# Patient Record
Sex: Female | Born: 1975 | Race: Black or African American | Hispanic: No | Marital: Married | Smoking: Never smoker
Health system: Southern US, Community
[De-identification: ages and names within clinical notes are randomized; demographics above are authoritative.]

## PROBLEM LIST (undated history)

## (undated) DIAGNOSIS — I509 Heart failure, unspecified: Secondary | ICD-10-CM

## (undated) DIAGNOSIS — E785 Hyperlipidemia, unspecified: Secondary | ICD-10-CM

## (undated) DIAGNOSIS — D689 Coagulation defect, unspecified: Secondary | ICD-10-CM

## (undated) DIAGNOSIS — E119 Type 2 diabetes mellitus without complications: Secondary | ICD-10-CM

## (undated) DIAGNOSIS — I428 Other cardiomyopathies: Secondary | ICD-10-CM

## (undated) DIAGNOSIS — F32A Depression, unspecified: Secondary | ICD-10-CM

## (undated) DIAGNOSIS — F329 Major depressive disorder, single episode, unspecified: Secondary | ICD-10-CM

## (undated) DIAGNOSIS — D649 Anemia, unspecified: Secondary | ICD-10-CM

## (undated) HISTORY — DX: Depression, unspecified: F32.A

## (undated) HISTORY — PX: OTHER SURGICAL HISTORY: SHX169

## (undated) HISTORY — DX: Other cardiomyopathies: I42.8

## (undated) HISTORY — DX: Anemia, unspecified: D64.9

## (undated) HISTORY — DX: Heart failure, unspecified: I50.9

## (undated) HISTORY — DX: Major depressive disorder, single episode, unspecified: F32.9

## (undated) HISTORY — DX: Coagulation defect, unspecified: D68.9

## (undated) HISTORY — DX: Hyperlipidemia, unspecified: E78.5

## (undated) HISTORY — PX: CHOLECYSTECTOMY: SHX55

## (undated) HISTORY — DX: Type 2 diabetes mellitus without complications: E11.9

---

## 1998-12-15 ENCOUNTER — Emergency Department (HOSPITAL_COMMUNITY): Admission: EM | Admit: 1998-12-15 | Discharge: 1998-12-16 | Payer: Self-pay | Admitting: Emergency Medicine

## 2000-11-29 ENCOUNTER — Encounter: Admission: RE | Admit: 2000-11-29 | Discharge: 2000-11-29 | Payer: Self-pay | Admitting: *Deleted

## 2000-12-27 ENCOUNTER — Encounter: Payer: Self-pay | Admitting: Surgery

## 2000-12-27 ENCOUNTER — Ambulatory Visit (HOSPITAL_COMMUNITY): Admission: RE | Admit: 2000-12-27 | Discharge: 2000-12-27 | Payer: Self-pay | Admitting: Surgery

## 2001-06-08 ENCOUNTER — Emergency Department (HOSPITAL_COMMUNITY): Admission: EM | Admit: 2001-06-08 | Discharge: 2001-06-08 | Payer: Self-pay | Admitting: Emergency Medicine

## 2001-06-09 ENCOUNTER — Emergency Department (HOSPITAL_COMMUNITY): Admission: EM | Admit: 2001-06-09 | Discharge: 2001-06-09 | Payer: Self-pay | Admitting: Emergency Medicine

## 2001-08-08 ENCOUNTER — Other Ambulatory Visit: Admission: RE | Admit: 2001-08-08 | Discharge: 2001-08-08 | Payer: Self-pay | Admitting: Family Medicine

## 2002-01-02 ENCOUNTER — Encounter: Payer: Self-pay | Admitting: Emergency Medicine

## 2002-01-02 ENCOUNTER — Inpatient Hospital Stay (HOSPITAL_COMMUNITY): Admission: EM | Admit: 2002-01-02 | Discharge: 2002-01-10 | Payer: Self-pay | Admitting: Emergency Medicine

## 2002-01-06 HISTORY — PX: CARDIAC CATHETERIZATION: SHX172

## 2002-01-20 ENCOUNTER — Encounter: Admission: RE | Admit: 2002-01-20 | Discharge: 2002-04-20 | Payer: Self-pay | Admitting: *Deleted

## 2002-01-21 ENCOUNTER — Encounter: Payer: Self-pay | Admitting: Family Medicine

## 2002-01-21 ENCOUNTER — Ambulatory Visit (HOSPITAL_COMMUNITY): Admission: RE | Admit: 2002-01-21 | Discharge: 2002-01-21 | Payer: Self-pay | Admitting: Family Medicine

## 2002-01-22 ENCOUNTER — Inpatient Hospital Stay (HOSPITAL_COMMUNITY): Admission: EM | Admit: 2002-01-22 | Discharge: 2002-01-30 | Payer: Self-pay

## 2002-01-22 ENCOUNTER — Encounter: Payer: Self-pay | Admitting: *Deleted

## 2002-01-23 ENCOUNTER — Encounter: Payer: Self-pay | Admitting: *Deleted

## 2002-01-27 ENCOUNTER — Encounter: Payer: Self-pay | Admitting: *Deleted

## 2002-03-06 ENCOUNTER — Emergency Department (HOSPITAL_COMMUNITY): Admission: EM | Admit: 2002-03-06 | Discharge: 2002-03-06 | Payer: Self-pay | Admitting: Emergency Medicine

## 2002-03-09 ENCOUNTER — Ambulatory Visit (HOSPITAL_COMMUNITY): Admission: RE | Admit: 2002-03-09 | Discharge: 2002-03-09 | Payer: Self-pay | Admitting: Family Medicine

## 2002-03-09 ENCOUNTER — Encounter: Payer: Self-pay | Admitting: Family Medicine

## 2002-11-20 ENCOUNTER — Ambulatory Visit: Admission: RE | Admit: 2002-11-20 | Discharge: 2002-11-20 | Payer: Self-pay | Admitting: Pulmonary Disease

## 2003-01-01 ENCOUNTER — Ambulatory Visit: Admission: RE | Admit: 2003-01-01 | Discharge: 2003-01-01 | Payer: Self-pay | Admitting: Pulmonary Disease

## 2003-03-17 ENCOUNTER — Ambulatory Visit: Admission: RE | Admit: 2003-03-17 | Discharge: 2003-03-17 | Payer: Self-pay | Admitting: Pulmonary Disease

## 2012-12-25 ENCOUNTER — Ambulatory Visit (INDEPENDENT_AMBULATORY_CARE_PROVIDER_SITE_OTHER): Payer: Medicare HMO | Admitting: Internal Medicine

## 2012-12-25 ENCOUNTER — Encounter: Payer: Self-pay | Admitting: Internal Medicine

## 2012-12-25 VITALS — BP 130/80 | HR 108 | Ht 62.0 in | Wt 208.8 lb

## 2012-12-25 DIAGNOSIS — I428 Other cardiomyopathies: Secondary | ICD-10-CM

## 2012-12-25 DIAGNOSIS — I10 Essential (primary) hypertension: Secondary | ICD-10-CM

## 2012-12-25 DIAGNOSIS — I2699 Other pulmonary embolism without acute cor pulmonale: Secondary | ICD-10-CM

## 2012-12-25 DIAGNOSIS — Z79899 Other long term (current) drug therapy: Secondary | ICD-10-CM

## 2012-12-25 DIAGNOSIS — E782 Mixed hyperlipidemia: Secondary | ICD-10-CM

## 2012-12-25 DIAGNOSIS — E119 Type 2 diabetes mellitus without complications: Secondary | ICD-10-CM

## 2012-12-25 MED ORDER — DIGOXIN 250 MCG PO TABS: 0.2500 mg | ORAL_TABLET | Freq: Every day | ORAL | Status: DC

## 2012-12-25 MED ORDER — SPIRONOLACTONE 25 MG PO TABS: 12.5000 mg | ORAL_TABLET | Freq: Every day | ORAL | Status: DC

## 2012-12-25 MED ORDER — CARVEDILOL 25 MG PO TABS: 25.0000 mg | ORAL_TABLET | Freq: Two times a day (BID) | ORAL | Status: DC

## 2012-12-25 MED ORDER — RAMIPRIL 5 MG PO TABS: 5.0000 mg | ORAL_TABLET | Freq: Two times a day (BID) | ORAL | Status: DC

## 2012-12-25 MED ORDER — HYDROCHLOROTHIAZIDE 25 MG PO TABS: 25.0000 mg | ORAL_TABLET | Freq: Every day | ORAL | Status: DC

## 2012-12-25 MED ORDER — EZETIMIBE-SIMVASTATIN 10-40 MG PO TABS: 1.0000 | ORAL_TABLET | Freq: Every day | ORAL | Status: DC

## 2012-12-25 NOTE — Patient Instructions (Signed)
Follow up with Dr. Rennis Golden in 3 weeks.   Have lab work done before visit. Day of lab work, please nothing to eat or drink before and do not take Digoxin prior.

## 2012-12-26 ENCOUNTER — Encounter: Payer: Self-pay | Admitting: Internal Medicine

## 2012-12-26 DIAGNOSIS — I428 Other cardiomyopathies: Secondary | ICD-10-CM | POA: Insufficient documentation

## 2012-12-26 DIAGNOSIS — E669 Obesity, unspecified: Secondary | ICD-10-CM | POA: Insufficient documentation

## 2012-12-26 DIAGNOSIS — I10 Essential (primary) hypertension: Secondary | ICD-10-CM | POA: Insufficient documentation

## 2012-12-26 DIAGNOSIS — I2699 Other pulmonary embolism without acute cor pulmonale: Secondary | ICD-10-CM | POA: Insufficient documentation

## 2012-12-26 NOTE — Progress Notes (Signed)
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 In 2011, her EF was 45% to 50% by echocardiogram with medical therapy. Unfortunately, after her initial diagnosis, she developed a pulmonary embolus in 2003 and was on Coumadin for about 2 years, and now is on aspirin 325 mg daily. She also has diabetes, hypertension, and obesity. She recently she tripped in her house and developed a small stress fracture and sprain of her left ankle which she has recovered from. She denies any shortness of breath with exertion, chest pain, palpitations, presyncope, syncopal symptoms.  PMHx:  Past Medical History  Diagnosis Date  . NICM (nonischemic cardiomyopathy)     2D ECHO, 12/26/2011 - EF 45-50%, left ventricle borderline dilated    Past Surgical History  Procedure Laterality Date  . Cardiac catheterization  01/06/2002    Dilated nonsichemic cardiopmyopathy, EF 28%, at least 2+ angiographic mitral regurgitation    FAMHx:  Family History  Problem Relation Age of Onset  . Hypertension Mother   . GI Bleed Mother   . Pneumonia Maternal Grandmother   . Cancer Paternal Grandmother     Breast cancer    SOCHx:   reports that she has never smoked. She does not have any smokeless tobacco history on file. She reports that she does not drink alcohol or use illicit drugs.  ALLERGIES:  Allergies  Allergen Reactions  . Betadine (Povidone Iodine)   . Iodine   . Shellfish Allergy     ROS: A comprehensive review of systems was negative except for: Cardiovascular: positive for dyspnea  HOME MEDS: Current Outpatient Prescriptions  Medication Sig Dispense Refill  . aspirin 325 MG tablet Take 325 mg by mouth daily.       . carvedilol (COREG) 25 MG tablet Take 1 tablet (25 mg total) by mouth 2 (two) times daily with a meal.  180 tablet  3  . digoxin (LANOXIN) 0.25 MG tablet Take 1 tablet (0.25 mg total) by mouth daily. Takes 1 tablet a day, and alternate with 1/2 tablet every other day.  90 tablet  3  . ezetimibe-simvastatin (VYTORIN) 10-40 MG per tablet Take 1 tablet by mouth at bedtime.  90 tablet  3  . hydrochlorothiazide (HYDRODIURIL) 25 MG tablet Take 1 tablet (25 mg total) by mouth daily.  90 tablet  3  . Liraglutide (VICTOZA ) Inject 1.2 mg into the skin daily.      Marland Kitchen loratadine (CLARITIN) 10 MG tablet Take 10 mg by mouth daily.      . ramipril (ALTACE) 5 MG tablet Take 1 tablet (5 mg total) by mouth 2 (two) times daily.  180 tablet  3  . sitaGLIPtan-metformin (JANUMET) 50-500 MG per tablet Take 1 tablet by mouth 2 (two) times daily with a meal.      . spironolactone (ALDACTONE) 25 MG tablet Take 0.5 tablets (12.5 mg total) by mouth daily.  45 tablet  3   No current facility-administered medications for this visit.    LABS/IMAGING: No results found for this or any previous visit (from the past 48 hour(s)). No results found.  VITALS: BP 130/80  Pulse 108  Ht 5\' 2"  (1.575 m)  Wt 208 lb 12.8 oz (94.711 kg)  BMI 38.18 kg/m2  EXAM: General appearance: alert and no distress Neck: no adenopathy, no carotid bruit, no JVD, supple, symmetrical, trachea midline and thyroid not enlarged, symmetric, no tenderness/mass/nodules Lungs: clear to auscultation bilaterally Heart: regular rate and rhythm, S1, S2 normal, no murmur, click, rub or gallop Abdomen: OBese Extremities: extremities normal, atraumatic, no cyanosis or edema Pulses: 2+ and symmetric Skin: Skin color, texture, turgor normal. No rashes or lesions Neurologic: Grossly normal  EKG: Sinus tachycardia 108  ASSESSMENT: 1. Nonischemic cardiomyopathy, EF 45-50% 2. History pulmonary embolism 3. Diabetes type  2 4. Hypertension 5. Obesity 6. Tachycardia  PLAN: 1.    [name] is doing fairly well except for tachycardia. She's had difficulty with digoxin levels in the past and I would like to recheck that as well as a lipid profile and metabolic profile. I think she needs an increase in her beta blocker. Likely her back to review her laboratory work with her.  Chrystie Nose, MD, [facility] Attending Cardiologist [facility]  Nailea Whitehorn C 12/26/2012, 7:28 PM

## 2013-01-06 LAB — COMPREHENSIVE METABOLIC PANEL
ALT: 9 U/L (ref 0–35)
AST: 12 U/L (ref 0–37)
BUN: 16 mg/dL (ref 6–23)
CO2: 27 mEq/L (ref 19–32)
Creat: 0.98 mg/dL (ref 0.50–1.10)
Total Bilirubin: 0.3 mg/dL (ref 0.3–1.2)

## 2013-01-06 LAB — LIPID PANEL
HDL: 67 mg/dL (ref >39)
Total CHOL/HDL Ratio: 2.4 Ratio
Triglycerides: 123 mg/dL (ref <150)

## 2013-01-07 LAB — DIGOXIN LEVEL: Digoxin Level: 0.6 ng/mL — ABNORMAL LOW (ref 0.8–2.0)

## 2013-01-15 ENCOUNTER — Ambulatory Visit (INDEPENDENT_AMBULATORY_CARE_PROVIDER_SITE_OTHER): Payer: Medicare HMO | Admitting: Internal Medicine

## 2013-01-15 ENCOUNTER — Encounter: Payer: Self-pay | Admitting: Internal Medicine

## 2013-01-15 VITALS — BP 122/82 | HR 88 | Ht 61.0 in | Wt 208.0 lb

## 2013-01-15 DIAGNOSIS — E669 Obesity, unspecified: Secondary | ICD-10-CM

## 2013-01-15 DIAGNOSIS — E119 Type 2 diabetes mellitus without complications: Secondary | ICD-10-CM

## 2013-01-15 DIAGNOSIS — I10 Essential (primary) hypertension: Secondary | ICD-10-CM

## 2013-01-15 DIAGNOSIS — I428 Other cardiomyopathies: Secondary | ICD-10-CM

## 2013-01-15 DIAGNOSIS — I2699 Other pulmonary embolism without acute cor pulmonale: Secondary | ICD-10-CM

## 2013-01-15 NOTE — Progress Notes (Signed)
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 In 2011, her EF was 45% to 50% by echocardiogram with medical therapy. Unfortunately, after her initial diagnosis, she developed a pulmonary embolus in 2003 and was on Coumadin for about 2 years, and now is on aspirin 325 mg daily. She also has diabetes, hypertension, and obesity. She recently she tripped in her house and developed a small stress fracture and sprain of her left ankle which she has recovered from. She denies any shortness of breath with exertion, chest pain, palpitations, presyncope, syncopal symptoms.  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 She continues to feel at baseline. Interestingly, her heart rate is lower than it was at her last office visit.  PMHx:  Past Medical History  Diagnosis Date  . NICM (nonischemic cardiomyopathy)     2D ECHO, 12/26/2011 - EF 45-50%, left ventricle borderline dilated    Past Surgical History  Procedure Laterality Date  . Cardiac catheterization  01/06/2002    Dilated nonsichemic cardiopmyopathy, EF 28%, at least 2+ angiographic mitral regurgitation    FAMHx:  Family History  Problem Relation Age of Onset  . Hypertension Mother   . GI Bleed Mother   . Pneumonia Maternal Grandmother   . Cancer Paternal Grandmother     Breast cancer    SOCHx:   reports that she has never smoked. She does not have any smokeless tobacco history on file. She reports that she does not drink alcohol or use illicit drugs.  ALLERGIES:  Allergies  Allergen Reactions  . Betadine (Povidone Iodine)   . Iodine   . Shellfish Allergy     ROS: A comprehensive review of systems was negative except for:  Cardiovascular: positive for dyspnea  HOME MEDS: Current Outpatient Prescriptions  Medication Sig Dispense Refill  . aspirin 325 MG tablet Take 325 mg by mouth daily.      . carvedilol (COREG) 25 MG tablet Take 1 tablet (25 mg total) by mouth 2 (two) times daily with a meal.  180 tablet  3  . digoxin (LANOXIN) 0.25 MG tablet Take 1 tablet (0.25 mg total) by mouth daily. Takes 1 tablet a day, and alternate with 1/2 tablet every other day.  90 tablet  3  . diphenhydrAMINE (BENADRYL) 25 MG tablet Take 25 mg by mouth daily.      Marland Kitchen ezetimibe-simvastatin (VYTORIN) 10-40 MG per tablet Take 1 tablet by mouth at bedtime.  90 tablet  3  . hydrochlorothiazide (HYDRODIURIL) 25 MG tablet Take 1 tablet (25 mg total) by mouth daily.  90 tablet  3  . Liraglutide (VICTOZA [place]) Inject 1.2 mg into the skin daily.      Marland Kitchen loratadine (CLARITIN) 10 MG tablet Take 10 mg by mouth daily.      . ramipril (ALTACE) 5 MG tablet Take 1 tablet (5 mg total) by mouth 2 (two) times daily.  180 tablet  3  . sitaGLIPtan-metformin (JANUMET) 50-500 MG per tablet Take 1 tablet by mouth 2 (two) times daily with a meal.      . spironolactone (ALDACTONE) 25 MG tablet Take 0.5 tablets (12.5 mg total) by mouth daily.  45 tablet  3   No current facility-administered medications for this visit.  LABS/IMAGING: No results found for this or any previous visit (from the past 48 hour(s)). No results found.  VITALS: BP 122/82  Pulse 88  Ht 5\' 1"  (1.549 m)  Wt 208 lb (94.348 kg)  BMI 39.32 kg/m2  EXAM: General appearance: alert and no distress Neck: no adenopathy, no carotid bruit, no JVD, supple, symmetrical, trachea midline and thyroid not enlarged, symmetric, no tenderness/mass/nodules Lungs: clear to auscultation bilaterally Heart: regular rate and rhythm, S1, S2 normal, no murmur, click, rub or gallop Abdomen: OBese Extremities: extremities normal, atraumatic, no cyanosis or edema Pulses: 2+ and symmetric Skin: Skin color,  texture, turgor normal. No rashes or lesions Neurologic: Grossly normal  EKG: deferred  ASSESSMENT: 1. Nonischemic cardiomyopathy, EF 45-50% 2. History pulmonary embolism 3. Diabetes type 2 4. Hypertension 5. Obesity 6. Tachycardia - improved  PLAN: 1.    Laurie Small tachycardia is improved today. She did undergo laboratory work which shows a well controlled lipid profile. Her digoxin level was 0.6 which is at goal. She did have an elevated blood sugar greater than 260. She tells me that she was recently started on the toes in addition to JB men and is working on dietary changes. Blood sugar control, exercise and weight loss should be her top priorities. Not feel strongly about adjusting her medications today as her heart rate and blood pressure are at goal. We'll plan to see her back yearly or sooner as necessary.  Laurie Nose, MD, [facility] Attending Cardiologist [facility]  Laurie Small 01/15/2013, 11:50 AM

## 2013-01-15 NOTE — Patient Instructions (Signed)
Follow-up annually

## 2013-01-16 ENCOUNTER — Encounter: Payer: Self-pay | Admitting: *Deleted

## 2013-03-18 ENCOUNTER — Other Ambulatory Visit: Payer: Self-pay | Admitting: *Deleted

## 2013-03-18 MED ORDER — EZETIMIBE-SIMVASTATIN 10-40 MG PO TABS: 1.0000 | ORAL_TABLET | Freq: Every day | ORAL | Status: DC

## 2013-03-31 ENCOUNTER — Telehealth: Payer: Self-pay | Admitting: Internal Medicine

## 2013-03-31 NOTE — Telephone Encounter (Signed)
Want to see if she can take something els instead of Vytorin? It is too expensive.

## 2013-03-31 NOTE — Telephone Encounter (Signed)
Message forwarded to Dr. Hilty.  

## 2013-04-01 ENCOUNTER — Other Ambulatory Visit: Payer: Self-pay | Admitting: Internal Medicine

## 2013-04-01 DIAGNOSIS — E785 Hyperlipidemia, unspecified: Secondary | ICD-10-CM

## 2013-04-01 MED ORDER — ATORVASTATIN CALCIUM 40 MG PO TABS: 40.0000 mg | ORAL_TABLET | Freq: Every day | ORAL | Status: DC

## 2013-04-01 NOTE — Telephone Encounter (Signed)
Spoke with patient today - will switch to atorvastatin 40 mg.  Dr. Rennis Golden

## 2013-04-01 NOTE — Telephone Encounter (Signed)
Message forwarded to Dr. Hilty.  

## 2013-04-01 NOTE — Telephone Encounter (Signed)
Pt said she called yesterday-never heard from anybody-please call today.

## 2014-02-05 ENCOUNTER — Other Ambulatory Visit: Payer: Self-pay | Admitting: Internal Medicine

## 2014-02-05 NOTE — Telephone Encounter (Signed)
Rx was sent to pharmacy electronically. 

## 2014-02-18 ENCOUNTER — Encounter: Payer: Self-pay | Admitting: Internal Medicine

## 2014-02-18 ENCOUNTER — Ambulatory Visit (INDEPENDENT_AMBULATORY_CARE_PROVIDER_SITE_OTHER): Payer: Medicare HMO | Admitting: Internal Medicine

## 2014-02-18 VITALS — BP 142/80 | HR 92 | Ht 62.0 in | Wt 204.4 lb

## 2014-02-18 DIAGNOSIS — I10 Essential (primary) hypertension: Secondary | ICD-10-CM

## 2014-02-18 DIAGNOSIS — Z79899 Other long term (current) drug therapy: Secondary | ICD-10-CM

## 2014-02-18 DIAGNOSIS — E119 Type 2 diabetes mellitus without complications: Secondary | ICD-10-CM

## 2014-02-18 DIAGNOSIS — E785 Hyperlipidemia, unspecified: Secondary | ICD-10-CM

## 2014-02-18 DIAGNOSIS — I428 Other cardiomyopathies: Secondary | ICD-10-CM

## 2014-02-18 DIAGNOSIS — E669 Obesity, unspecified: Secondary | ICD-10-CM

## 2014-02-18 MED ORDER — RAMIPRIL 5 MG PO CAPS: 5.0000 mg | ORAL_CAPSULE | Freq: Two times a day (BID) | ORAL | Status: DC

## 2014-02-18 MED ORDER — CARVEDILOL 25 MG PO TABS: 25.0000 mg | ORAL_TABLET | Freq: Two times a day (BID) | ORAL | Status: DC

## 2014-02-18 MED ORDER — DIGOXIN 250 MCG PO TABS: 0.2500 mg | ORAL_TABLET | Freq: Every day | ORAL | Status: DC

## 2014-02-18 MED ORDER — SPIRONOLACTONE 25 MG PO TABS: 12.5000 mg | ORAL_TABLET | Freq: Every day | ORAL | Status: DC

## 2014-02-18 MED ORDER — DIGOXIN 250 MCG PO TABS: ORAL_TABLET | ORAL | Status: DC

## 2014-02-18 MED ORDER — HYDROCHLOROTHIAZIDE 25 MG PO TABS: 25.0000 mg | ORAL_TABLET | Freq: Every day | ORAL | Status: DC

## 2014-02-18 MED ORDER — ATORVASTATIN CALCIUM 40 MG PO TABS: 40.0000 mg | ORAL_TABLET | Freq: Every day | ORAL | Status: DC

## 2014-02-18 NOTE — Patient Instructions (Signed)
Your physician has requested that you have an echocardiogram. Echocardiography is a painless test that uses sound waves to create images of your heart. It provides your doctor with information about the size and shape of your heart and how well your heart's chambers and valves are working. This procedure takes approximately one hour. There are no restrictions for this procedure.  Your physician recommends that you return for lab work at your convenience.   Your physician wants you to follow-up in: 6 months. You will receive a reminder letter in the mail two months in advance. If you don't receive a letter, please call our office to schedule the follow-up appointment.

## 2014-02-19 ENCOUNTER — Encounter: Payer: Self-pay | Admitting: Internal Medicine

## 2014-02-19 NOTE — Progress Notes (Signed)
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 In 2011, her EF was 45% to 50% by echocardiogram with medical therapy. Unfortunately, after her initial diagnosis, she developed a pulmonary embolus in 2003 and was on Coumadin for about 2 years, and now is on aspirin 325 mg daily. She also has diabetes, hypertension, and obesity. She recently she tripped in her house and developed a small stress fracture and sprain of her left ankle which she has recovered from. She denies any shortness of breath with exertion, chest pain, palpitations, presyncope, syncopal symptoms.  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 She continues to feel at baseline. Interestingly, her heart rate is lower than it was at her last office visit.  Laurie Small returns today for followup. She reports doing well denies any shortness of breath or chest pain with exertion. Unfortunately she's not been able to lose any significant amount of weight. She has had some adjustments in her diabetes medicine with the addition of glipizide, she is also taking Onglyza and metformin 1000 mg twice daily.  She is interested in possibly getting off of some of her medications.  PMHx:  Past Medical History  Diagnosis Date  . NICM (nonischemic cardiomyopathy)     2D ECHO, 12/26/2011 - EF 45-50%, left ventricle borderline dilated    Past Surgical History  Procedure Laterality Date  . Cardiac catheterization  01/06/2002    Dilated nonsichemic cardiopmyopathy, EF 28%, at least 2+ angiographic mitral regurgitation    FAMHx:  Family History  Problem Relation Age of Onset  . Hypertension Mother   . GI Bleed Mother   .  Pneumonia Maternal Grandmother   . Cancer Paternal Grandmother     Breast cancer    SOCHx:   reports that she has never smoked. She does not have any smokeless tobacco history on file. She reports that she does not drink alcohol or use illicit drugs.  ALLERGIES:  Allergies  Allergen Reactions  . Betadine [Povidone Iodine]   . Iodine   . Shellfish Allergy   . Other Hives and Rash    Sunlight and Grass     ROS: A comprehensive review of systems was negative except for: Cardiovascular: positive for dyspnea  HOME MEDS: Current Outpatient Prescriptions  Medication Sig Dispense Refill  . aspirin 325 MG tablet Take 325 mg by mouth daily.      Marland Kitchen. atorvastatin (LIPITOR) 40 MG tablet Take 1 tablet (40 mg total) by mouth at bedtime.  90 tablet  3  . carvedilol (COREG) 25 MG tablet Take 1 tablet (25 mg total) by mouth 2 (two) times daily with a meal.  180 tablet  3  . digoxin (LANOXIN) 0.25 MG tablet Takes 1 tablet by mouth daily and alternate with 1/2 tablet every other day.  90 tablet  3  . diphenhydrAMINE (BENADRYL) 25 MG tablet Take 25 mg by mouth daily.      Marland Kitchen. glipiZIDE (GLUCOTROL XL) 10 MG 24 hr tablet Take 1 tablet by mouth daily.      . hydrochlorothiazide (HYDRODIURIL) 25 MG tablet Take 1 tablet (25 mg total) by mouth daily.  90 tablet  3  . hydrocortisone 2.5 % cream as needed.      .Marland Kitchen  loratadine (CLARITIN) 10 MG tablet Take 10 mg by mouth daily.      . ONGLYZA 5 MG TABS tablet Take 1 tablet by mouth daily.      . ramipril (ALTACE) 5 MG capsule Take 1 capsule (5 mg total) by mouth 2 (two) times daily.  180 capsule  3  . sitaGLIPtan-metformin (JANUMET) 50-500 MG per tablet Take 1 tablet by mouth 2 (two) times daily with a meal.      . spironolactone (ALDACTONE) 25 MG tablet Take 0.5 tablets (12.5 mg total) by mouth daily.  45 tablet  3   No current facility-administered medications for this visit.    LABS/IMAGING: No results found for this or any previous visit (from the past 48  hour(s)). No results found.  VITALS: BP 142/80  Pulse 92  Ht 5\' 2"  (1.575 m)  Wt 204 lb 6.4 oz (92.715 kg)  BMI 37.38 kg/m2  EXAM: General appearance: alert and no distress Neck: no adenopathy, no carotid bruit, no JVD, supple, symmetrical, trachea midline and thyroid not enlarged, symmetric, no tenderness/mass/nodules Lungs: clear to auscultation bilaterally Heart: regular rate and rhythm, S1, S2 normal, no murmur, click, rub or gallop Abdomen: OBese Extremities: extremities normal, atraumatic, no cyanosis or edema Pulses: 2+ and symmetric Skin: Skin color, texture, turgor normal. No rashes or lesions Neurologic: Grossly normal  EKG: Normal sinus rhythm at 92, incomplete right bundle branch block and nonspecific T-wave changes  ASSESSMENT: 1. Nonischemic cardiomyopathy, EF 45-50% (2013) 2. History pulmonary embolism 3. Diabetes type 2 - controlled 4. Hypertension 5. Obesity 6. Tachycardia - improved  PLAN: 1.    [name] appears to be well compensated. She continues to have an elevated a slight heart rate despite beta-blockade. She is interested in possibly coming off medications however I am leery to do that as her EF has improved up to 45-50% from back at 25% a number of years ago. She is due for recheck echocardiogram and I like to repeat that as her last study was in 2013 to make sure that her EF is stable. I would also recommend a metabolic profile and digoxin level to make sure she is therapeutic and not over treated with this medication. If there were any medication that could be stopped with regards to her heart failure, digoxin would be a possible medication to stop, however and has attended she's done very well. I'll contact her with results of echocardiogram and otherwise plan to see her back annually.  Chrystie Nose, MD, [facility] Attending Cardiologist [facility]  HILTY,Kenneth C 02/19/2014, 9:45 AM

## 2014-03-03 ENCOUNTER — Ambulatory Visit (HOSPITAL_COMMUNITY)
Admission: RE | Admit: 2014-03-03 | Discharge: 2014-03-03 | Disposition: A | Discharge status: Home / Self Care | Payer: Medicare HMO | Source: Ambulatory Visit | Attending: Cardiology | Admitting: Cardiology

## 2014-03-03 DIAGNOSIS — I059 Rheumatic mitral valve disease, unspecified: Secondary | ICD-10-CM | POA: Diagnosis not present

## 2014-03-03 DIAGNOSIS — I5189 Other ill-defined heart diseases: Secondary | ICD-10-CM | POA: Insufficient documentation

## 2014-03-03 DIAGNOSIS — I428 Other cardiomyopathies: Secondary | ICD-10-CM | POA: Diagnosis present

## 2014-03-03 DIAGNOSIS — I10 Essential (primary) hypertension: Secondary | ICD-10-CM | POA: Insufficient documentation

## 2014-03-03 DIAGNOSIS — I319 Disease of pericardium, unspecified: Secondary | ICD-10-CM | POA: Diagnosis not present

## 2014-03-03 DIAGNOSIS — I517 Cardiomegaly: Secondary | ICD-10-CM | POA: Insufficient documentation

## 2014-03-03 DIAGNOSIS — E119 Type 2 diabetes mellitus without complications: Secondary | ICD-10-CM | POA: Diagnosis not present

## 2014-03-03 DIAGNOSIS — Z86711 Personal history of pulmonary embolism: Secondary | ICD-10-CM | POA: Diagnosis not present

## 2014-03-03 NOTE — Progress Notes (Signed)
2D Echo Performed 03/03/2014    Whit Bruni, RCS  

## 2014-03-10 ENCOUNTER — Other Ambulatory Visit: Payer: Self-pay | Admitting: Internal Medicine

## 2014-03-10 NOTE — Telephone Encounter (Signed)
Refilled #90 tablets with 3 refills on 02/18/2014

## 2014-04-24 ENCOUNTER — Other Ambulatory Visit: Payer: Self-pay | Admitting: Family Medicine

## 2014-04-24 DIAGNOSIS — Z1231 Encounter for screening mammogram for malignant neoplasm of breast: Secondary | ICD-10-CM

## 2014-05-19 ENCOUNTER — Other Ambulatory Visit: Payer: Self-pay | Admitting: Internal Medicine

## 2014-05-19 NOTE — Telephone Encounter (Signed)
Refilled #180 capsules with 3 refills on 02/18/2014

## 2014-05-29 ENCOUNTER — Ambulatory Visit: Payer: Medicare HMO

## 2014-05-29 ENCOUNTER — Other Ambulatory Visit (HOSPITAL_COMMUNITY)
Admission: RE | Admit: 2014-05-29 | Discharge: 2014-05-29 | Disposition: A | Discharge status: Home / Self Care | Payer: Medicare HMO | Source: Ambulatory Visit | Attending: Family Medicine | Admitting: Family Medicine

## 2014-05-29 ENCOUNTER — Other Ambulatory Visit: Payer: Self-pay | Admitting: Family Medicine

## 2014-05-29 DIAGNOSIS — Z0000 Encounter for general adult medical examination without abnormal findings: Secondary | ICD-10-CM | POA: Insufficient documentation

## 2014-06-02 LAB — CYTOLOGY - PAP

## 2014-06-04 ENCOUNTER — Ambulatory Visit
Admission: RE | Admit: 2014-06-04 | Discharge: 2014-06-04 | Disposition: A | Discharge status: Home / Self Care | Payer: Medicare HMO | Source: Ambulatory Visit | Attending: Family Medicine | Admitting: Family Medicine

## 2014-06-04 DIAGNOSIS — Z1231 Encounter for screening mammogram for malignant neoplasm of breast: Secondary | ICD-10-CM

## 2014-09-14 ENCOUNTER — Other Ambulatory Visit: Payer: Self-pay

## 2014-09-14 MED ORDER — HYDROCHLOROTHIAZIDE 25 MG PO TABS: 25.0000 mg | ORAL_TABLET | Freq: Every day | ORAL | Status: DC

## 2014-09-14 NOTE — Telephone Encounter (Signed)
Rx(s) sent to pharmacy electronically.  

## 2015-02-19 ENCOUNTER — Other Ambulatory Visit: Payer: Self-pay | Admitting: Internal Medicine

## 2015-02-19 NOTE — Telephone Encounter (Signed)
REFILL 

## 2015-02-25 ENCOUNTER — Other Ambulatory Visit: Payer: Self-pay | Admitting: Internal Medicine

## 2015-02-26 NOTE — Telephone Encounter (Signed)
Rx(s) sent to pharmacy electronically.  

## 2015-04-20 ENCOUNTER — Ambulatory Visit (INDEPENDENT_AMBULATORY_CARE_PROVIDER_SITE_OTHER): Payer: 59 | Admitting: Internal Medicine

## 2015-04-20 VITALS — BP 142/70 | HR 82 | Ht 62.0 in | Wt 202.3 lb

## 2015-04-20 DIAGNOSIS — I429 Cardiomyopathy, unspecified: Secondary | ICD-10-CM

## 2015-04-20 DIAGNOSIS — I10 Essential (primary) hypertension: Secondary | ICD-10-CM

## 2015-04-20 DIAGNOSIS — E1159 Type 2 diabetes mellitus with other circulatory complications: Secondary | ICD-10-CM | POA: Diagnosis not present

## 2015-04-20 DIAGNOSIS — I428 Other cardiomyopathies: Secondary | ICD-10-CM

## 2015-04-20 DIAGNOSIS — E669 Obesity, unspecified: Secondary | ICD-10-CM

## 2015-04-20 MED ORDER — ATORVASTATIN CALCIUM 40 MG PO TABS: 40.0000 mg | ORAL_TABLET | Freq: Every day | ORAL | Status: DC

## 2015-04-20 MED ORDER — HYDROCHLOROTHIAZIDE 25 MG PO TABS: 25.0000 mg | ORAL_TABLET | Freq: Every day | ORAL | Status: DC

## 2015-04-20 MED ORDER — RAMIPRIL 5 MG PO CAPS: 5.0000 mg | ORAL_CAPSULE | Freq: Two times a day (BID) | ORAL | Status: DC

## 2015-04-20 MED ORDER — DIGOXIN 250 MCG PO TABS: ORAL_TABLET | ORAL | Status: DC

## 2015-04-20 MED ORDER — SPIRONOLACTONE 25 MG PO TABS: ORAL_TABLET | ORAL | Status: DC

## 2015-04-20 MED ORDER — CARVEDILOL 25 MG PO TABS: 25.0000 mg | ORAL_TABLET | Freq: Two times a day (BID) | ORAL | Status: DC

## 2015-04-20 NOTE — Patient Instructions (Signed)

## 2015-04-21 ENCOUNTER — Other Ambulatory Visit: Payer: Self-pay | Admitting: Internal Medicine

## 2015-04-21 ENCOUNTER — Encounter: Payer: Self-pay | Admitting: Internal Medicine

## 2015-04-21 DIAGNOSIS — I5022 Chronic systolic (congestive) heart failure: Secondary | ICD-10-CM

## 2015-04-21 MED ORDER — RAMIPRIL 5 MG PO CAPS: ORAL_CAPSULE | ORAL | Status: DC

## 2015-04-21 NOTE — Progress Notes (Signed)
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 In 2011, her EF was 45% to 50% by echocardiogram with medical therapy. Unfortunately, after her initial diagnosis, she developed a pulmonary embolus in 2003 and was on Coumadin for about 2 years, and now is on aspirin 325 mg daily. She also has diabetes, hypertension, and obesity. She recently she tripped in her house and developed a small stress fracture and sprain of her left ankle which she has recovered from. She denies any shortness of breath with exertion, chest pain, palpitations, presyncope, syncopal symptoms.  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 She continues to feel at baseline. Interestingly, her heart rate is lower than it was at her last office visit.  [name] returns today for followup. She reports doing well denies any shortness of breath or chest pain with exertion. Unfortunately she's not been able to lose any significant amount of weight. She has had some adjustments in her diabetes medicine with the addition of glipizide, she is also taking Onglyza and metformin 1000 mg twice daily.  She is interested in possibly getting off of some of her medications.  Saw [name] back in the office today. Overall she is doing well without any symptoms of heart failure. Blood sugar has been better controlled. Weight appears to be down just a few pounds. EKG looks normal today. Her last echocardiogram however did not show significant improvement in EF, therefore I recommended we remain on her current heart failure medications. Currently she has NYHA class I symptoms.  PMHx:  Past Medical History  Diagnosis  Date  . NICM (nonischemic cardiomyopathy)     2D ECHO, 12/26/2011 - EF 45-50%, left ventricle borderline dilated    Past Surgical History  Procedure Laterality Date  . Cardiac catheterization  01/06/2002    Dilated nonsichemic cardiopmyopathy, EF 28%, at least 2+ angiographic mitral regurgitation    FAMHx:  Family History  Problem Relation Age of Onset  . Hypertension Mother   . GI Bleed Mother   . Pneumonia Maternal Grandmother   . Cancer Paternal Grandmother     Breast cancer    SOCHx:   reports that she has never smoked. She does not have any smokeless tobacco history on file. She reports that she does not drink alcohol or use illicit drugs.  ALLERGIES:  Allergies  Allergen Reactions  . Betadine [Povidone Iodine]   . Iodine   . Shellfish Allergy   . Other Hives and Rash    Sunlight and Grass     ROS: A comprehensive review of systems was negative.  HOME MEDS: Current Outpatient Prescriptions  Medication Sig Dispense Refill  . aspirin 325 MG tablet Take 325 mg by mouth daily.    Marland Kitchen atorvastatin (LIPITOR) 40 MG tablet Take 1 tablet (40 mg total) by mouth daily at 6 PM. 90 tablet 3  . carvedilol (COREG) 25 MG tablet Take 1 tablet (25 mg total) by mouth 2 (two) times daily with a meal. 180 tablet 3  . digoxin (LANOXIN) 0.25 MG tablet Takes 1 tablets by mouth daily and alternate with 1/2 tablet every other day 90 tablet 3  . diphenhydrAMINE (BENADRYL) 25 MG tablet Take 25 mg by mouth daily.    Marland Kitchen  FARXIGA 5 MG TABS tablet Take 1 tablet by mouth daily.    Marland Kitchen glipiZIDE (GLUCOTROL XL) 10 MG 24 hr tablet Take 1 tablet by mouth daily.    . hydrochlorothiazide (HYDRODIURIL) 25 MG tablet Take 1 tablet (25 mg total) by mouth daily. 90 tablet 1  . hydrocortisone 2.5 % cream as needed.    . loratadine (CLARITIN) 10 MG tablet Take 10 mg by mouth daily.    . metFORMIN (GLUCOPHAGE) 1000 MG tablet Take 1 tablet by mouth daily.  0  . ramipril (ALTACE) 5 MG capsule Take 1 capsule (5 mg  total) by mouth 2 (two) times daily. 180 capsule 3  . sitaGLIPtan-metformin (JANUMET) 50-500 MG per tablet Take 1 tablet by mouth 2 (two) times daily with a meal.    . spironolactone (ALDACTONE) 25 MG tablet TAKE 0.5 TABLETS (12.5 MG TOTAL) BY MOUTH DAILY. 45 tablet 3   No current facility-administered medications for this visit.    LABS/IMAGING: No results found for this or any previous visit (from the past 48 hour(s)). No results found.  VITALS: BP 142/70 mmHg  Pulse 82  Ht  (1.575 m)  Wt 202 lb 4.8 oz (91.763 kg)  BMI 36.99 kg/m2  EXAM: General appearance: alert and no distress Neck: no adenopathy, no carotid bruit, no JVD, supple, symmetrical, trachea midline and thyroid not enlarged, symmetric, no tenderness/mass/nodules Lungs: clear to auscultation bilaterally Heart: regular rate and rhythm, S1, S2 normal, no murmur, click, rub or gallop Abdomen: OBese Extremities: extremities normal, atraumatic, no cyanosis or edema Pulses: 2+ and symmetric Skin: Skin color, texture, turgor normal. No rashes or lesions Neurologic: Grossly normal  EKG: Normal sinus rhythm at 82, incomplete right bundle branch block and left anterior fascicular block. Mild left axis deviation  ASSESSMENT: 1. Nonischemic cardiomyopathy, EF 40-45% (2015) - NYHA class I symptoms 2. History pulmonary embolism 3. Diabetes type 2 - controlled 4. Hypertension - controlled 5. Obesity  PLAN: 1.    [name] appears to be well compensated. Blood pressures at goal. Her diabetes is well improved. Weight is fairly stable but down a couple of pounds. EF unfortunately has declined slightly to 40-45%. She will need to remain on heart failure medications. We could consider up titrating this up blood pressure and heart rate allow that. She may be able tolerate increase in her altace to 15 milligrams daily. Plan to see her back annually or sooner as necessary.  Chrystie Nose, MD, [facility] Attending  Cardiologist [facility]   Chrystie Nose 04/21/2015, 1:51 PM

## 2015-05-09 ENCOUNTER — Other Ambulatory Visit: Payer: Self-pay | Admitting: Internal Medicine

## 2015-05-12 ENCOUNTER — Other Ambulatory Visit: Payer: Self-pay

## 2015-05-12 DIAGNOSIS — Z1231 Encounter for screening mammogram for malignant neoplasm of breast: Secondary | ICD-10-CM

## 2015-05-25 ENCOUNTER — Other Ambulatory Visit: Payer: Self-pay | Admitting: Internal Medicine

## 2015-05-31 ENCOUNTER — Other Ambulatory Visit: Payer: Self-pay | Admitting: Family Medicine

## 2015-05-31 ENCOUNTER — Other Ambulatory Visit (HOSPITAL_COMMUNITY)
Admission: RE | Admit: 2015-05-31 | Discharge: 2015-05-31 | Disposition: A | Discharge status: Home / Self Care | Payer: Managed Care, Other (non HMO) | Source: Ambulatory Visit | Attending: Family Medicine | Admitting: Family Medicine

## 2015-05-31 DIAGNOSIS — Z01419 Encounter for gynecological examination (general) (routine) without abnormal findings: Secondary | ICD-10-CM | POA: Insufficient documentation

## 2015-06-02 LAB — CYTOLOGY - PAP

## 2015-06-07 ENCOUNTER — Ambulatory Visit
Admission: RE | Admit: 2015-06-07 | Discharge: 2015-06-07 | Disposition: A | Discharge status: Home / Self Care | Payer: 59 | Source: Ambulatory Visit

## 2015-06-07 DIAGNOSIS — Z1231 Encounter for screening mammogram for malignant neoplasm of breast: Secondary | ICD-10-CM

## 2015-07-20 ENCOUNTER — Ambulatory Visit (INDEPENDENT_AMBULATORY_CARE_PROVIDER_SITE_OTHER): Payer: 59 | Admitting: Internal Medicine

## 2015-07-20 ENCOUNTER — Encounter: Payer: Self-pay | Admitting: Internal Medicine

## 2015-07-20 VITALS — BP 104/68 | HR 84 | Temp 98.0°F | Resp 12 | Ht 64.0 in | Wt 198.0 lb

## 2015-07-20 DIAGNOSIS — E1129 Type 2 diabetes mellitus with other diabetic kidney complication: Secondary | ICD-10-CM | POA: Diagnosis not present

## 2015-07-20 DIAGNOSIS — R809 Proteinuria, unspecified: Secondary | ICD-10-CM | POA: Diagnosis not present

## 2015-07-20 MED ORDER — DAPAGLIFLOZIN PROPANEDIOL 10 MG PO TABS: 10.0000 mg | ORAL_TABLET | Freq: Every day | ORAL | Status: DC

## 2015-07-20 MED ORDER — SITAGLIPTIN PHOSPHATE 100 MG PO TABS: 100.0000 mg | ORAL_TABLET | Freq: Every day | ORAL | Status: DC

## 2015-07-20 NOTE — Patient Instructions (Addendum)
Please continue Metformin 1000 mg 2x a day with meals. Increase Farxiga to 10 mg in am. Add Januvia 100 mg in am. Move Glipizide ER 10 mg to am.  Please let me know if the sugars are consistently <80 or >200.  Please schedule an appt with Oran Rein with nutrition.  Please return in 1.5 months with your sugar log.   PATIENT INSTRUCTIONS FOR TYPE 2 DIABETES:  **Please join MyChart!** - see attached instructions about how to join if you have not done so already.  DIET AND EXERCISE Diet and exercise is an important part of diabetic treatment.  We recommended aerobic exercise in the form of brisk walking (working between 40-60% of maximal aerobic capacity, similar to brisk walking) for 150 minutes per week (such as 30 minutes five days per week) along with 3 times per week performing 'resistance' training (using various gauge rubber tubes with handles) 5-10 exercises involving the major muscle groups (upper body, lower body and core) performing 10-15 repetitions (or near fatigue) each exercise. Start at half the above goal but build slowly to reach the above goals. If limited by weight, joint pain, or disability, we recommend daily walking in a swimming pool with water up to waist to reduce pressure from joints while allow for adequate exercise.    BLOOD GLUCOSES Monitoring your blood glucoses is important for continued management of your diabetes. Please check your blood glucoses 2-4 times a day: fasting, before meals and at bedtime (you can rotate these measurements - e.g. one day check before the 3 meals, the next day check before 2 of the meals and before bedtime, etc.).   HYPOGLYCEMIA (low blood sugar) Hypoglycemia is usually a reaction to not eating, exercising, or taking too much insulin/ other diabetes drugs.  Symptoms include tremors, sweating, hunger, confusion, headache, etc. Treat IMMEDIATELY with 15 grams of Carbs: . 4 glucose tablets .  cup regular juice/soda . 2 tablespoons  raisins . 4 teaspoons sugar . 1 tablespoon honey Recheck blood glucose in 15 mins and repeat above if still symptomatic/blood glucose <100.  RECOMMENDATIONS TO REDUCE YOUR RISK OF DIABETIC COMPLICATIONS: * Take your prescribed MEDICATION(S) * Follow a DIABETIC diet: Complex carbs, fiber rich foods, (monounsaturated and polyunsaturated) fats * AVOID saturated/trans fats, high fat foods, >2,300 mg salt per day. * EXERCISE at least 5 times a week for 30 minutes or preferably daily.  * DO NOT SMOKE OR DRINK more than 1 drink a day. * Check your FEET every day. Do not wear tightfitting shoes. Contact us if you develop an ulcer * See your EYE doctor once a year or more if needed * Get a FLU shot once a year * Get a PNEUMONIA vaccine once before and once after age 75 years  GOALS:  * Your Hemoglobin A1c of <7%  * fasting sugars need to be <130 * after meals sugars need to be <180 (2h after you start eating) * Your Systolic BP should be 140 or lower  * Your Diastolic BP should be 80 or lower  * Your HDL (Good Cholesterol) should be 40 or higher  * Your LDL (Bad Cholesterol) should be 100 or lower. * Your Triglycerides should be 150 or lower  * Your Urine microalbumin (kidney function) should be <30 * Your Body Mass Index should be 25 or lower    Please consider the following ways to cut down carbs and fat and increase fiber and micronutrients in your diet: - substitute whole grain for white  bread or pasta - substitute brown rice for white rice - substitute 90-calorie flat bread pieces for slices of bread when possible - substitute sweet potatoes or yams for white potatoes - substitute humus for margarine - substitute tofu for cheese when possible - substitute almond or rice milk for regular milk (would not drink soy milk daily due to concern for soy estrogen influence on breast cancer risk) - substitute dark chocolate for other sweets when possible - substitute water - can add lemon or  orange slices for taste - for diet sodas (artificial sweeteners will trick your body that you can eat sweets without getting calories and will lead you to overeating and weight gain in the long run) - do not skip breakfast or other meals (this will slow down the metabolism and will result in more weight gain over time)  - can try smoothies made from fruit and almond/rice milk in am instead of regular breakfast - can also try old-fashioned (not instant) oatmeal made with almond/rice milk in am - order the dressing on the side when eating salad at a restaurant (pour less than half of the dressing on the salad) - eat as little meat as possible - can try juicing, but should not forget that juicing will get rid of the fiber, so would alternate with eating raw veg./fruits or drinking smoothies - use as little oil as possible, even when using olive oil - can dress a salad with a mix of balsamic vinegar and lemon juice, for e.g. - use agave nectar, stevia sugar, or regular sugar rather than artificial sweateners - steam or broil/roast veggies  - snack on veggies/fruit/nuts (unsalted, preferably) when possible, rather than processed foods - reduce or eliminate aspartame in diet (it is in diet sodas, chewing gum, etc) Read the labels!  Try to read Dr. Janene Harvey book: "Program for Reversing Diabetes" for other ideas for healthy eating.

## 2015-07-20 NOTE — Progress Notes (Addendum)
Patient ID: Laurie Small, female   DOB: 1975-11-10, 39 y.o.   MRN: [id number]  HPI: Laurie Small is a 39 y.o.-year-old female, referred by her PCP, Dr.Fulp, for management of DM2, dx in 2003, non-insulin-dependent, uncontrolled, with complications (microalbuminuria).  She was dx with DM2 in 2003 when she had myocarditis.  Last hemoglobin A1c was: 05/31/2015: HbA1c 10% 09/15/2014: HbA1c 9.6% 04/23/2014: HbA1c 10.9% 01/20/2014: HbA1c 12.9%  Pt is on a regimen of: - Metformin 1000 mg 2x a day, with meals - Glipizide ER XL 10 mg before dinner - Farxiga 5 mg in am She was on Onglyza (too expensive).  She was on JanuMet (stopped being effective). She was on Victoza (trial period). She refuses insulin.  Pt checks her sugars 1x a day and they are: - am: 104-160 - 2h after b'fast: n/c - before lunch: n/c - 2h after lunch: n/c - before dinner: n/c - 2h after dinner: n/c - bedtime: n/c - nighttime: n/c No lows. Lowest sugar was 96; ? hypoglycemia awareness.  Highest sugar was 242.  Glucometer: FreeStyle Ultra mini  Pt's meals are: - Breakfast: fruit, yoghurt, oatmeal, eggs, grits, bacon - Lunch: pasta, sandwich, salad - Dinner: meat + veggies - Snacks: 3  She works from home.  She walks for exercise.  - no CKD, last BUN/creatinine:  05/31/2015: 13/0.91 Lab Results  Component Value Date   BUN 16 01/06/2013   CREATININE 0.98 01/06/2013  ACR  (09/15/2014): 44.4 ACR (01/20/2014): 79.9 On Ramipril. - last set of lipids: 06/12/2014: 147/118/44/80 Lab Results  Component Value Date   CHOL 158 01/06/2013   HDL 67 01/06/2013   LDLCALC 66 01/06/2013   TRIG 123 01/06/2013   CHOLHDL 2.4 01/06/2013  On Lipitor. - last eye exam was in Winter 2014. No DR.  - no numbness and tingling in her feet. Heel spurs.  Pt has no FH of DM.  She also has a history of hypertension, hyperlipidemia, history of pulmonary embolism. She has had parathyroid surgery in  2001.  ROS: Constitutional: + Both: weight gain/loss, + fatigue, + hot flashes Eyes: no blurry vision, no xerophthalmia ENT: + sore throat, no nodules palpated in throat, no dysphagia/odynophagia, no hoarseness Cardiovascular: no CP/+ SOB/+ palpitations/+ leg swelling Respiratory: no cough/+ SOB Gastrointestinal: no N/V/+ D/+ C/+ heartburn Musculoskeletal: no muscle/joint aches Skin: + Rash, + itching Neurological: no tremors/numbness/tingling/dizziness, + headache Psychiatric: no depression/anxiety  Past Medical History  Diagnosis Date  . NICM (nonischemic cardiomyopathy) (HCC)     2D ECHO, 12/26/2011 - EF 45-50%, left ventricle borderline dilated   Past Surgical History  Procedure Laterality Date  . Cardiac catheterization  01/06/2002    Dilated nonsichemic cardiopmyopathy, EF 28%, at least 2+ angiographic mitral regurgitation   Social History   Social History  . Marital Status: Married    Spouse Name: N/A  . Number of Children: 1   Occupational History  .  claims benefits specialist    Social History Main Topics  . Smoking status: Never Smoker   . Smokeless tobacco: Not on file  . Alcohol Use: No  . Drug Use: No   Current Outpatient Prescriptions on File Prior to Visit  Medication Sig Dispense Refill  . aspirin 325 MG tablet Take 325 mg by mouth daily.    Marland Kitchen atorvastatin (LIPITOR) 40 MG tablet Take 1 tablet (40 mg total) by mouth daily at 6 PM. 90 tablet 3  . carvedilol (COREG) 25 MG tablet Take 1 tablet (25 mg total) by mouth 2 (two)  times daily with a meal. 180 tablet 3  . digoxin (LANOXIN) 0.25 MG tablet Takes 1 tablets by mouth daily and alternate with 1/2 tablet every other day 90 tablet 3  . diphenhydrAMINE (BENADRYL) 25 MG tablet Take 25 mg by mouth daily.    Marland Kitchen FARXIGA 5 MG TABS tablet Take 1 tablet by mouth daily.    Marland Kitchen glipiZIDE (GLUCOTROL XL) 10 MG 24 hr tablet Take 1 tablet by mouth daily.    . hydrochlorothiazide (HYDRODIURIL) 25 MG tablet Take 1 tablet (25  mg total) by mouth daily. 90 tablet 1  . hydrocortisone 2.5 % cream as needed.    . loratadine (CLARITIN) 10 MG tablet Take 10 mg by mouth daily.    . metFORMIN (GLUCOPHAGE) 1000 MG tablet Take 1 tablet by mouth 2 (two) times daily with a meal.   0  . ramipril (ALTACE) 5 MG capsule Take 2 tablets (10 mg total) by mouth in the morning and 1 tablet (5 mg) in the evening 270 capsule 3  . spironolactone (ALDACTONE) 25 MG tablet TAKE 0.5 TABLETS (12.5 MG TOTAL) BY MOUTH DAILY. 45 tablet 3   No current facility-administered medications on file prior to visit.   Allergies  Allergen Reactions  . Betadine [Povidone Iodine]   . Iodine   . Shellfish Allergy   . Other Hives and Rash    Sunlight and Grass    Family History  Problem Relation Age of Onset  . Hypertension Mother   . GI Bleed Mother   . Pneumonia Maternal Grandmother   . Cancer Paternal Grandmother     Breast cancer   PE: BP 104/68 mmHg  Pulse 84  Temp(Src) 98 F (36.7 C) (Oral)  Resp 12  Ht  (1.626 m)  Wt 198 lb (89.812 kg)  BMI 33.97 kg/m2  SpO2 97% Wt Readings from Last 3 Encounters:  07/20/15 198 lb (89.812 kg)  04/20/15 202 lb 4.8 oz (91.763 kg)  02/18/14 204 lb 6.4 oz (92.715 kg)   Constitutional: overweight, in NAD Eyes: PERRLA, EOMI, no exophthalmos ENT: moist mucous membranes, no thyromegaly, no cervical lymphadenopathy Cardiovascular: RRR, No MRG Respiratory: CTA B Gastrointestinal: abdomen soft, NT, ND, BS+ Musculoskeletal: no deformities, strength intact in all 4 Skin: moist, warm, no rashes, + acanthosis nigricans on neck Neurological: no tremor with outstretched hands, DTR normal in all 4  ASSESSMENT: 1. DM2, non-insulin-dependent, uncontrolled, with complications - [facility]  Cardiologist: Dr Rennis Golden  PLAN:  1. Patient with long-standing, uncontrolled diabetes, on oral antidiabetic regimen, which became insufficient. Her last hemoglobin A1c was 10%, 2 months ago. Her sugars in the morning are not  far from goal, so I expect that either her sugars are getting very high later in the day or her diabetes control has improved since then. It is difficult to tell, since she is not checking sugars later in the day. I strongly advised her to start doing so. - For now, I advised her to continue metformin at the current dose, move glipizide extended-release in a.m., increased Farxiga to target dose and add Januvia. I will reevaluate her in a month and a half, and at that time, we may be able to de-escalate the treatment by decreasing glipizide. She refuses insulin, and, I agreed that we should try other treatments first before going to insulin. - She is interested in a referral to nutrition (done), and I also gave her preference for the diabetic group upstairs that meets once a month - I suggested to:  Patient Instructions  Please continue Metformin 1000 mg 2x a day with meals. Increase Farxiga to 10 mg in am. Add Januvia 100 mg in am. Move Glipizide ER 10 mg to am.  Please let me know if the sugars are consistently <80 or >200.  Please schedule an appt with Oran Rein with nutrition.  Please return in 1.5 months with your sugar log.   - Strongly advised her to start checking sugars at different times of the day - check 1-2 times a day, rotating checks - given sugar log and advised how to fill it and to bring it at next appt  - given foot care handout and explained the principles  - given instructions for hypoglycemia management "15-15 rule"  - advised for yearly eye exams >> She needs one - Return to clinic in 1.5 mo with sugar log

## 2015-07-23 LAB — HM DIABETES EYE EXAM

## 2015-08-05 ENCOUNTER — Encounter: Payer: Self-pay | Admitting: Dietician

## 2015-08-05 ENCOUNTER — Encounter: Payer: 59 | Attending: Internal Medicine | Admitting: Dietician

## 2015-08-05 VITALS — Ht 64.0 in | Wt 200.0 lb

## 2015-08-05 DIAGNOSIS — I429 Cardiomyopathy, unspecified: Secondary | ICD-10-CM | POA: Diagnosis not present

## 2015-08-05 DIAGNOSIS — Z713 Dietary counseling and surveillance: Secondary | ICD-10-CM | POA: Insufficient documentation

## 2015-08-05 DIAGNOSIS — E1165 Type 2 diabetes mellitus with hyperglycemia: Secondary | ICD-10-CM | POA: Diagnosis present

## 2015-08-05 DIAGNOSIS — E118 Type 2 diabetes mellitus with unspecified complications: Secondary | ICD-10-CM

## 2015-08-05 NOTE — Patient Instructions (Signed)
Set an alarm to remember to check your blood sugar after dinner. (2 hours after your first bite.) Be mindful?  Think about your choice.  Am I hungry? Be cautious about juice. Aim for 3 Carb Choices per meal (45 grams) +/- 1 either way  Aim for 0-1 Carbs per snack if hungry  Include protein in moderation with your meals and snacks Consider reading food labels for Total Carbohydrate and Fat Grams of foods Consider  increasing your activity level by walking for 30 minutes daily as tolerated

## 2015-08-06 NOTE — Progress Notes (Signed)
Diabetes Self-Management Education  Visit Type: First/Initial  Appt. Start Time: 1500 Appt. End Time: 1630  08/06/2015  Laurie Small, identified by name and date of birth, is a 40 y.o. female with a diagnosis of Diabetes: Type 2. In 2003 along with Nonischemic Cardiomyopathy.  Other hx includes HTN, HLD and parathyroid surgery in 2001.  She has been forgetting to take her blood sugar in the afternoon.  Patient lives with her husband (daughter in college).  She does the shopping and cooking.  She works from home for [employer].  Her husband works second shift.  She weighed 250 lbs when admitted to the hospital with cardiomyopathy in 2003 and reduced to 225 lbs.   She said that the illness changed her life.  They stopped eating fast food and cooked much more at home.  They do eat out on weekends.  She uses no added salt and asks for chefs not to add salt when this is possible.  She decreased her bread intake about 1 year ago and only eats this when she has a sandwich.  She has not been walking due to getting out of the habit and decreased motivation.  She has a walking video but prefers to walk outside.  She also can go to the gym.   ASSESSMENT  Height 5\' 4"  (1.626 m), weight 200 lb (90.719 kg). Body mass index is 34.31 kg/(m^2).      Diabetes Self-Management Education - 08/05/15 1519    Visit Information   Visit Type First/Initial   Initial Visit   Diabetes Type Type 2   Are you currently following a meal plan? No   Are you taking your medications as prescribed? Yes   Date Diagnosed 2003   Health Coping   How would you rate your overall health? Good   Psychosocial Assessment   Patient Belief/Attitude about Diabetes Motivated to manage diabetes   Self-care barriers None   Self-management support Doctor's office;Family   Other persons present Patient   Patient Concerns Nutrition/Meal planning;Glycemic Control;Weight Control   Special Needs None   Preferred Learning Style No  preference indicated   Learning Readiness Ready   How often do you need to have someone help you when you read instructions, pamphlets, or other written materials from your doctor or pharmacy? 1 - Never   What is the last grade level you completed in school? 2 years college   Complications   Last HgB A1C per patient/outside source 10 %  05/2015   How often do you check your blood sugar? 1-2 times/day   Fasting Blood glucose range (mg/dL) 962-836   Number of hypoglycemic episodes per month 0   Have you had a dilated eye exam in the past 12 months? Yes   Have you had a dental exam in the past 12 months? Yes   Are you checking your feet? Yes   How many days per week are you checking your feet? 7   Dietary Intake   Breakfast fruit with yogurt OR sweet or unsweetened cereal with 2% milk and fruit OR eggs, grits, occasional hashbrowns with fruit OR french toast with french toast and fruit  6-7   Snack (morning) fruit   Lunch canned chef boy r dee ravioli, canned soup, or salad or baked potatoes or leftovers  11-12    Snack (afternoon) chips or fruit or M&M's   Dinner baked potato and salad OR baked meat, starch, and vegetables  5   Snack (evening) M&M's or sugar  free chocolates   Exercise   Exercise Type Light (walking / raking leaves)   How many days per week to you exercise? 0   How many minutes per day do you exercise? 0   Total minutes per week of exercise 0   Patient Education   Previous Diabetes Education Yes (please comment)  2003   Disease state  Definition of diabetes, type 1 and 2, and the diagnosis of diabetes   Nutrition management  Role of diet in the treatment of diabetes and the relationship between the three main macronutrients and blood glucose level;Food label reading, portion sizes and measuring food.;Information on hints to eating out and maintain blood glucose control.;Meal options for control of blood glucose level and chronic complications.   Physical activity and  exercise  Role of exercise on diabetes management, blood pressure control and cardiac health.;Helped patient identify appropriate exercises in relation to his/her diabetes, diabetes complications and other health issue.   Monitoring Identified appropriate SMBG and/or A1C goals.   Chronic complications Dental care   Psychosocial adjustment Worked with patient to identify barriers to care and solutions   Individualized Goals (developed by patient)   Nutrition Follow meal plan discussed;General guidelines for healthy choices and portions discussed   Physical Activity Exercise 3-5 times per week;30 minutes per day   Medications take my medication as prescribed   Monitoring  test my blood glucose as discussed   Outcomes   Expected Outcomes Demonstrated interest in learning. Expect positive outcomes   Future DMSE PRN      Individualized Plan for Diabetes Self-Management Training:   Learning Objective:  Patient will have a greater understanding of diabetes self-management. Patient education plan is to attend individual and/or group sessions per assessed needs and concerns.   Plan:   Patient Instructions  Set an alarm to remember to check your blood sugar after dinner. (2 hours after your first bite.) Be mindful?  Think about your choice.  Am I hungry? Be cautious about juice. Aim for 3 Carb Choices per meal (45 grams) +/- 1 either way  Aim for 0-1 Carbs per snack if hungry  Include protein in moderation with your meals and snacks Consider reading food labels for Total Carbohydrate and Fat Grams of foods Consider  increasing your activity level by walking for 30 minutes daily as tolerated    Expected Outcomes:  Demonstrated interest in learning. Expect positive outcomes  Education material provided: Food label handouts, Meal plan card and Snack sheet, eating out  If problems or questions, patient to contact team via:  Phone and Email  Future DSME appointment: PRN

## 2015-09-07 ENCOUNTER — Other Ambulatory Visit: Payer: Self-pay | Admitting: *Deleted

## 2015-09-07 MED ORDER — SITAGLIPTIN PHOSPHATE 100 MG PO TABS: 100.0000 mg | ORAL_TABLET | Freq: Every day | ORAL | Status: DC

## 2015-09-14 ENCOUNTER — Ambulatory Visit (INDEPENDENT_AMBULATORY_CARE_PROVIDER_SITE_OTHER): Payer: 59 | Admitting: Internal Medicine

## 2015-09-14 ENCOUNTER — Encounter: Payer: Self-pay | Admitting: Internal Medicine

## 2015-09-14 ENCOUNTER — Other Ambulatory Visit (INDEPENDENT_AMBULATORY_CARE_PROVIDER_SITE_OTHER): Payer: 59 | Admitting: *Deleted

## 2015-09-14 VITALS — BP 118/70 | HR 86 | Temp 98.1°F | Resp 12 | Wt 200.0 lb

## 2015-09-14 DIAGNOSIS — R809 Proteinuria, unspecified: Secondary | ICD-10-CM | POA: Diagnosis not present

## 2015-09-14 DIAGNOSIS — E1129 Type 2 diabetes mellitus with other diabetic kidney complication: Secondary | ICD-10-CM

## 2015-09-14 LAB — POCT GLYCOSYLATED HEMOGLOBIN (HGB A1C): HEMOGLOBIN A1C: 8.9

## 2015-09-14 MED ORDER — DULAGLUTIDE 1.5 MG/0.5ML ~~LOC~~ SOAJ: SUBCUTANEOUS | Status: DC

## 2015-09-14 NOTE — Progress Notes (Addendum)
Patient ID: [name], female   DOB: 1976/07/20, 40 y.o.   MRN: [id number]  HPI: Laurie Small is a 40 y.o.-year-old female, returning for f/u for DM2, dx in 2003 (after an episode of myocarditis), non-insulin-dependent, uncontrolled, with complications (microalbuminuria). Last visit 1.5 mo ago.  She saw nutrition since last visit.  Last hemoglobin A1c was: 05/31/2015: HbA1c 10% 09/15/2014: HbA1c 9.6% 04/23/2014: HbA1c 10.9% 01/20/2014: HbA1c 12.9%  Pt was on a regimen of: - Metformin 1000 mg 2x a day, with meals - Glipizide ER XL 10 mg before dinner - Farxiga 5 mg in am She was on Onglyza (too expensive).  She was on JanuMet (stopped being effective). She was on Victoza (trial period). She refused insulin.  At last visit we changed to: - Metformin 1000 mg 2x a day, with meals - Glipizide ER XL 10 mg in pm >> in am - Farxiga 5 >> 10 mg in am - Januvia 100 mg in am (added 06/2015).  Pt checks her sugars 1-2x a day and they are: - am: 104-160 >> 113-229, 242 - 2h after b'fast: n/c >> 198-292 - before lunch: n/c >> 178, 187 - 2h after lunch: n/c >> n/c - before dinner: n/c >> 136-205 - 2h after dinner: n/c >> 142, 224 - bedtime: n/c - nighttime: n/c No lows. Lowest sugar was 96 >> 104; ? hypoglycemia awareness.  Highest sugar was 242.  Glucometer: FreeStyle Ultra mini  Pt's meals are: - Breakfast: fruit, yoghurt, oatmeal, eggs, grits, bacon - Lunch: pasta, sandwich, salad - Dinner: meat + veggies - Snacks: 3  She works from home.  She walks for exercise.  - no CKD, last BUN/creatinine:  05/31/2015: 13/0.91 Lab Results  Component Value Date   BUN 16 01/06/2013   CREATININE 0.98 01/06/2013  ACR  (09/15/2014): 44.4 ACR (01/20/2014): 79.9 On Ramipril. - last set of lipids: 06/12/2014: 147/118/44/80 Lab Results  Component Value Date   CHOL 158 01/06/2013   HDL 67 01/06/2013   LDLCALC 66 01/06/2013   TRIG 123 01/06/2013   CHOLHDL 2.4 01/06/2013   On Lipitor. - last eye exam was in 07/23/2015. No DR.  - no numbness and tingling in her feet. Heel spurs.  She also has a history of hypertension, hyperlipidemia, history of pulmonary embolism. She has had parathyroid surgery in 2001.  ROS: Constitutional: no weight gain/loss, + fatigue, + hot flashes Eyes: no blurry vision, no xerophthalmia ENT: no sore throat, no nodules palpated in throat, no dysphagia/odynophagia, no hoarseness Cardiovascular: no CP/SOB/+ palpitations/+ leg swelling Respiratory: no cough/SOB Gastrointestinal: no N/V/D/C/+ heartburn Musculoskeletal: no muscle/joint aches Skin: no Rash Neurological: no tremors/numbness/tingling/dizziness  I reviewed pt's medications, allergies, PMH, social hx, family hx, and changes were documented in the history of present illness. Otherwise, unchanged from my initial visit note.  Past Medical History  Diagnosis Date  . NICM (nonischemic cardiomyopathy) (HCC)     2D ECHO, 12/26/2011 - EF 45-50%, left ventricle borderline dilated  . Depression    Past Surgical History  Procedure Laterality Date  . Cardiac catheterization  01/06/2002    Dilated nonsichemic cardiopmyopathy, EF 28%, at least 2+ angiographic mitral regurgitation   Social History   Social History  . Marital Status: Married    Spouse Name: N/A  . Number of Children: 1   Occupational History  .  claims benefits specialist    Social History Main Topics  . Smoking status: Never Smoker   . Smokeless tobacco: Not on file  . Alcohol Use: No  .  Drug Use: No   Current Outpatient Prescriptions on File Prior to Visit  Medication Sig Dispense Refill  . aspirin 325 MG tablet Take 325 mg by mouth daily.    Marland Kitchen atorvastatin (LIPITOR) 40 MG tablet Take 1 tablet (40 mg total) by mouth daily at 6 PM. 90 tablet 3  . carvedilol (COREG) 25 MG tablet Take 1 tablet (25 mg total) by mouth 2 (two) times daily with a meal. 180 tablet 3  . dapagliflozin propanediol (FARXIGA) 10 MG  TABS tablet Take 10 mg by mouth daily. 30 tablet 2  . digoxin (LANOXIN) 0.25 MG tablet Takes 1 tablets by mouth daily and alternate with 1/2 tablet every other day 90 tablet 3  . diphenhydrAMINE (BENADRYL) 25 MG tablet Take 25 mg by mouth daily.    Marland Kitchen glipiZIDE (GLUCOTROL XL) 10 MG 24 hr tablet Take 1 tablet by mouth daily.    . hydrochlorothiazide (HYDRODIURIL) 25 MG tablet Take 1 tablet (25 mg total) by mouth daily. 90 tablet 1  . hydrocortisone 2.5 % cream as needed.    . loratadine (CLARITIN) 10 MG tablet Take 10 mg by mouth daily.    . metFORMIN (GLUCOPHAGE) 1000 MG tablet Take 1 tablet by mouth 2 (two) times daily with a meal.   0  . ramipril (ALTACE) 5 MG capsule Take 2 tablets (10 mg total) by mouth in the morning and 1 tablet (5 mg) in the evening 270 capsule 3  . sitaGLIPtin (JANUVIA) 100 MG tablet Take 1 tablet (100 mg total) by mouth daily. 90 tablet 0  . spironolactone (ALDACTONE) 25 MG tablet TAKE 0.5 TABLETS (12.5 MG TOTAL) BY MOUTH DAILY. 45 tablet 3   No current facility-administered medications on file prior to visit.   Allergies  Allergen Reactions  . Betadine [Povidone Iodine]   . Iodine   . Peanuts [Peanut Oil]   . Shellfish Allergy   . Other Hives and Rash    Sunlight and Grass    Family History  Problem Relation Age of Onset  . Hypertension Mother   . GI Bleed Mother   . Pneumonia Maternal Grandmother   . Cancer Paternal Grandmother     Breast cancer   PE: BP 118/70 mmHg  Pulse 86  Temp(Src) 98.1 F (36.7 C) (Oral)  Resp 12  Wt 200 lb (90.719 kg)  SpO2 97% Body mass index is 34.31 kg/(m^2). Wt Readings from Last 3 Encounters:  09/14/15 200 lb (90.719 kg)  08/05/15 200 lb (90.719 kg)  07/20/15 198 lb (89.812 kg)   Constitutional: overweight, in NAD Eyes: PERRLA, EOMI, no exophthalmos ENT: moist mucous membranes, no thyromegaly, no cervical lymphadenopathy Cardiovascular: RRR, No MRG Respiratory: CTA B Gastrointestinal: abdomen soft, NT, ND,  BS+ Musculoskeletal: no deformities, strength intact in all 4 Skin: moist, warm, no rashes, + acanthosis nigricans on neck Neurological: no tremor with outstretched hands, DTR normal in all 4  ASSESSMENT: 1. DM2, non-insulin-dependent, uncontrolled, with complications - [facility]  Cardiologist: Dr Rennis Golden  PLAN:  1. Patient with long-standing, uncontrolled diabetes (last HbA1c was 10%). At last visit, we continued metformin at the same dose, moved glipizide extended-release in a.m., increased Farxiga to target dose and added Januvia. She refused insulin, and, I agreed that we should try other treatments first before going to insulin. At this visit, her sugars are still high >> will add Trulicity and stop Januvia - I suggested to:  Patient Instructions  Please continue: - Metformin 1000 mg 2x a day, with meals -  Glipizide ER 10 mg in am  - Farxiga 10 mg in am - Januvia 100 mg in am   Add Trulicity 1.5 mg weekly.  Stop Januvia 4 days after you take the first injection.  Please return in 1.5 months with your sugar log.   - continue checking sugars at different times of the day - check 1-2 times a day, rotating checks - advised for yearly eye exams >> She is UTD - HbA1c today >> 8.9% (better) - we will check her insulin production today: Orders Placed This Encounter  Procedures  . C-peptide  . Glucose, Fasting  . Glutamic acid decarboxylase auto abs  . Anti-islet cell antibody  . HM DIABETES EYE EXAM  - Return to clinic in 1.5 mo with sugar log   Orders Only on 09/14/2015  Component Date Value Ref Range Status  . Hemoglobin A1C 09/14/2015 8.9   Final  Office Visit on 09/14/2015  Component Date Value Ref Range Status  . HM Diabetic Eye Exam 07/23/2015 No Retinopathy  No Retinopathy Final  . C-Peptide 09/14/2015 5.57* 0.80-3.85 ng/mL Final   Comment: ** Please note change in reference range(s). **     . Glucose, Fasting 09/14/2015 147* 65 - 99 mg/dL Final   Comment:     < 100   mg/dL = normal fasting glucose 100-125  mg/dL = IFG (impaired fasting glucose)   > 125  mg/dL = provisional diagnosis of diabetes     . Glutamic Acid Decarb Ab 09/14/2015 <5  <5 IU/mL Final   Comment: This test was performed using the GAD65 ELISA method. New method, ELISA, is standardized against the International reference preparation 97/550, is reported in International Units/mL (IU/mL) and a new reference range was implemented.   . Pancreatic Islet Cell Antibody 09/14/2015 <5  < 5 JDF Units Final   Comment:    Laboratory Developed Test performed using a reagent labeled by  the manufacturer as ASR Class I or ASR Class II (non-blood bank).    This test(s) was developed and its performance characteristics  have been determined by [employer]. It has not been cleared or approved by the [place] Food and  Drug Administration. The FDA has determined that such clearance  or approval is not necessary. Performance characteristics refer  to the analytical performance of the test.    Labs confirm type 2 rather than type 1 diabetes.

## 2015-09-14 NOTE — Patient Instructions (Addendum)
Please continue: - Metformin 1000 mg 2x a day, with meals - Glipizide ER 10 mg in am  - Farxiga 10 mg in am - Januvia 100 mg in am   Add Trulicity 1.5 mg weekly.  Stop Januvia 4 days after you take the first injection.  Please return in 1.5 months with your sugar log.

## 2015-09-16 ENCOUNTER — Telehealth: Payer: Self-pay | Admitting: Internal Medicine

## 2015-09-16 LAB — C-PEPTIDE: C PEPTIDE: 5.57 ng/mL — AB (ref 0.80–3.85)

## 2015-09-16 LAB — GLUCOSE, FASTING: GLUCOSE, FASTING: 147 mg/dL — AB (ref 65–99)

## 2015-09-16 MED ORDER — INSULIN PEN NEEDLE 32G X 4 MM MISC: Status: DC

## 2015-09-16 NOTE — Telephone Encounter (Signed)
Pen needles sent to pt's pharmacy.

## 2015-09-16 NOTE — Telephone Encounter (Signed)
Patient called stating that she needs an Rx sent to her pharmacy   Rx: Pen needles    Thank you

## 2015-09-19 LAB — GLUTAMIC ACID DECARBOXYLASE AUTO ABS

## 2015-09-20 LAB — ANTI-ISLET CELL ANTIBODY: Pancreatic Islet Cell Antibody: <5 JDF Units (ref <5)

## 2015-09-21 ENCOUNTER — Other Ambulatory Visit: Payer: Self-pay | Admitting: *Deleted

## 2015-09-21 MED ORDER — DAPAGLIFLOZIN PROPANEDIOL 10 MG PO TABS: 10.0000 mg | ORAL_TABLET | Freq: Every day | ORAL | Status: DC

## 2015-10-28 ENCOUNTER — Ambulatory Visit: Payer: 59 | Admitting: Internal Medicine

## 2015-10-29 ENCOUNTER — Ambulatory Visit (INDEPENDENT_AMBULATORY_CARE_PROVIDER_SITE_OTHER): Payer: 59 | Admitting: Internal Medicine

## 2015-10-29 ENCOUNTER — Encounter: Payer: Self-pay | Admitting: Internal Medicine

## 2015-10-29 VITALS — BP 114/60 | HR 91 | Temp 98.4°F | Resp 12 | Wt 196.0 lb

## 2015-10-29 DIAGNOSIS — R809 Proteinuria, unspecified: Secondary | ICD-10-CM

## 2015-10-29 DIAGNOSIS — E1129 Type 2 diabetes mellitus with other diabetic kidney complication: Secondary | ICD-10-CM

## 2015-10-29 NOTE — Patient Instructions (Signed)
Please continue: - Metformin 1000 mg 2x a day, with meals - Glipizide ER 10 mg in am  - Farxiga 10 mg in am - Trulicity 1.5 mg weekly  Keep up the good work!  Please return in 1.5 months with your sugar log.

## 2015-10-29 NOTE — Progress Notes (Signed)
Patient ID: Laurie Small, female   DOB: [birth year], 40 y.o.   MRN: [id number]  HPI: Laurie Small is a 40 y.o.-year-old female, returning for f/u for DM2, dx in 2003 (after an episode of myocarditis), non-insulin-dependent, uncontrolled, with complications (microalbuminuria). Last visit 1.5 mo ago.  Last hemoglobin A1c was: Lab Results  Component Value Date   HGBA1C 8.9 09/14/2015  05/31/2015: HbA1c 10% 09/15/2014: HbA1c 9.6% 04/23/2014: HbA1c 10.9% 01/20/2014: HbA1c 12.9%  Pt was on a regimen of: - Metformin 1000 mg 2x a day, with meals - Glipizide ER XL 10 mg before dinner - Farxiga 5 mg in am She was on Onglyza (too expensive).  She was on JanuMet (stopped being effective), then Januvia >> stopped when starting Trulicity. She was on Victoza (trial period). She refused insulin.  At last visit we changed to: - Metformin 1000 mg 2x a day, with meals - Glipizide ER XL 10 mg in pm >> in am - Farxiga 5 >> 10 mg in am - Trulicity 1.5 mg weekly.  Pt checks her sugars 1-2x a day and they are: - am: 104-160 >> 113-229, 242 >> 124-164, 217 - 2h after b'fast: n/c >> 198-292 >> n/c - before lunch: n/c >> 178, 187 >> 176 - 2h after lunch: n/c >> n/c >> 136 - before dinner: n/c >> 136-205 >> n/c - 2h after dinner: n/c >> 142, 224 >> n/c - bedtime: n/c - nighttime: n/c No lows. Lowest sugar was 96 >> 104 >> 124; ? hypoglycemia awareness.  Highest sugar was 242 >> 217.  Glucometer: FreeStyle Ultra mini  Pt's meals are: - Breakfast: fruit, yoghurt, oatmeal, eggs, grits, bacon - Lunch: pasta, sandwich, salad - Dinner: meat + veggies - Snacks: 3  She works from home.  She walks for exercise.  - no CKD, last BUN/creatinine:  05/31/2015: 13/0.91 Lab Results  Component Value Date   BUN 16 01/06/2013   CREATININE 0.98 01/06/2013  ACR  (09/15/2014): 44.4 ACR (01/20/2014): 79.9 On Ramipril. - last set of lipids (monitored by cardiology) 06/12/2014: 147/118/44/80 Lab  Results  Component Value Date   CHOL 158 01/06/2013   HDL 67 01/06/2013   LDLCALC 66 01/06/2013   TRIG 123 01/06/2013   CHOLHDL 2.4 01/06/2013  On Lipitor. - last eye exam was in 07/23/2015. No DR.  - no numbness and tingling in her feet. Heel spurs.  She also has a history of hypertension, hyperlipidemia, history of pulmonary embolism. She has had parathyroid surgery in 2001.  ROS: Constitutional: no weight gain/loss, no fatigue Eyes: no blurry vision, no xerophthalmia ENT: no sore throat, no nodules palpated in throat, no dysphagia/odynophagia, no hoarseness Cardiovascular: no CP/SOB/palpitations/leg swelling Respiratory: no cough/SOB Gastrointestinal: no N/V/D/C/heartburn Musculoskeletal: no muscle/joint aches Skin: no Rash Neurological: no tremors/numbness/tingling/dizziness  I reviewed pt's medications, allergies, PMH, social hx, family hx, and changes were documented in the history of present illness. Otherwise, unchanged from my initial visit note.  Past Medical History  Diagnosis Date  . NICM (nonischemic cardiomyopathy) (HCC)     2D ECHO, 12/26/2011 - EF 45-50%, left ventricle borderline dilated  . Depression    Past Surgical History  Procedure Laterality Date  . Cardiac catheterization  01/06/2002    Dilated nonsichemic cardiopmyopathy, EF 28%, at least 2+ angiographic mitral regurgitation   Social History   Social History  . Marital Status: Married    Spouse Name: N/A  . Number of Children: 1   Occupational History  .  claims benefits specialist  Social History Main Topics  . Smoking status: Never Smoker   . Smokeless tobacco: Not on file  . Alcohol Use: No  . Drug Use: No   Current Outpatient Prescriptions on File Prior to Visit  Medication Sig Dispense Refill  . aspirin 325 MG tablet Take 325 mg by mouth daily.    Marland Kitchen atorvastatin (LIPITOR) 40 MG tablet Take 1 tablet (40 mg total) by mouth daily at 6 PM. 90 tablet 3  . carvedilol (COREG) 25 MG tablet  Take 1 tablet (25 mg total) by mouth 2 (two) times daily with a meal. 180 tablet 3  . dapagliflozin propanediol (FARXIGA) 10 MG TABS tablet Take 10 mg by mouth daily. 90 tablet 0  . digoxin (LANOXIN) 0.25 MG tablet Takes 1 tablets by mouth daily and alternate with 1/2 tablet every other day 90 tablet 3  . diphenhydrAMINE (BENADRYL) 25 MG tablet Take 25 mg by mouth daily.    . Dulaglutide (TRULICITY) 1.5 MG/0.5ML SOPN Inject 1.5 mg once a week 4 pen 1  . glipiZIDE (GLUCOTROL XL) 10 MG 24 hr tablet Take 1 tablet by mouth daily.    . hydrochlorothiazide (HYDRODIURIL) 25 MG tablet Take 1 tablet (25 mg total) by mouth daily. 90 tablet 1  . hydrocortisone 2.5 % cream as needed.    . Insulin Pen Needle 32G X 4 MM MISC Use as directed. 30 each 2  . loratadine (CLARITIN) 10 MG tablet Take 10 mg by mouth daily.    . metFORMIN (GLUCOPHAGE) 1000 MG tablet Take 1 tablet by mouth 2 (two) times daily with a meal.   0  . ramipril (ALTACE) 5 MG capsule Take 2 tablets (10 mg total) by mouth in the morning and 1 tablet (5 mg) in the evening 270 capsule 3  . sitaGLIPtin (JANUVIA) 100 MG tablet Take 1 tablet (100 mg total) by mouth daily. 90 tablet 0  . spironolactone (ALDACTONE) 25 MG tablet TAKE 0.5 TABLETS (12.5 MG TOTAL) BY MOUTH DAILY. 45 tablet 3   No current facility-administered medications on file prior to visit.   Allergies  Allergen Reactions  . Betadine [Povidone Iodine]   . Iodine   . Peanuts [Peanut Oil]   . Shellfish Allergy   . Other Hives and Rash    Sunlight and Grass    Family History  Problem Relation Age of Onset  . Hypertension Mother   . GI Bleed Mother   . Pneumonia Maternal Grandmother   . Cancer Paternal Grandmother     Breast cancer   PE: BP 114/60 mmHg  Pulse 91  Temp(Src) 98.4 F (36.9 C) (Oral)  Resp 12  Wt 196 lb (88.905 kg)  SpO2 99% Body mass index is 33.63 kg/(m^2). Wt Readings from Last 3 Encounters:  10/29/15 196 lb (88.905 kg)  09/14/15 200 lb (90.719 kg)   08/05/15 200 lb (90.719 kg)   Constitutional: overweight, in NAD Eyes: PERRLA, EOMI, no exophthalmos ENT: moist mucous membranes, no thyromegaly, no cervical lymphadenopathy Cardiovascular: RRR, No MRG Respiratory: CTA B Gastrointestinal: abdomen soft, NT, ND, BS+ Musculoskeletal: no deformities, strength intact in all 4 Skin: moist, warm, no rashes, + acanthosis nigricans on neck Neurological: no tremor with outstretched hands, DTR normal in all 4  ASSESSMENT: 1. DM2, non-insulin-dependent, uncontrolled, with complications - [facility]  Cardiologist: Dr Rennis Golden  PLAN:  1. Patient with long-standing, uncontrolled diabetes (HbA1c was 10% >> 8.9%) >> now better after starting Trulicity. Sugars are still above goal, but improved. Will continue current regimen  and also discussed about improving diet. I advised her to check sugars later in the day, too, as now she is only checking in am. - I suggested to:  Patient Instructions  Please continue: - Metformin 1000 mg 2x a day, with meals - Glipizide ER 10 mg in am  - Farxiga 10 mg in am - Trulicity 1.5 mg weekly.  Please return in 1.5 months with your sugar log.   - continue checking sugars at different times of the day - check 1-2 times a day, rotating checks - advised for yearly eye exams >> She is UTD - Return to clinic in 1.5 mo with sugar log

## 2015-11-04 ENCOUNTER — Other Ambulatory Visit: Payer: Self-pay | Admitting: *Deleted

## 2015-11-04 MED ORDER — DULAGLUTIDE 1.5 MG/0.5ML ~~LOC~~ SOAJ: SUBCUTANEOUS | Status: DC

## 2015-11-12 ENCOUNTER — Telehealth: Payer: Self-pay | Admitting: Internal Medicine

## 2015-11-12 NOTE — Telephone Encounter (Signed)
Please ask her to call her insurance and ask if Tanzeum or Bydureon (both once a week) or Victoza (once a day) would be covered. Let me know if not.

## 2015-11-12 NOTE — Telephone Encounter (Signed)
Trulicity is too expensive, with the discount card it is $380. PT would like to know if there are any other alternatives.

## 2015-11-12 NOTE — Telephone Encounter (Signed)
Please read message below and advise.  

## 2015-11-15 NOTE — Telephone Encounter (Signed)
Called pt and lvm advising her per Dr Charlean Sanfilippo message below. Advised pt to call back with what the ins advises.

## 2015-11-19 ENCOUNTER — Telehealth: Payer: Self-pay | Admitting: Internal Medicine

## 2015-11-19 MED ORDER — ALBIGLUTIDE 50 MG ~~LOC~~ PEN
50.0000 mg | PEN_INJECTOR | SUBCUTANEOUS | Status: DC
Start: 2015-11-19 — End: 2016-01-07

## 2015-11-19 NOTE — Telephone Encounter (Signed)
Great! Let's start Tanzeum 50 mg weekly.

## 2015-11-19 NOTE — Telephone Encounter (Signed)
Pt has not taken the trulicity this week or next because it is now $400. Please advise  No one has given her the alternate

## 2015-11-19 NOTE — Telephone Encounter (Signed)
Called pt and advised her that we left a vm last week. Advised her per Dr Charlean Sanfilippo message to have her call her ins co to see which med they will cover; tanzeum, bydureon or victoza.. Pt voiced understanding.

## 2015-11-19 NOTE — Telephone Encounter (Signed)
Please read message below. Tanzeum and Bydureon are covered. Please advise.

## 2015-11-19 NOTE — Telephone Encounter (Signed)
tanzeum bydureon These two are covered by insurance for 30 day supply please do not do 90 day supply that would be too high, call into [facility]

## 2015-12-21 ENCOUNTER — Other Ambulatory Visit (INDEPENDENT_AMBULATORY_CARE_PROVIDER_SITE_OTHER): Payer: Managed Care, Other (non HMO) | Admitting: *Deleted

## 2015-12-21 ENCOUNTER — Encounter: Payer: Self-pay | Admitting: Internal Medicine

## 2015-12-21 ENCOUNTER — Ambulatory Visit (INDEPENDENT_AMBULATORY_CARE_PROVIDER_SITE_OTHER): Payer: Managed Care, Other (non HMO) | Admitting: Internal Medicine

## 2015-12-21 VITALS — BP 104/60 | HR 97 | Temp 98.0°F | Resp 12 | Wt 196.2 lb

## 2015-12-21 DIAGNOSIS — E1129 Type 2 diabetes mellitus with other diabetic kidney complication: Secondary | ICD-10-CM

## 2015-12-21 DIAGNOSIS — R809 Proteinuria, unspecified: Secondary | ICD-10-CM

## 2015-12-21 LAB — POCT GLYCOSYLATED HEMOGLOBIN (HGB A1C): Hemoglobin A1C: 8.8

## 2015-12-21 NOTE — Progress Notes (Signed)
Patient ID: Laurie Small, female   DOB: 19-Jun-1976, 40 y.o.   MRN: [id number]  HPI: Laurie Small is a 40 y.o.-year-old female, returning for f/u for DM2, dx in 2003 (after an episode of myocarditis), non-insulin-dependent, uncontrolled, with complications (microalbuminuria). Last visit 1.5 mo ago.  Last hemoglobin A1c was: Lab Results  Component Value Date   HGBA1C 8.9 09/14/2015  05/31/2015: HbA1c 10% 09/15/2014: HbA1c 9.6% 04/23/2014: HbA1c 10.9% 01/20/2014: HbA1c 12.9%  Pt was on a regimen of: - Metformin 1000 mg 2x a day, with meals - Glipizide ER XL 10 mg before dinner - Farxiga 5 mg in am She was on Onglyza (too expensive).  She was on JanuMet (stopped being effective), then Januvia >> stopped when starting Trulicity She was on Victoza (trial period). She refused insulin.  At last visit we changed to: - Metformin 1000 mg 2x a day, with meals - Glipizide ER XL 10 mg in pm >> in am - Farxiga 5 >> 10 mg in am - Trulicity 1.5 mg weekly >> Tanzeum 50 mg weekly (at night) - changed 10/2015 2/2 insurance coverage - she does not like the Tanzeum as much  Pt checks her sugars 1x a day and they are: - am: 104-160 >> 113-229, 242 >> 124-164, 217 >> 135-211 (200s is she ate sweets the night before) - 2h after b'fast: n/c >> 198-292 >> n/c - before lunch: n/c >> 178, 187 >> 176 >> n/c - 2h after lunch: n/c >> n/c >> 136 >> n/c - before dinner: n/c >> 136-205 >> n/c - 2h after dinner: n/c >> 142, 224 >> n/c - bedtime: n/c - nighttime: n/c No lows. Lowest sugar was 96 >> 104 >> 124 >> 135; ? hypoglycemia awareness.  Highest sugar was 242 >> 217 >> 211.  Glucometer: FreeStyle Ultra mini  Pt's meals are: - Breakfast: fruit, yoghurt, oatmeal, eggs, grits, bacon - Lunch: pasta, sandwich, salad - Dinner: meat + veggies - Snacks: 3  She works from home.  She walks for exercise.  - no CKD, last BUN/creatinine:  05/31/2015: 13/0.91 Lab Results  Component Value Date   BUN 16 01/06/2013   CREATININE 0.98 01/06/2013  ACR  (09/15/2014): 44.4 ACR (01/20/2014): 79.9 On Ramipril. - last set of lipids (monitored by cardiology) 06/12/2014: 147/118/44/80 Lab Results  Component Value Date   CHOL 158 01/06/2013   HDL 67 01/06/2013   LDLCALC 66 01/06/2013   TRIG 123 01/06/2013   CHOLHDL 2.4 01/06/2013  On Lipitor. - last eye exam was in 07/23/2015. No DR.  - no numbness and tingling in her feet. Heel spurs.  She also has a history of hypertension, hyperlipidemia, history of pulmonary embolism. She has had parathyroid surgery in 2001.  ROS: Constitutional: no weight gain/loss, no fatigue Eyes: no blurry vision, no xerophthalmia ENT: no sore throat, no nodules palpated in throat, no dysphagia/odynophagia, no hoarseness Cardiovascular: no CP/SOB/palpitations/leg swelling Respiratory: no cough/SOB Gastrointestinal: no N/V/D/C/heartburn Musculoskeletal: no muscle/joint aches Skin: no Rash Neurological: no tremors/numbness/tingling/dizziness  I reviewed pt's medications, allergies, PMH, social hx, family hx, and changes were documented in the history of present illness. Otherwise, unchanged from my initial visit note.  Past Medical History  Diagnosis Date  . NICM (nonischemic cardiomyopathy) (HCC)     2D ECHO, 12/26/2011 - EF 45-50%, left ventricle borderline dilated  . Depression    Past Surgical History  Procedure Laterality Date  . Cardiac catheterization  01/06/2002    Dilated nonsichemic cardiopmyopathy, EF 28%, at least 2+ angiographic mitral  regurgitation   Social History   Social History  . Marital Status: Married    Spouse Name: N/A  . Number of Children: 1   Occupational History  .  claims benefits specialist    Social History Main Topics  . Smoking status: Never Smoker   . Smokeless tobacco: Not on file  . Alcohol Use: No  . Drug Use: No   Current Outpatient Prescriptions on File Prior to Visit  Medication Sig Dispense Refill  .  Albiglutide 50 MG PEN Inject 50 mg into the skin once a week. 4 each 2  . aspirin 325 MG tablet Take 325 mg by mouth daily.    Marland Kitchen atorvastatin (LIPITOR) 40 MG tablet Take 1 tablet (40 mg total) by mouth daily at 6 PM. 90 tablet 3  . carvedilol (COREG) 25 MG tablet Take 1 tablet (25 mg total) by mouth 2 (two) times daily with a meal. 180 tablet 3  . dapagliflozin propanediol (FARXIGA) 10 MG TABS tablet Take 10 mg by mouth daily. 90 tablet 0  . digoxin (LANOXIN) 0.25 MG tablet Takes 1 tablets by mouth daily and alternate with 1/2 tablet every other day 90 tablet 3  . diphenhydrAMINE (BENADRYL) 25 MG tablet Take 25 mg by mouth daily.    Marland Kitchen glipiZIDE (GLUCOTROL XL) 10 MG 24 hr tablet Take 1 tablet by mouth daily.    . hydrochlorothiazide (HYDRODIURIL) 25 MG tablet Take 1 tablet (25 mg total) by mouth daily. 90 tablet 1  . hydrocortisone 2.5 % cream as needed.    . Insulin Pen Needle 32G X 4 MM MISC Use as directed. 30 each 2  . loratadine (CLARITIN) 10 MG tablet Take 10 mg by mouth daily.    . metFORMIN (GLUCOPHAGE) 1000 MG tablet Take 1 tablet by mouth 2 (two) times daily with a meal.   0  . ramipril (ALTACE) 5 MG capsule Take 2 tablets (10 mg total) by mouth in the morning and 1 tablet (5 mg) in the evening 270 capsule 3  . sitaGLIPtin (JANUVIA) 100 MG tablet Take 1 tablet (100 mg total) by mouth daily. 90 tablet 0  . spironolactone (ALDACTONE) 25 MG tablet TAKE 0.5 TABLETS (12.5 MG TOTAL) BY MOUTH DAILY. 45 tablet 3   No current facility-administered medications on file prior to visit.   Allergies  Allergen Reactions  . Betadine [Povidone Iodine]   . Iodine   . Peanuts [Peanut Oil]   . Shellfish Allergy   . Other Hives and Rash    Sunlight and Grass    Family History  Problem Relation Age of Onset  . Hypertension Mother   . GI Bleed Mother   . Pneumonia Maternal Grandmother   . Cancer Paternal Grandmother     Breast cancer   PE: BP 104/60 mmHg  Pulse 97  Temp(Src) 98 F (36.7 C)  (Oral)  Resp 12  Wt 196 lb 3.2 oz (88.996 kg)  SpO2 96% Body mass index is 33.66 kg/(m^2). Wt Readings from Last 3 Encounters:  12/21/15 196 lb 3.2 oz (88.996 kg)  10/29/15 196 lb (88.905 kg)  09/14/15 200 lb (90.719 kg)   Constitutional: overweight, in NAD Eyes: PERRLA, EOMI, no exophthalmos ENT: moist mucous membranes, no thyromegaly, no cervical lymphadenopathy Cardiovascular: RRR, No MRG Respiratory: CTA B Gastrointestinal: abdomen soft, NT, ND, BS+ Musculoskeletal: no deformities, strength intact in all 4 Skin: moist, warm, no rashes, + acanthosis nigricans on neck Neurological: no tremor with outstretched hands, DTR normal in all 4  ASSESSMENT: 1. DM2, non-insulin-dependent, uncontrolled, with complications - MAU  Cardiologist: Dr Rennis Golden  PLAN:  1. Patient with long-standing, uncontrolled diabetes, with improved control after starting the GLP1 R agonist. Sugars are still above goal, and she is checking only in am. I again advised her to check later in the day, also, esp. At bedtime. She admits for snacking after dinner >> sugars highest in am then. We may need a Glipizide IR before b'fast >> I advised her to start checking sugars at bedtime >> send them to me in 1 week >> to decide if we need to add Glipizide before dinner. - I suggested to:  Patient Instructions  Please continue: - Metformin 1000 mg 2x a day, with meals - Glipizide ER XL 10 mg in pm  - Farxiga 10 mg in am  Move Tanzeum 50 mg weekly- to am, before b.fast.  Send me the sugars in 1 week (check at bedtime, too).  Please return in 3 months with your sugar log.   - continue checking sugars at different times of the day - check 1-2 times a day, rotating checks - advised for yearly eye exams >> She is UTD - Needs a lipid panel at next visit - today HbA1c 8.8% (same, still high) - Return to clinic in 3 mo with sugar log

## 2015-12-21 NOTE — Patient Instructions (Signed)
Please continue: - Metformin 1000 mg 2x a day, with meals - Glipizide ER XL 10 mg in pm  - Farxiga 10 mg in am  Move Tanzeum 50 mg weekly- to am, before b.fast.  Send me the sugars in 1 week (check at bedtime, too).  Please return in 3 months with your sugar log.

## 2015-12-22 ENCOUNTER — Other Ambulatory Visit: Payer: Self-pay | Admitting: Internal Medicine

## 2016-01-01 ENCOUNTER — Other Ambulatory Visit: Payer: Self-pay | Admitting: Internal Medicine

## 2016-01-03 NOTE — Telephone Encounter (Signed)
Rx request sent to pharmacy.  

## 2016-01-07 ENCOUNTER — Other Ambulatory Visit: Payer: Self-pay | Admitting: *Deleted

## 2016-01-07 MED ORDER — ALBIGLUTIDE 50 MG ~~LOC~~ PEN: 50.0000 mg | PEN_INJECTOR | SUBCUTANEOUS | Status: DC

## 2016-01-07 NOTE — Telephone Encounter (Signed)
Received fax requesting Rx to be changed to a 90 day supply, done 

## 2016-03-13 ENCOUNTER — Ambulatory Visit (INDEPENDENT_AMBULATORY_CARE_PROVIDER_SITE_OTHER): Payer: Managed Care, Other (non HMO) | Admitting: Internal Medicine

## 2016-03-13 ENCOUNTER — Encounter: Payer: Self-pay | Admitting: Internal Medicine

## 2016-03-13 VITALS — BP 130/82 | HR 86 | Ht 63.0 in | Wt 196.0 lb

## 2016-03-13 DIAGNOSIS — E1129 Type 2 diabetes mellitus with other diabetic kidney complication: Secondary | ICD-10-CM | POA: Diagnosis not present

## 2016-03-13 DIAGNOSIS — R809 Proteinuria, unspecified: Secondary | ICD-10-CM

## 2016-03-13 LAB — POCT GLYCOSYLATED HEMOGLOBIN (HGB A1C): Hemoglobin A1C: 9.2

## 2016-03-13 NOTE — Addendum Note (Signed)
Addended by: Darene Lamer T on: 03/13/2016 11:07 AM   Modules accepted: Orders

## 2016-03-13 NOTE — Patient Instructions (Addendum)
Please continue: - Metformin 1000 mg 2x a day, with meals - Farxiga 10 mg in am - Tanzeum 50 mg weekly  Please add: - Glipizide 5 mg before b'fast - crush the dinnertime Glipizide XL 10 mg (or take 2 half tablets)  Please return in 3 months with your sugar log.

## 2016-03-13 NOTE — Progress Notes (Signed)
Patient ID: Laurie Small, female   DOB: Mar 28, 1976, 40 y.o.   MRN: [id number]  HPI: Laurie Small is a 40 y.o.-year-old female, returning for f/u for DM2, dx in 2003 (after an episode of myocarditis), non-insulin-dependent, uncontrolled, with complications (microalbuminuria). Last visit 3 mo ago.  Last hemoglobin A1c was: Lab Results  Component Value Date   HGBA1C 8.8 12/21/2015   HGBA1C 8.9 09/14/2015  05/31/2015: HbA1c 10% 09/15/2014: HbA1c 9.6% 04/23/2014: HbA1c 10.9% 01/20/2014: HbA1c 12.9%  Pt was on a regimen of: - Metformin 1000 mg 2x a day, with meals - Glipizide ER XL 10 mg before dinner - Farxiga 5 mg in am She was on Onglyza (too expensive).  She was on JanuMet (stopped being effective), then Januvia >> stopped when starting Trulicity She was on Victoza (trial period). She refused insulin.  Now on: - Metformin 1000 mg 2x a day, with meals - Glipizide ER XL 10 mg in am - Farxiga 10 mg in am - Trulicity 1.5 mg weekly >> Tanzeum 50 mg weekly - changed 10/2015 2/2 insurance coverage - she does not like the Tanzeum as much  Pt checks her sugars 1x a day and they are: - am: 104-160 >> 113-229, 242 >> 124-164, 217 >> 135-211 (200s is she ate sweets the night before) >> 125-166, 180, 276 - 2h after b'fast: n/c >> 198-292 >> n/c - before lunch: n/c >> 178, 187 >> 176 >> n/c - 2h after lunch: n/c >> n/c >> 136 >> n/c - before dinner: n/c >> 136-205 >> n/c - 2h after dinner: n/c >> 142, 224 >> n/c >> 146, 180-225, 334 (sweet tea) - bedtime: n/c - nighttime: n/c No lows. Lowest sugar was 96 >> 104 >> 124 >> 135; ? hypoglycemia awareness.  Highest sugar was 242 >> 217 >> 211 >> 334.  Glucometer: FreeStyle Ultra mini  Pt's meals are: - Breakfast: fruit, yoghurt, oatmeal, eggs, grits, bacon - Lunch: pasta, sandwich, salad - Dinner: meat + veggies - Snacks: 3 40 She works from home. She walks for exercise - laps  - no CKD, last BUN/creatinine:  05/31/2015:  13/0.91 Lab Results  Component Value Date   BUN 16 01/06/2013   CREATININE 0.98 01/06/2013  ACR  (09/15/2014): 44.4 ACR (01/20/2014): 79.9 On Ramipril. - last set of lipids (monitored by cardiology) 06/12/2014: 147/118/44/80 Lab Results  Component Value Date   CHOL 158 01/06/2013   HDL 67 01/06/2013   LDLCALC 66 01/06/2013   TRIG 123 01/06/2013   CHOLHDL 2.4 01/06/2013  On Lipitor. - last eye exam was in 07/23/2015. No DR.  - no numbness and tingling in her feet. Heel spurs.  She also has a history of hypertension, hyperlipidemia, history of pulmonary embolism. She has had parathyroid surgery in 2001.  ROS: Constitutional: no weight gain/loss, no fatigue Eyes: no blurry vision, no xerophthalmia ENT: no sore throat, no nodules palpated in throat, no dysphagia/odynophagia, no hoarseness Cardiovascular: no CP/SOB/palpitations/leg swelling Respiratory: no cough/SOB Gastrointestinal: no N/V/D/C/heartburn Musculoskeletal: no muscle/joint aches Skin: no Rash Neurological: no tremors/numbness/tingling/dizziness  I reviewed pt's medications, allergies, PMH, social hx, family hx, and changes were documented in the history of present illness. Otherwise, unchanged from my initial visit note.  Past Medical History:  Diagnosis Date  . Depression   . NICM (nonischemic cardiomyopathy) (HCC)    2D ECHO, 12/26/2011 - EF 45-50%, left ventricle borderline dilated   Past Surgical History:  Procedure Laterality Date  . CARDIAC CATHETERIZATION  01/06/2002   Dilated nonsichemic cardiopmyopathy, EF 28%,  at least 2+ angiographic mitral regurgitation   Social History   Social History  . Marital Status: Married    Spouse Name: N/A  . Number of Children: 1   Occupational History  .  claims benefits specialist    Social History Main Topics  . Smoking status: Never Smoker   . Smokeless tobacco: Not on file  . Alcohol Use: No  . Drug Use: No   Current Outpatient Prescriptions on File  Prior to Visit  Medication Sig Dispense Refill  . Albiglutide 50 MG PEN Inject 50 mg into the skin once a week. 12 each 0  . aspirin 325 MG tablet Take 325 mg by mouth daily.    Marland Kitchen atorvastatin (LIPITOR) 40 MG tablet Take 1 tablet (40 mg total) by mouth daily at 6 PM. 90 tablet 3  . carvedilol (COREG) 25 MG tablet Take 1 tablet (25 mg total) by mouth 2 (two) times daily with a meal. 180 tablet 3  . digoxin (LANOXIN) 0.25 MG tablet Takes 1 tablets by mouth daily and alternate with 1/2 tablet every other day 90 tablet 3  . diphenhydrAMINE (BENADRYL) 25 MG tablet Take 25 mg by mouth daily.    Marland Kitchen FARXIGA 10 MG TABS tablet TAKE 1 TABLET BY MOUTH DAILY 90 tablet 0  . glipiZIDE (GLUCOTROL XL) 10 MG 24 hr tablet Take 1 tablet by mouth daily.    . hydrochlorothiazide (HYDRODIURIL) 25 MG tablet TAKE 1 TABLET (25 MG TOTAL) BY MOUTH DAILY. 90 tablet 1  . hydrocortisone 2.5 % cream as needed.    . Insulin Pen Needle 32G X 4 MM MISC Use as directed. 30 each 2  . loratadine (CLARITIN) 10 MG tablet Take 10 mg by mouth daily.    . metFORMIN (GLUCOPHAGE) 1000 MG tablet Take 1 tablet by mouth 2 (two) times daily with a meal.   0  . ramipril (ALTACE) 5 MG capsule Take 2 tablets (10 mg total) by mouth in the morning and 1 tablet (5 mg) in the evening 270 capsule 3  . sitaGLIPtin (JANUVIA) 100 MG tablet Take 1 tablet (100 mg total) by mouth daily. 90 tablet 0  . spironolactone (ALDACTONE) 25 MG tablet TAKE 0.5 TABLETS (12.5 MG TOTAL) BY MOUTH DAILY. 45 tablet 3   No current facility-administered medications on file prior to visit.    Allergies  Allergen Reactions  . Betadine [Povidone Iodine]   . Iodine   . Peanuts [Peanut Oil]   . Shellfish Allergy   . Other Hives and Rash    Sunlight and Grass    Family History  Problem Relation Age of Onset  . Hypertension Mother   . GI Bleed Mother   . Pneumonia Maternal Grandmother   . Cancer Paternal Grandmother     Breast cancer   PE: BP 130/82 (BP Location:  Left Arm, Patient Position: Sitting)   Pulse 86   Ht 5\' 3"  (1.6 m)   Wt 196 lb (88.9 kg)   LMP 03/06/2016   SpO2 97%   BMI 34.72 kg/m  Body mass index is 34.72 kg/m. Wt Readings from Last 3 Encounters:  03/13/16 196 lb (88.9 kg)  12/21/15 196 lb 3.2 oz (89 kg)  10/29/15 196 lb (88.9 kg)   Constitutional: overweight, in NAD Eyes: PERRLA, EOMI, no exophthalmos ENT: moist mucous membranes, no thyromegaly, no cervical lymphadenopathy Cardiovascular: RRR, No MRG Respiratory: CTA B Gastrointestinal: abdomen soft, NT, ND, BS+ Musculoskeletal: no deformities, strength intact in all 4 Skin: moist, warm,  no rashes, + acanthosis nigricans on neck Neurological: no tremor with outstretched hands, DTR normal in all 4  ASSESSMENT: 1. DM2, non-insulin-dependent, uncontrolled, with complications - MAU  Cardiologist: Dr Rennis GoldenHilty  PLAN:  1. Patient with long-standing, uncontrolled diabetes, with worse control this summer. Sugars are still above goal, with some in the 200s and one in the 300s - mostly higher after dinner. Will have her crush the Glipizide XL 10 mg before dinner and add a 1/2 tab before b'fast. I advised her to check CBGs few times during the day. - discussed exercise and reducing dietary fat - if not better control at next visit >> will need insulin - I suggested to:  Patient Instructions  Please continue: - Metformin 1000 mg 2x a day, with meals - Farxiga 10 mg in am - Tanzeum 50 mg weekly  Please add: - Glipizide 5 mg before b'fast - crush the dinnertime Glipizide XL 10 mg (or take 2 half tablets)  Please return in 3 months with your sugar log.   - continue checking sugars at different times of the day - check 1-2 times a day, rotating checks - advised for yearly eye exams >> She is UTD - today HbA1c 9.2% (higher) - Return to clinic in 3 mo with sugar log   Laurie Pavlovristina Krisalyn Yankowski, MD PhD [facility]

## 2016-03-25 ENCOUNTER — Other Ambulatory Visit: Payer: Self-pay | Admitting: Internal Medicine

## 2016-03-28 ENCOUNTER — Other Ambulatory Visit: Payer: Self-pay | Admitting: Internal Medicine

## 2016-03-28 NOTE — Telephone Encounter (Signed)
Rx(s) sent to pharmacy electronically.  

## 2016-04-03 ENCOUNTER — Telehealth: Payer: Self-pay | Admitting: Internal Medicine

## 2016-04-03 ENCOUNTER — Telehealth: Payer: Self-pay

## 2016-04-03 MED ORDER — GLIPIZIDE ER 10 MG PO TB24: 10.0000 mg | ORAL_TABLET | Freq: Every day | ORAL | 0 refills | Status: DC

## 2016-04-03 NOTE — Telephone Encounter (Signed)
Rx sent into pharmacy as requested.  

## 2016-04-03 NOTE — Telephone Encounter (Signed)
PT needs her Glipizide ER refill sent to [facility] on [address].

## 2016-04-19 ENCOUNTER — Other Ambulatory Visit: Payer: Self-pay | Admitting: Internal Medicine

## 2016-04-19 NOTE — Telephone Encounter (Signed)
Rx(s) sent to pharmacy electronically.  

## 2016-04-21 ENCOUNTER — Other Ambulatory Visit: Payer: Self-pay | Admitting: Internal Medicine

## 2016-04-21 NOTE — Telephone Encounter (Signed)
Rx(s) sent to pharmacy electronically.  

## 2016-04-28 ENCOUNTER — Encounter: Payer: Self-pay | Admitting: Internal Medicine

## 2016-04-28 ENCOUNTER — Ambulatory Visit (INDEPENDENT_AMBULATORY_CARE_PROVIDER_SITE_OTHER): Payer: Managed Care, Other (non HMO) | Admitting: Internal Medicine

## 2016-04-28 VITALS — BP 123/71 | HR 102 | Ht 62.0 in | Wt 198.8 lb

## 2016-04-28 DIAGNOSIS — I5022 Chronic systolic (congestive) heart failure: Secondary | ICD-10-CM

## 2016-04-28 DIAGNOSIS — E669 Obesity, unspecified: Secondary | ICD-10-CM

## 2016-04-28 DIAGNOSIS — I428 Other cardiomyopathies: Secondary | ICD-10-CM | POA: Diagnosis not present

## 2016-04-28 DIAGNOSIS — I10 Essential (primary) hypertension: Secondary | ICD-10-CM | POA: Diagnosis not present

## 2016-04-28 LAB — BASIC METABOLIC PANEL
BUN: 22 mg/dL (ref 7–25)
CALCIUM: 10.1 mg/dL (ref 8.6–10.2)
CO2: 26 mmol/L (ref 20–31)
CREATININE: 1.35 mg/dL — AB (ref 0.50–1.10)
Chloride: 99 mmol/L (ref 98–110)
Glucose, Bld: 176 mg/dL — ABNORMAL HIGH (ref 65–99)
Potassium: 4.7 mmol/L (ref 3.5–5.3)
SODIUM: 140 mmol/L (ref 135–146)

## 2016-04-28 MED ORDER — ATORVASTATIN CALCIUM 40 MG PO TABS: 40.0000 mg | ORAL_TABLET | Freq: Every day | ORAL | 3 refills | Status: DC

## 2016-04-28 MED ORDER — HYDROCHLOROTHIAZIDE 25 MG PO TABS: 25.0000 mg | ORAL_TABLET | Freq: Every day | ORAL | 3 refills | Status: DC

## 2016-04-28 MED ORDER — CARVEDILOL 25 MG PO TABS: 25.0000 mg | ORAL_TABLET | Freq: Two times a day (BID) | ORAL | 3 refills | Status: DC

## 2016-04-28 MED ORDER — RAMIPRIL 5 MG PO CAPS: ORAL_CAPSULE | ORAL | 3 refills | Status: DC

## 2016-04-28 MED ORDER — DIGOXIN 250 MCG PO TABS: ORAL_TABLET | ORAL | 3 refills | Status: DC

## 2016-04-28 MED ORDER — SPIRONOLACTONE 25 MG PO TABS: 12.5000 mg | ORAL_TABLET | Freq: Every day | ORAL | 3 refills | Status: DC

## 2016-04-28 NOTE — Progress Notes (Signed)
OFFICE NOTE  Chief Complaint:  Routine followup   Primary Care Physician: Cain Saupe, MD  HPI:  Laurie Small is a 40 year old African American female with a history of nonischemic cardiomyopathy, thought probably virally mediated, with cardiac catheterization in 2003 that showed an EF of 25% to 30% and no obstructive coronary disease. In 2011, her EF was 45% to 50% by echocardiogram with medical therapy. Unfortunately, after her initial diagnosis, she developed a pulmonary embolus in 2003 and was on Coumadin for about 2 years, and now is on aspirin 325 mg daily. She also has diabetes, hypertension, and obesity. She recently she tripped in her house and developed a small stress fracture and sprain of her left ankle which she has recovered from. She denies any shortness of breath with exertion, chest pain, palpitations, presyncope, syncopal symptoms.  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 She continues to feel at baseline. Interestingly, her heart rate is lower than it was at her last office visit.  Laurie Small returns today for followup. She reports doing well denies any shortness of breath or chest pain with exertion. Unfortunately she's not been able to lose any significant amount of weight. She has had some adjustments in her diabetes medicine with the addition of glipizide, she is also taking Onglyza and metformin 1000 mg twice daily.  She is interested in possibly getting off of some of her medications.  Saw Laurie Small back in the office today. Overall she is doing well without any symptoms of heart failure. Blood sugar has been better controlled. Weight appears to be down just a few pounds. EKG looks normal today. Her last echocardiogram however did not show significant improvement in EF, therefore I recommended we remain on her current heart failure medications. Currently she has NYHA class I symptoms.  04/28/2016  Laurie Small returns  today for follow-up. In the past year she's done fairly well. She denies any chest pain or worsening shortness of breath. Unfortunately she's been struggling with left plantar fasciitis and she is in a walking boot today. She's actually lost 46 pounds since we last saw her. She continues to have class I heart failure symptoms. Interestingly she is tachycardic today. Initially this was thought to be due to her rushing in the office however her heart rate remained elevated even at the end of the visit. She reports compliance with her beta blocker which is actually a high dose. I don't know if this is related to worsening heart failure with sympathetic activation. She was supposed to have an echo a year ago but we did not obtain that and I like to reassess LV function since his been 2 years since her last study. Her last EF was 40-45%. Stress new heart failure treatments that are available today including Entresto and Corlanor - which may be of some benefit.  PMHx:  Past Medical History:  Diagnosis Date  . Depression   . NICM (nonischemic cardiomyopathy) (HCC)    2D ECHO, 12/26/2011 - EF 45-50%, left ventricle borderline dilated    Past Surgical History:  Procedure Laterality Date  . CARDIAC CATHETERIZATION  01/06/2002   Dilated nonsichemic cardiopmyopathy, EF 28%, at least 2+ angiographic mitral regurgitation    FAMHx:  Family History  Problem Relation Age of Onset  . Hypertension Mother   . GI Bleed Mother   . Pneumonia Maternal Grandmother   . Cancer Paternal Grandmother     Breast cancer    SOCHx:   reports that  she has never smoked. She does not have any smokeless tobacco history on file. She reports that she does not drink alcohol or use drugs.  ALLERGIES:  Allergies  Allergen Reactions  . Betadine [Povidone Iodine]   . Iodine   . Peanuts [Peanut Oil]   . Shellfish Allergy   . Other Hives and Rash    Sunlight and Grass     ROS: A comprehensive review of systems was  negative.  HOME MEDS: Current Outpatient Prescriptions  Medication Sig Dispense Refill  . aspirin 325 MG tablet Take 325 mg by mouth daily.    Marland Kitchen. atorvastatin (LIPITOR) 40 MG tablet Take 1 tablet (40 mg total) by mouth daily at 6 PM. 90 tablet 3  . carvedilol (COREG) 25 MG tablet Take 1 tablet (25 mg total) by mouth 2 (two) times daily with a meal. 180 tablet 3  . digoxin (LANOXIN) 0.25 MG tablet Takes 1 tablets by mouth daily and alternate with 1/2 tablet every other day 90 tablet 3  . diphenhydrAMINE (BENADRYL) 25 MG tablet Take 25 mg by mouth daily.    Marland Kitchen. FARXIGA 10 MG TABS tablet TAKE 1 TABLET BY MOUTH DAILY 90 tablet 0  . fluconazole (DIFLUCAN) 150 MG tablet     . glipiZIDE (GLUCOTROL XL) 10 MG 24 hr tablet Take 1 tablet (10 mg total) by mouth daily. Take 5 mg in am and 10 mg in pm (crushed) 90 tablet 0  . hydrochlorothiazide (HYDRODIURIL) 25 MG tablet Take 1 tablet (25 mg total) by mouth daily. 90 tablet 3  . hydrocortisone 2.5 % cream as needed.    . Insulin Pen Needle 32G X 4 MM MISC Use as directed. 30 each 2  . loratadine (CLARITIN) 10 MG tablet Take 10 mg by mouth daily.    . metFORMIN (GLUCOPHAGE) 1000 MG tablet Take 1 tablet by mouth 2 (two) times daily with a meal.   0  . ramipril (ALTACE) 5 MG capsule Take 2 tablets (10 mg total) by mouth in the morning and 1 tablet (5 mg) in the evening 270 capsule 3  . spironolactone (ALDACTONE) 25 MG tablet Take 0.5 tablets (12.5 mg total) by mouth daily. 45 tablet 3  . TANZEUM 50 MG PEN INJECT 50 MG INTO THE SKIN ONCE A WEEK. 12 each 1   No current facility-administered medications for this visit.     LABS/IMAGING: No results found for this or any previous visit (from the past 48 hour(s)). No results found.  VITALS: BP 123/71   Pulse (!) 102   Ht 5\' 2"  (1.575 m)   Wt 198 lb 12.8 oz (90.2 kg)   BMI 36.36 kg/m   EXAM: General appearance: alert and no distress Neck: no adenopathy, no carotid bruit, no JVD, supple, symmetrical,  trachea midline and thyroid not enlarged, symmetric, no tenderness/mass/nodules Lungs: clear to auscultation bilaterally Heart: regular rate and rhythm, S1, S2 normal, no murmur, click, rub or gallop Abdomen: OBese Extremities: extremities normal, atraumatic, no cyanosis or edema Pulses: 2+ and symmetric Skin: Skin color, texture, turgor normal. No rashes or lesions Neurologic: Grossly normal  EKG: Sinus tachycardia at 102  ASSESSMENT: 1. Nonischemic cardiomyopathy, EF 40-45% (2015) - NYHA class I symptoms 2. History of pulmonary embolism 3. Diabetes type 2 - controlled 4. Hypertension - controlled 5. Obesity  PLAN: 1.    Laurie Small appears to be well compensated, however, HR is elevated today. Blood pressures at goal. Her diabetes is well improved. Weight is down. EF was  40-45% when last checked in 2015 - she did not get her echo last year. I will repeat that now. Based on the findings, we may need to consider switching her ACE-I over to [facility] +/- adding Corlanor if she remains tachycardic. She also reports recently her primary care provider check the digoxin level and it was elevated. We will need to reassess that today. I have a low threshold for discontinuing this as there is likely little utility given the current heart failure medicines that are available for continued use of digoxin other than symptomatic relief.  Follow-up with me after her echo for medication management.  Chrystie Nose, MD, [facility] Attending Cardiologist [facility]  Chrystie Nose 04/28/2016, 5:40 PM

## 2016-04-28 NOTE — Patient Instructions (Addendum)
Medication Instructions:  Continue current medications  Labwork: Digoxin level and BMP  Testing/Procedures: Your physician has requested that you have an echocardiogram. Echocardiography is a painless test that uses sound waves to create images of your heart. It provides your doctor with information about the size and shape of your heart and how well your heart's chambers and valves are working. This procedure takes approximately one hour. There are no restrictions for this procedure.  Follow-Up: Your physician recommends that you schedule a follow-up appointment in: 6 Weeks   Any Other Special Instructions Will Be Listed Below (If Applicable).   If you need a refill on your cardiac medications before your next appointment, please call your pharmacy.

## 2016-04-29 LAB — DIGOXIN LEVEL: DIGOXIN LVL: 0.9 ug/L (ref 0.8–2.0)

## 2016-04-30 ENCOUNTER — Other Ambulatory Visit: Payer: Self-pay | Admitting: Internal Medicine

## 2016-05-05 ENCOUNTER — Telehealth: Payer: Self-pay | Admitting: *Deleted

## 2016-05-05 NOTE — Telephone Encounter (Signed)
-----   Message from Chrystie Nose, MD sent at 05/05/2016  4:09 PM EDT ----- Digoxin level is slightly high for heart failure. Recommend d/c digoxin permanently. Creatinine is elevated - encourage hydration. She is on aldactone, but not lasix. Await follow-up echo.  Dr. Rexene Edison

## 2016-05-05 NOTE — Telephone Encounter (Signed)
Unable to reach pt or leave a message  

## 2016-05-05 NOTE — Telephone Encounter (Signed)
Left message on mobile phone to stop the digoxin and to call about kidney function and confirm she got the message.

## 2016-05-08 NOTE — Telephone Encounter (Signed)
Spoke with pt, she had not received the message that was left. Patient voiced understanding to stop the digoxin, increase fluids and keep appointment for echo.

## 2016-05-12 ENCOUNTER — Ambulatory Visit (HOSPITAL_COMMUNITY): Payer: Managed Care, Other (non HMO) | Attending: Cardiology

## 2016-05-12 ENCOUNTER — Other Ambulatory Visit: Payer: Self-pay

## 2016-05-12 DIAGNOSIS — I428 Other cardiomyopathies: Secondary | ICD-10-CM

## 2016-05-23 ENCOUNTER — Ambulatory Visit (INDEPENDENT_AMBULATORY_CARE_PROVIDER_SITE_OTHER): Payer: Managed Care, Other (non HMO) | Admitting: Podiatry

## 2016-05-23 ENCOUNTER — Ambulatory Visit (INDEPENDENT_AMBULATORY_CARE_PROVIDER_SITE_OTHER): Payer: Managed Care, Other (non HMO)

## 2016-05-23 ENCOUNTER — Encounter: Payer: Self-pay | Admitting: Podiatry

## 2016-05-23 VITALS — BP 117/69 | HR 101 | Resp 16

## 2016-05-23 DIAGNOSIS — M79672 Pain in left foot: Secondary | ICD-10-CM

## 2016-05-23 DIAGNOSIS — M10372 Gout due to renal impairment, left ankle and foot: Secondary | ICD-10-CM | POA: Diagnosis not present

## 2016-05-23 LAB — CBC WITH DIFFERENTIAL/PLATELET
BASOS PCT: 0 %
Basophils Absolute: 0 cells/uL (ref 0–200)
EOS ABS: 192 {cells}/uL (ref 15–500)
Eosinophils Relative: 2 %
HEMATOCRIT: 38.3 % (ref 35.0–45.0)
HEMOGLOBIN: 12.1 g/dL (ref 11.7–15.5)
LYMPHS ABS: 2304 {cells}/uL (ref 850–3900)
LYMPHS PCT: 24 %
MCH: 21.6 pg — ABNORMAL LOW (ref 27.0–33.0)
MCHC: 31.6 g/dL — ABNORMAL LOW (ref 32.0–36.0)
MCV: 68.4 fL — AB (ref 80.0–100.0)
MONO ABS: 480 {cells}/uL (ref 200–950)
MPV: 10.3 fL (ref 7.5–12.5)
Monocytes Relative: 5 %
Neutro Abs: 6624 cells/uL (ref 1500–7800)
Neutrophils Relative %: 69 %
Platelets: 367 10*3/uL (ref 140–400)
RBC: 5.6 MIL/uL — AB (ref 3.80–5.10)
RDW: 17.3 % — AB (ref 11.0–15.0)
WBC: 9.6 10*3/uL (ref 3.8–10.8)

## 2016-05-23 MED ORDER — COLCHICINE 0.6 MG PO TABS: 0.6000 mg | ORAL_TABLET | Freq: Every day | ORAL | 1 refills | Status: DC

## 2016-05-23 NOTE — Progress Notes (Signed)
   Subjective:    Patient ID: Laurie Small, female    DOB: 12/09/1975, 40 y.o.   MRN: [id number]  HPI: She presents today with chief complaint of pain to the first metatarsal and second metatarsophalangeal joints of the left foot. States this been going on for about 3 weeks and seems to be better than it was. She denies any trauma to the area but states that he come on suddenly and has been slow to leave. She's been utilizing a cam walker that she had purchased here before which makes it feel much better especially since she cannot get her foot into a shoe. She denies history of gout but does relate a history of diabetes mellitus and some recent kidney issues.    Review of Systems  All other systems reviewed and are negative.      Objective:   Physical Exam: Vital signs are stable alert and oriented 3. Pulses are strongly palpable. Neurologic sensorium is intact. Deep tendon reflexes are intact muscle strength is normal. Orthopedic evaluation demonstrates a red hot swollen joint left. No signs of cellulitis and no signs of sepsis. Radiographs do not demonstrate any type of major osseous mallet and hallux valgus deformity.        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  Plan: I injected the area around the first metatarsophalangeal joint today with Kenalog and local anesthetic. Placed her back in her cam walker. Placed her on colchicine 0.6 mg 1 daily and I requested a an arthritic panel. Follow up with her in a couple weeks to make sure this has resolved. We also dispensed a short walking boot to her request.

## 2016-05-24 LAB — SEDIMENTATION RATE: Sed Rate: 12 mm/hr (ref 0–20)

## 2016-05-24 LAB — URIC ACID: Uric Acid, Serum: 8.6 mg/dL — ABNORMAL HIGH (ref 2.5–7.0)

## 2016-05-24 LAB — ANA: Anti Nuclear Antibody(ANA): POSITIVE — AB

## 2016-05-24 LAB — ANTI-NUCLEAR AB-TITER (ANA TITER)

## 2016-05-24 LAB — RHEUMATOID FACTOR

## 2016-05-24 LAB — C-REACTIVE PROTEIN: CRP: 15.5 mg/L — AB (ref <8.0)

## 2016-05-26 ENCOUNTER — Telehealth: Payer: Self-pay | Admitting: *Deleted

## 2016-05-26 DIAGNOSIS — M109 Gout, unspecified: Secondary | ICD-10-CM

## 2016-05-26 NOTE — Telephone Encounter (Addendum)
-----   Message from Elinor Parkinson, [place] sent at 05/24/2016  5:54 PM EDT ----- Please inform this patient that it does appear that she has gout with an elevated uric acid.  However she has several other markers that are elevated and I feel that she should be seen by a Rheumatologist.  Please make her an appointment with Dr. Dareen Piano (rheumatology) and send a copy of the labs. 05/26/2016-Referral, pt clinicals and Demographics faxed to [facility].

## 2016-06-05 ENCOUNTER — Other Ambulatory Visit: Payer: Self-pay | Admitting: Family

## 2016-06-05 ENCOUNTER — Other Ambulatory Visit: Payer: Self-pay | Admitting: Family Medicine

## 2016-06-05 DIAGNOSIS — Z1231 Encounter for screening mammogram for malignant neoplasm of breast: Secondary | ICD-10-CM

## 2016-06-13 ENCOUNTER — Ambulatory Visit (INDEPENDENT_AMBULATORY_CARE_PROVIDER_SITE_OTHER): Payer: Managed Care, Other (non HMO) | Admitting: Internal Medicine

## 2016-06-13 ENCOUNTER — Encounter: Payer: Self-pay | Admitting: Internal Medicine

## 2016-06-13 VITALS — BP 120/78 | HR 95 | Ht 62.0 in | Wt 198.2 lb

## 2016-06-13 DIAGNOSIS — R809 Proteinuria, unspecified: Secondary | ICD-10-CM

## 2016-06-13 DIAGNOSIS — E1129 Type 2 diabetes mellitus with other diabetic kidney complication: Secondary | ICD-10-CM | POA: Diagnosis not present

## 2016-06-13 LAB — POCT GLYCOSYLATED HEMOGLOBIN (HGB A1C): Hemoglobin A1C: 8.2

## 2016-06-13 NOTE — Patient Instructions (Addendum)
Please continue: - Metformin 1000 mg 2x a day, with meals - Farxiga 10 mg in am - Tanzeum 50 mg weekly - Glipizide XL 10 mg before b'fast + 2.5 mg - move this before dinner  Please return in 3 months with your sugar log.   KEEP UP THE GREAT JOB!

## 2016-06-13 NOTE — Progress Notes (Signed)
Patient ID: Laurie Small, female   DOB: 07-Jul-1976, 40 y.o.   MRN: [id number]  HPI: Laurie Small is a 40 y.o.-year-old female, returning for f/u for DM2, dx in 2003 (after an episode of myocarditis), non-insulin-dependent, uncontrolled, with complications (microalbuminuria). Last visit 3 mo ago.  Last hemoglobin A1c was: Lab Results  Component Value Date   HGBA1C 9.2 03/13/2016   HGBA1C 8.8 12/21/2015   HGBA1C 8.9 09/14/2015  05/31/2015: HbA1c 10% 09/15/2014: HbA1c 9.6% 04/23/2014: HbA1c 10.9% 01/20/2014: HbA1c 12.9%  Pt is on a regimen of: - Metformin 1000 mg 2x a day, with meals - Glipizide ER XL 10 mg in am and 2.5 mg at bedtime (!) - Farxiga 10 mg in am - Trulicity 1.5 mg weekly >> Tanzeum 50 mg weekly - changed 10/2015 2/2 insurance coverage - she does not like the Tanzeum as much She was on Onglyza (too expensive).  She was on JanuMet (stopped being effective), then Januvia >> stopped when starting Trulicity She was on Victoza (trial period). She refused insulin.  Pt checks her sugars 1x a day and they are: - am: 135-211 (200s is she ate sweets the night before) >> 125-166, 180, 276 >> 62, 72-123, 144 - 2h after b'fast: n/c >> 198-292 >> n/c - before lunch: n/c >> 178, 187 >> 176 >> n/c - 2h after lunch: n/c >> n/c >> 136 >> n/c >> 165 - before dinner: n/c >> 136-205 >> n/c - 2h after dinner: n/c >> 142, 224 >> n/c >> 146, 180-225, 334 (sweet tea) >> 200, 213, 217 - bedtime: n/c >> 62-134 - nighttime: n/c >> 85-142, 195 No lows. Lowest sugar was 135 >> 62; ? hypoglycemia awareness.  Highest sugar was 334 >> 217.  Glucometer: FreeStyle Ultra mini >> Relion  Pt's meals are: - Breakfast: fruit, yoghurt, oatmeal, eggs, grits, bacon - Lunch: pasta, sandwich, salad - Dinner: meat + veggies - Snacks: 3 She works from home. She walks for exercise - laps  - no CKD, last BUN/creatinine:  05/31/2015: 13/0.91 Lab Results  Component Value Date   BUN 22  04/28/2016   CREATININE 1.35 (H) 04/28/2016  ACR  (09/15/2014): 44.4 ACR (01/20/2014): 79.9 On Ramipril. - last set of lipids (monitored by cardiology): 03/31/2016: 136/69/49/73 06/12/2014: 147/118/44/80 Lab Results  Component Value Date   CHOL 158 01/06/2013   HDL 67 01/06/2013   LDLCALC 66 01/06/2013   TRIG 123 01/06/2013   CHOLHDL 2.4 01/06/2013  On Lipitor. - last eye exam was in 07/23/2015. No DR.  - no numbness and tingling in her feet. Heel spurs.  She also has a history of hypertension, hyperlipidemia, history of pulmonary embolism. She has had parathyroid surgery in 2001.  She was recently taken off Digoxin. She also started Colchicine for suspected gout. She will see rheumatology tomorrow. She got a foot steroid injection.  ROS: Constitutional: no weight gain/loss, no fatigue Eyes: no blurry vision, no xerophthalmia ENT: no sore throat, no nodules palpated in throat, no dysphagia/odynophagia, no hoarseness Cardiovascular: no CP/SOB/palpitations/leg swelling Respiratory: no cough/SOB Gastrointestinal: no N/V/D/C/heartburn Musculoskeletal: no muscle/joint aches Skin: no Rash Neurological: no tremors/numbness/tingling/dizziness  I reviewed pt's medications, allergies, PMH, social hx, family hx, and changes were documented in the history of present illness. Otherwise, unchanged from my initial visit note.  Past Medical History:  Diagnosis Date  . Depression   . NICM (nonischemic cardiomyopathy) (HCC)    2D ECHO, 12/26/2011 - EF 45-50%, left ventricle borderline dilated   Past Surgical History:  Procedure Laterality  Date  . CARDIAC CATHETERIZATION  01/06/2002   Dilated nonsichemic cardiopmyopathy, EF 28%, at least 2+ angiographic mitral regurgitation   Social History   Social History  . Marital Status: Married    Spouse Name: N/A  . Number of Children: 1   Occupational History  .  claims benefits specialist    Social History Main Topics  . Smoking status:  Never Smoker   . Smokeless tobacco: Not on file  . Alcohol Use: No  . Drug Use: No   Current Outpatient Prescriptions on File Prior to Visit  Medication Sig Dispense Refill  . aspirin 325 MG tablet Take 325 mg by mouth daily.    Marland Kitchen. atorvastatin (LIPITOR) 40 MG tablet Take 1 tablet (40 mg total) by mouth daily at 6 PM. 90 tablet 3  . carvedilol (COREG) 25 MG tablet Take 1 tablet (25 mg total) by mouth 2 (two) times daily with a meal. 180 tablet 3  . colchicine 0.6 MG tablet Take 1 tablet (0.6 mg total) by mouth daily. 30 tablet 1  . diphenhydrAMINE (BENADRYL) 25 MG tablet Take 25 mg by mouth daily.    Marland Kitchen. FARXIGA 10 MG TABS tablet TAKE 1 TABLET BY MOUTH DAILY 90 tablet 0  . fluconazole (DIFLUCAN) 150 MG tablet     . glipiZIDE (GLUCOTROL XL) 10 MG 24 hr tablet Take 1 tablet (10 mg total) by mouth daily. Take 5 mg in am and 10 mg in pm (crushed) 90 tablet 0  . hydrochlorothiazide (HYDRODIURIL) 25 MG tablet Take 1 tablet (25 mg total) by mouth daily. 90 tablet 3  . hydrocortisone 2.5 % cream as needed.    . Insulin Pen Needle 32G X 4 MM MISC Use as directed. 30 each 2  . loratadine (CLARITIN) 10 MG tablet Take 10 mg by mouth daily.    . metFORMIN (GLUCOPHAGE) 1000 MG tablet Take 1 tablet by mouth 2 (two) times daily with a meal.   0  . ramipril (ALTACE) 5 MG capsule Take 2 tablets (10 mg total) by mouth in the morning and 1 tablet (5 mg) in the evening 270 capsule 3  . spironolactone (ALDACTONE) 25 MG tablet Take 0.5 tablets (12.5 mg total) by mouth daily. 45 tablet 3  . TANZEUM 50 MG PEN INJECT 50 MG INTO THE SKIN ONCE A WEEK. 12 each 1   No current facility-administered medications on file prior to visit.    Allergies  Allergen Reactions  . Betadine [Povidone Iodine]   . Iodine   . Peanuts [Peanut Oil]   . Shellfish Allergy   . Other Hives and Rash    Sunlight and Grass    Family History  Problem Relation Age of Onset  . Hypertension Mother   . GI Bleed Mother   . Pneumonia  Maternal Grandmother   . Cancer Paternal Grandmother     Breast cancer   PE: BP 120/78   Pulse 95   Ht 5\' 2"  (1.575 m)   Wt 198 lb 3.2 oz (89.9 kg)   SpO2 96%   BMI 36.25 kg/m  Body mass index is 36.25 kg/m. Wt Readings from Last 3 Encounters:  06/13/16 198 lb 3.2 oz (89.9 kg)  04/28/16 198 lb 12.8 oz (90.2 kg)  03/13/16 196 lb (88.9 kg)   Constitutional: overweight, in NAD Eyes: PERRLA, EOMI, no exophthalmos ENT: moist mucous membranes, no thyromegaly, no cervical lymphadenopathy Cardiovascular: tachycardia, RR, No MRG Respiratory: CTA B Gastrointestinal: abdomen soft, NT, ND, BS+ Musculoskeletal: no deformities,  strength intact in all 4 Skin: moist, warm, no rashes, + acanthosis nigricans on neck Neurological: no tremor with outstretched hands, DTR normal in all 4  ASSESSMENT: 1. DM2, non-insulin-dependent, uncontrolled, with complications - MAU  Cardiologist: Dr Rennis Golden  PLAN:  1. Patient with long-standing, uncontrolled diabetes, with worse control this summer. We added dinnertime glipizide at last visit - she tells me he actually takes it at bedtime >> will move it before dinner. We also discussed about improving her diet at last visit >> sugars are now much better! - I suggested to:  Patient Instructions  Please continue: - Metformin 1000 mg 2x a day, with meals - Farxiga 10 mg in am - Tanzeum 50 mg weekly - Glipizide XL 10 mg before b'fast + 2.5 mg - move this before dinner  Please return in 3 months with your sugar log.   KEEP UP THE GREAT JOB!  - continue checking sugars at different times of the day - check 1-2 times a day, rotating checks - advised for yearly eye exams >> She is UTD - today HbA1c 8.2% (better) - Return to clinic in 3 mo with sugar log   Carlus Pavlov, MD PhD [facility] Endocrinology

## 2016-06-19 ENCOUNTER — Other Ambulatory Visit: Payer: Self-pay | Admitting: Internal Medicine

## 2016-06-20 ENCOUNTER — Encounter: Payer: Self-pay | Admitting: Podiatry

## 2016-06-20 ENCOUNTER — Ambulatory Visit (INDEPENDENT_AMBULATORY_CARE_PROVIDER_SITE_OTHER): Payer: Managed Care, Other (non HMO) | Admitting: Podiatry

## 2016-06-20 DIAGNOSIS — M109 Gout, unspecified: Secondary | ICD-10-CM

## 2016-06-21 ENCOUNTER — Encounter: Payer: Self-pay | Admitting: Internal Medicine

## 2016-06-21 ENCOUNTER — Ambulatory Visit (INDEPENDENT_AMBULATORY_CARE_PROVIDER_SITE_OTHER): Payer: Managed Care, Other (non HMO) | Admitting: Internal Medicine

## 2016-06-21 VITALS — BP 124/96 | HR 99 | Ht 64.0 in | Wt 200.6 lb

## 2016-06-21 DIAGNOSIS — I428 Other cardiomyopathies: Secondary | ICD-10-CM | POA: Diagnosis not present

## 2016-06-21 DIAGNOSIS — I5022 Chronic systolic (congestive) heart failure: Secondary | ICD-10-CM | POA: Diagnosis not present

## 2016-06-21 DIAGNOSIS — R000 Tachycardia, unspecified: Secondary | ICD-10-CM

## 2016-06-21 DIAGNOSIS — I10 Essential (primary) hypertension: Secondary | ICD-10-CM | POA: Diagnosis not present

## 2016-06-21 NOTE — Patient Instructions (Addendum)
Medication Instructions:  Your physician recommends that you continue on your current medications as directed. Please refer to the Current Medication list given to you today.  Labwork: None   Testing/Procedures: Your physician has recommended that you wear a 48 HOUR HOLTER MONITOR. Holter monitors are medical devices that record the heart's electrical activity. Doctors most often use these monitors to diagnose arrhythmias. Arrhythmias are problems with the speed or rhythm of the heartbeat. The monitor is a small, portable device. You can wear one while you do your normal daily activities. This is usually used to diagnose what is causing palpitations/syncope (passing out).   Follow-Up: Your physician recommends that you schedule a follow-up appointment in: 1 MONTH WITH DR HILTY. (AT LEAST 2 WEEKS AFTER MONITOR)  Any Other Special Instructions Will Be Listed Below (If Applicable).     If you need a refill on your cardiac medications before your next appointment, please call your pharmacy.

## 2016-06-21 NOTE — Progress Notes (Signed)
She presents today for follow-up of her capsulitis and gout first metatarsophalangeal joint left foot. She states that it's 100% improved at this point. We had sent her to rheumatology. She did not like the rheumatologist that we send her to so we are going to send her to Dr. Lendon Colonel.  Objective: Vital signs are stable to alert and oriented 3. Pulses are palpable. Postinflammatory hyperpigmentation is noted. No tenderness on range of motion or palpation of the first metatarsophalangeal joint left foot.  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  Plan: Follow-up with rheumatology.

## 2016-06-21 NOTE — Progress Notes (Signed)
OFFICE NOTE  Chief Complaint:  Routine followup   Primary Care Physician: Cain Saupe, MD  HPI:  Laurie Small is a 40 year old African American female with a history of nonischemic cardiomyopathy, thought probably virally mediated, with cardiac catheterization in 2003 that showed an EF of 25% to 30% and no obstructive coronary disease. In 2011, her EF was 45% to 50% by echocardiogram with medical therapy. Unfortunately, after her initial diagnosis, she developed a pulmonary embolus in 2003 and was on Coumadin for about 2 years, and now is on aspirin 325 mg daily. She also has diabetes, hypertension, and obesity. She recently she tripped in her house and developed a small stress fracture and sprain of her left ankle which she has recovered from. She denies any shortness of breath with exertion, chest pain, palpitations, presyncope, syncopal symptoms.  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 She continues to feel at baseline. Interestingly, her heart rate is lower than it was at her last office visit.  [name] returns today for followup. She reports doing well denies any shortness of breath or chest pain with exertion. Unfortunately she's not been able to lose any significant amount of weight. She has had some adjustments in her diabetes medicine with the addition of glipizide, she is also taking Onglyza and metformin 1000 mg twice daily.  She is interested in possibly getting off of some of her medications.  Saw [name] back in the office today. Overall she is doing well without any symptoms of heart failure. Blood sugar has been better controlled. Weight appears to be down just a few pounds. EKG looks normal today. Her last echocardiogram however did not show significant improvement in EF, therefore I recommended we remain on her current heart failure medications. Currently she has NYHA class I symptoms.  04/28/2016  [name] returns  today for follow-up. In the past year she's done fairly well. She denies any chest pain or worsening shortness of breath. Unfortunately she's been struggling with left plantar fasciitis and she is in a walking boot today. She's actually lost 46 pounds since we last saw her. She continues to have class I heart failure symptoms. Interestingly she is tachycardic today. Initially this was thought to be due to her rushing in the office however her heart rate remained elevated even at the end of the visit. She reports compliance with her beta blocker which is actually a high dose. I don't know if this is related to worsening heart failure with sympathetic activation. She was supposed to have an echo a year ago but we did not obtain that and I like to reassess LV function since his been 2 years since her last study. Her last EF was 40-45%. Stress new heart failure treatments that are available today including Entresto and Corlanor - which may be of some benefit.  06/21/2016  [name] was seen in follow-up today. A repeat echo still shows her LVEF to be around 40%. She remains persistently tachycardiac with HR in the upper 90's and low 100's. This appears to be sinus. Blood pressure runs in the 110-120 systolic range and there is little room to increase her carvedilol which is already at 25 mg BID. She was on digoxin, but it did not seem to affect her heartrate, so I discontinued it. She may be a candidate for Corlanor. A big question is whether she has persistent, inappropriate sinus tachycardia and whether this is contributing to her cardiomyopathy.  PMHx:  Past  Medical History:  Diagnosis Date  . Depression   . NICM (nonischemic cardiomyopathy) (HCC)    2D ECHO, 12/26/2011 - EF 45-50%, left ventricle borderline dilated    Past Surgical History:  Procedure Laterality Date  . CARDIAC CATHETERIZATION  01/06/2002   Dilated nonsichemic cardiopmyopathy, EF 28%, at least 2+ angiographic mitral  regurgitation    FAMHx:  Family History  Problem Relation Age of Onset  . Hypertension Mother   . GI Bleed Mother   . Pneumonia Maternal Grandmother   . Cancer Paternal Grandmother     Breast cancer    SOCHx:   reports that she has never smoked. She does not have any smokeless tobacco history on file. She reports that she does not drink alcohol or use drugs.  ALLERGIES:  Allergies  Allergen Reactions  . Betadine [Povidone Iodine]   . Iodine   . Peanuts [Peanut Oil]   . Shellfish Allergy   . Other Hives and Rash    Sunlight and Grass     ROS: Pertinent items noted in HPI and remainder of comprehensive ROS otherwise negative.  HOME MEDS: Current Outpatient Prescriptions  Medication Sig Dispense Refill  . aspirin 325 MG tablet Take 325 mg by mouth daily.    Marland Kitchen atorvastatin (LIPITOR) 40 MG tablet Take 1 tablet (40 mg total) by mouth daily at 6 PM. 90 tablet 3  . carvedilol (COREG) 25 MG tablet Take 1 tablet (25 mg total) by mouth 2 (two) times daily with a meal. 180 tablet 3  . colchicine 0.6 MG tablet Take 1 tablet (0.6 mg total) by mouth daily. 30 tablet 1  . diphenhydrAMINE (BENADRYL) 25 MG tablet Take 25 mg by mouth daily.    Marland Kitchen FARXIGA 10 MG TABS tablet TAKE 1 TABLET BY MOUTH DAILY 90 tablet 0  . fluconazole (DIFLUCAN) 150 MG tablet     . glipiZIDE (GLUCOTROL XL) 10 MG 24 hr tablet Take 1 tablet (10 mg total) by mouth daily. Take 5 mg in am and 10 mg in pm (crushed) 90 tablet 0  . hydrochlorothiazide (HYDRODIURIL) 25 MG tablet Take 1 tablet (25 mg total) by mouth daily. 90 tablet 3  . hydrocortisone 2.5 % cream as needed.    . Insulin Pen Needle 32G X 4 MM MISC Use as directed. 30 each 2  . loratadine (CLARITIN) 10 MG tablet Take 10 mg by mouth daily.    . metFORMIN (GLUCOPHAGE) 1000 MG tablet Take 1 tablet by mouth 2 (two) times daily with a meal.   0  . ramipril (ALTACE) 5 MG capsule Take 2 tablets (10 mg total) by mouth in the morning and 1 tablet (5 mg) in the  evening 270 capsule 3  . spironolactone (ALDACTONE) 25 MG tablet Take 0.5 tablets (12.5 mg total) by mouth daily. 45 tablet 3  . TANZEUM 50 MG PEN INJECT 50 MG INTO THE SKIN ONCE A WEEK. 12 each 1   No current facility-administered medications for this visit.     LABS/IMAGING: No results found for this or any previous visit (from the past 48 hour(s)). No results found.  VITALS: BP (!) 124/96   Pulse 99   Ht 5\' 4"  (1.626 m)   Wt 200 lb 9.6 oz (91 kg)   SpO2 98%   BMI 34.43 kg/m   EXAM: Deferred  EKG: Sinus rhythm at 99  ASSESSMENT: 1. Nonischemic cardiomyopathy, EF 40% (2017) - NYHA class I symptoms 2. History of pulmonary embolism 3. Diabetes type 2 -  controlled 4. Hypertension - controlled 5. Obesity  PLAN: 1.    Ms. Melinda CrutchWalden-Banks appears to be well compensated, however, HR remains elevated. Blood pressures at goal. She may be a candidate to add Corlanor for treatment of her persistent tachycardia if HR is shown to be elevated, particularly at night. Would recommend a 48 hour monitor. If she has persistent tachycardia, then would start Corlanor 5 mg BID - can uptitrate as tolerated to HR in the 60-70 range.  Follow-up with me after her monitor for medication management.  Chrystie NoseKenneth C. Bri Wakeman, MD, [facility] CountyFACC Attending Cardiologist [facility]  Lisette AbuKenneth C [facility] 06/21/2016, 8:08 PM

## 2016-06-26 ENCOUNTER — Ambulatory Visit
Admission: RE | Admit: 2016-06-26 | Discharge: 2016-06-26 | Disposition: A | Discharge status: Home / Self Care | Payer: Managed Care, Other (non HMO) | Source: Ambulatory Visit | Attending: Family | Admitting: Family

## 2016-06-26 DIAGNOSIS — Z1231 Encounter for screening mammogram for malignant neoplasm of breast: Secondary | ICD-10-CM

## 2016-06-27 ENCOUNTER — Other Ambulatory Visit: Payer: Self-pay | Admitting: Internal Medicine

## 2016-07-03 ENCOUNTER — Ambulatory Visit (INDEPENDENT_AMBULATORY_CARE_PROVIDER_SITE_OTHER): Payer: Managed Care, Other (non HMO)

## 2016-07-03 DIAGNOSIS — R000 Tachycardia, unspecified: Secondary | ICD-10-CM | POA: Diagnosis not present

## 2016-07-04 ENCOUNTER — Other Ambulatory Visit: Payer: Self-pay | Admitting: Internal Medicine

## 2016-07-04 DIAGNOSIS — I5022 Chronic systolic (congestive) heart failure: Secondary | ICD-10-CM

## 2016-07-04 NOTE — Telephone Encounter (Signed)
REFILL 

## 2016-07-28 ENCOUNTER — Encounter: Payer: Self-pay | Admitting: Internal Medicine

## 2016-07-28 ENCOUNTER — Ambulatory Visit (INDEPENDENT_AMBULATORY_CARE_PROVIDER_SITE_OTHER): Payer: Managed Care, Other (non HMO) | Admitting: Internal Medicine

## 2016-07-28 VITALS — BP 134/78 | HR 92 | Ht 63.0 in | Wt 205.4 lb

## 2016-07-28 DIAGNOSIS — I428 Other cardiomyopathies: Secondary | ICD-10-CM | POA: Diagnosis not present

## 2016-07-28 DIAGNOSIS — I5022 Chronic systolic (congestive) heart failure: Secondary | ICD-10-CM | POA: Diagnosis not present

## 2016-07-28 DIAGNOSIS — R000 Tachycardia, unspecified: Secondary | ICD-10-CM | POA: Diagnosis not present

## 2016-07-28 MED ORDER — CARVEDILOL 25 MG PO TABS: 37.5000 mg | ORAL_TABLET | Freq: Two times a day (BID) | ORAL | 1 refills | Status: DC

## 2016-07-28 NOTE — Patient Instructions (Signed)
Your physician has recommended you make the following change in your medication:  1. INCREASE carvedilol to 37.5 mg twice daily  Your physician recommends that you schedule a follow-up appointment in: ONE MONTH with Dr. Rennis Golden

## 2016-07-28 NOTE — Progress Notes (Signed)
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 In 2011, her EF was 45% to 50% by echocardiogram with medical therapy. Unfortunately, after her initial diagnosis, she developed a pulmonary embolus in 2003 and was on Coumadin for about 2 years, and now is on aspirin 325 mg daily. She also has diabetes, hypertension, and obesity. She recently she tripped in her house and developed a small stress fracture and sprain of her left ankle which she has recovered from. She denies any shortness of breath with exertion, chest pain, palpitations, presyncope, syncopal symptoms.  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 She continues to feel at baseline. Interestingly, her heart rate is lower than it was at her last office visit.  Laurie Small returns today for followup. She reports doing well denies any shortness of breath or chest pain with exertion. Unfortunately she's not been able to lose any significant amount of weight. She has had some adjustments in her diabetes medicine with the addition of glipizide, she is also taking Onglyza and metformin 1000 mg twice daily.  She is interested in possibly getting off of some of her medications.  Saw Laurie Small back in the office today. Overall she is doing well without any symptoms of heart failure. Blood sugar has been better controlled. Weight appears to be down just a few pounds. EKG looks normal today. Her last echocardiogram however did not show significant improvement in EF, therefore I recommended we remain on her current heart failure medications. Currently she has NYHA class I symptoms.  04/28/2016  Laurie Small returns  today for follow-up. In the past year she's done fairly well. She denies any chest pain or worsening shortness of breath. Unfortunately she's been struggling with left plantar fasciitis and she is in a walking boot today. She's actually lost 46 pounds since we last saw her. She continues to have class I heart failure symptoms. Interestingly she is tachycardic today. Initially this was thought to be due to her rushing in the office however her heart rate remained elevated even at the end of the visit. She reports compliance with her beta blocker which is actually a high dose. I don't know if this is related to worsening heart failure with sympathetic activation. She was supposed to have an echo a year ago but we did not obtain that and I like to reassess LV function since his been 2 years since her last study. Her last EF was 40-45%. Stress new heart failure treatments that are available today including Entresto and Corlanor - which may be of some benefit.  06/21/2016  Laurie Small was seen in follow-up today. A repeat echo still shows her LVEF to be around 40%. She remains persistently tachycardiac with HR in the upper 90's and low 100's. This appears to be sinus. Blood pressure runs in the 110-120 systolic range and there is little room to increase her carvedilol which is already at 25 mg BID. She was on digoxin, but it did not seem to affect her heartrate, so I discontinued it. She may be a candidate for Corlanor. A big question is whether she has persistent, inappropriate sinus tachycardia and whether this is contributing to her cardiomyopathy.  07/28/2016  Mrs.  Laurie Small returns today for follow-up. Her heart rate remains in the upper 90s. Her monitor showed heart rates between the 70s and 130s, however she is persistently in the low 90s and 100s. Blood pressure is a little higher today and she may tolerate an increase in her carvedilol. We discussed adding Corlanor, however she is not interested  this time due to cost issues.   PMHx:  Past Medical History:  Diagnosis Date  . Depression   . NICM (nonischemic cardiomyopathy) (HCC)    2D ECHO, 12/26/2011 - EF 45-50%, left ventricle borderline dilated    Past Surgical History:  Procedure Laterality Date  . CARDIAC CATHETERIZATION  01/06/2002   Dilated nonsichemic cardiopmyopathy, EF 28%, at least 2+ angiographic mitral regurgitation    FAMHx:  Family History  Problem Relation Age of Onset  . Hypertension Mother   . GI Bleed Mother   . Pneumonia Maternal Grandmother   . Cancer Paternal Grandmother     Breast cancer    SOCHx:   reports that she has never smoked. She does not have any smokeless tobacco history on file. She reports that she does not drink alcohol or use drugs.  ALLERGIES:  Allergies  Allergen Reactions  . Betadine [Povidone Iodine]   . Iodine   . Peanuts [Peanut Oil]   . Shellfish Allergy   . Other Hives and Rash    Sunlight and Grass     ROS: Pertinent items noted in HPI and remainder of comprehensive ROS otherwise negative.  HOME MEDS: Current Outpatient Prescriptions  Medication Sig Dispense Refill  . aspirin 325 MG tablet Take 325 mg by mouth daily.    Marland Kitchen atorvastatin (LIPITOR) 40 MG tablet Take 1 tablet (40 mg total) by mouth daily at 6 PM. 90 tablet 3  . carvedilol (COREG) 25 MG tablet Take 1.5 tablets (37.5 mg total) by mouth 2 (two) times daily with a meal. 270 tablet 1  . colchicine 0.6 MG tablet Take 1 tablet (0.6 mg total) by mouth daily. 30 tablet 1  . diphenhydrAMINE (BENADRYL) 25 MG tablet Take 25 mg by mouth daily.    Marland Kitchen FARXIGA 10 MG TABS tablet TAKE 1 TABLET BY MOUTH DAILY 90 tablet 0  . fluconazole (DIFLUCAN) 150 MG tablet     . glipiZIDE (GLUCOTROL XL) 10 MG 24 hr tablet TAKE 1 TABLET (10 MG TOTAL) BY MOUTH DAILY. TAKE 5 MG IN AM AND 10 MG IN PM (CRUSHED) 90 tablet 0  . hydrochlorothiazide (HYDRODIURIL) 25 MG tablet Take 1 tablet (25 mg total) by mouth daily. 90 tablet 3  .  hydrocortisone 2.5 % cream as needed.    . Insulin Pen Needle 32G X 4 MM MISC Use as directed. 30 each 2  . loratadine (CLARITIN) 10 MG tablet Take 10 mg by mouth daily.    . metFORMIN (GLUCOPHAGE) 1000 MG tablet Take 1 tablet by mouth 2 (two) times daily with a meal.   0  . ramipril (ALTACE) 5 MG capsule TAKE 2 CAPSULES BY MOUTH IN THE MORNING AND 1 CAPSULE BY MOUTH IN THE EVENING 270 capsule 0  . spironolactone (ALDACTONE) 25 MG tablet Take 0.5 tablets (12.5 mg total) by mouth daily. 45 tablet 3  . TANZEUM 50 MG PEN INJECT 50 MG INTO THE SKIN ONCE A WEEK. 12 each 1   No current facility-administered medications for this visit.     LABS/IMAGING: No results found for this or any previous visit (from the past 48 hour(s)). No results found.  VITALS: BP 134/78   Pulse 92   Ht 5\' 3"  (1.6 m)   Wt 205 lb 6.4 oz (93.2 kg)   BMI 36.38 kg/m   EXAM: Deferred  EKG: Normal sinus rhythm at 77, LBBB  ASSESSMENT: 1. Nonischemic cardiomyopathy, EF 40% (2017) - NYHA class I symptoms 2. History of pulmonary embolism 3. Diabetes type 2 - controlled 4. Hypertension - controlled 5. Obesity  PLAN: 1.    Ms. Small has persistent tachycardia and would benefit from an increase in her beta blocker. Will plan to increase her carvedilol to 37.5 mg twice daily. She is to monitor blood pressure and if it is running lower, we may need to decrease her ramipril.  Chrystie Nose, MD, [facility] Attending Cardiologist [facility]  Chrystie Nose 07/28/2016, 5:01 PM

## 2016-08-21 ENCOUNTER — Other Ambulatory Visit: Payer: Self-pay | Admitting: Internal Medicine

## 2016-08-22 ENCOUNTER — Other Ambulatory Visit: Payer: Self-pay

## 2016-08-22 MED ORDER — GLIPIZIDE ER 5 MG PO TB24: ORAL_TABLET | ORAL | 2 refills | Status: DC

## 2016-08-29 ENCOUNTER — Ambulatory Visit (INDEPENDENT_AMBULATORY_CARE_PROVIDER_SITE_OTHER): Payer: Managed Care, Other (non HMO) | Admitting: Internal Medicine

## 2016-08-29 ENCOUNTER — Encounter: Payer: Self-pay | Admitting: Internal Medicine

## 2016-08-29 VITALS — BP 140/86 | HR 90 | Ht 63.0 in | Wt 204.0 lb

## 2016-08-29 DIAGNOSIS — I428 Other cardiomyopathies: Secondary | ICD-10-CM

## 2016-08-29 DIAGNOSIS — R000 Tachycardia, unspecified: Secondary | ICD-10-CM

## 2016-08-29 DIAGNOSIS — I5022 Chronic systolic (congestive) heart failure: Secondary | ICD-10-CM | POA: Diagnosis not present

## 2016-08-29 MED ORDER — IVABRADINE HCL 5 MG PO TABS: 5.0000 mg | ORAL_TABLET | Freq: Two times a day (BID) | ORAL | 5 refills | Status: DC

## 2016-08-29 NOTE — Patient Instructions (Signed)
Your physician has recommended you make the following change in your medication:  -- START Corlanor 5mg  twice daily  Your physician recommends that you schedule a follow-up appointment in: ONE MONTH with Dr. Rennis Golden

## 2016-08-29 NOTE — Progress Notes (Signed)
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 In 2011, her EF was 45% to 50% by echocardiogram with medical therapy. Unfortunately, after her initial diagnosis, she developed a pulmonary embolus in 2003 and was on Coumadin for about 2 years, and now is on aspirin 325 mg daily. She also has diabetes, hypertension, and obesity. She recently she tripped in her house and developed a small stress fracture and sprain of her left ankle which she has recovered from. She denies any shortness of breath with exertion, chest pain, palpitations, presyncope, syncopal symptoms.  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 She continues to feel at baseline. Interestingly, her heart rate is lower than it was at her last office visit.  Laurie Small returns today for followup. She reports doing well denies any shortness of breath or chest pain with exertion. Unfortunately she's not been able to lose any significant amount of weight. She has had some adjustments in her diabetes medicine with the addition of glipizide, she is also taking Onglyza and metformin 1000 mg twice daily.  She is interested in possibly getting off of some of her medications.  Saw Laurie Small back in the office today. Overall she is doing well without any symptoms of heart failure. Blood sugar has been better controlled. Weight appears to be down just a few pounds. EKG looks normal today. Her last echocardiogram however did not show significant improvement in EF, therefore I recommended we remain on her current heart failure medications. Currently she has NYHA class I symptoms.  04/28/2016  Laurie Small returns  today for follow-up. In the past year she's done fairly well. She denies any chest pain or worsening shortness of breath. Unfortunately she's been struggling with left plantar fasciitis and she is in a walking boot today. She's actually lost 46 pounds since we last saw her. She continues to have class I heart failure symptoms. Interestingly she is tachycardic today. Initially this was thought to be due to her rushing in the office however her heart rate remained elevated even at the end of the visit. She reports compliance with her beta blocker which is actually a high dose. I don't know if this is related to worsening heart failure with sympathetic activation. She was supposed to have an echo a year ago but we did not obtain that and I like to reassess LV function since his been 2 years since her last study. Her last EF was 40-45%. Stress new heart failure treatments that are available today including Entresto and Corlanor - which may be of some benefit.  06/21/2016  Laurie Small was seen in follow-up today. A repeat echo still shows her LVEF to be around 40%. She remains persistently tachycardiac with HR in the upper 90's and low 100's. This appears to be sinus. Blood pressure runs in the 110-120 systolic range and there is little room to increase her carvedilol which is already at 25 mg BID. She was on digoxin, but it did not seem to affect her heartrate, so I discontinued it. She may be a candidate for Corlanor. A big question is whether she has persistent, inappropriate sinus tachycardia and whether this is contributing to her cardiomyopathy.  07/28/2016  Laurie Small  Laurie Small returns today for follow-up. Her heart rate remains in the upper 90s. Her monitor showed heart rates between the 70s and 130s, however she is persistently in the low 90s and 100s. Blood pressure is a little higher today and she may tolerate an increase in her carvedilol. We discussed adding Corlanor, however she is not interested  this time due to cost issues.  08/29/2016  [name] returns for follow-up. I increased her carvedilol and blood pressure now is more in the 110-120 systolic range. Heart rate remains elevated in the 80s and 90s. This is actually on except behind given her low ejection fraction of 40%.  I discussed this with Dr. Gala Romney and although EF is not less than 35%, there is probably good reason to consider trying it for HR improvement.  PMHx:  Past Medical History:  Diagnosis Date  . Depression   . NICM (nonischemic cardiomyopathy) (HCC)    2D ECHO, 12/26/2011 - EF 45-50%, left ventricle borderline dilated    Past Surgical History:  Procedure Laterality Date  . CARDIAC CATHETERIZATION  01/06/2002   Dilated nonsichemic cardiopmyopathy, EF 28%, at least 2+ angiographic mitral regurgitation    FAMHx:  Family History  Problem Relation Age of Onset  . Hypertension Mother   . GI Bleed Mother   . Pneumonia Maternal Grandmother   . Cancer Paternal Grandmother     Breast cancer    SOCHx:   reports that she has never smoked. She has never used smokeless tobacco. She reports that she does not drink alcohol or use drugs.  ALLERGIES:  Allergies  Allergen Reactions  . Betadine [Povidone Iodine]   . Iodine   . Peanuts [Peanut Oil]   . Shellfish Allergy   . Other Hives and Rash    Sunlight and Grass     ROS: Pertinent items noted in HPI and remainder of comprehensive ROS otherwise negative.  HOME MEDS: Current Outpatient Prescriptions  Medication Sig Dispense Refill  . allopurinol (ZYLOPRIM) 100 MG tablet Take 1 tablet by mouth daily.    Marland Kitchen aspirin 325 MG tablet Take 325 mg by mouth daily.    Marland Kitchen atorvastatin (LIPITOR) 40 MG tablet Take 1 tablet (40 mg total) by mouth daily at 6 PM. 90 tablet 3  . carvedilol (COREG) 25 MG tablet Take 1.5 tablets (37.5 mg total) by mouth 2 (two) times daily with a meal. 270 tablet 1  . colchicine 0.6 MG tablet Take 1 tablet (0.6 mg total) by mouth  daily. 30 tablet 1  . diphenhydrAMINE (BENADRYL) 25 MG tablet Take 25 mg by mouth daily.    Marland Kitchen FARXIGA 10 MG TABS tablet TAKE 1 TABLET BY MOUTH DAILY 90 tablet 0  . fluconazole (DIFLUCAN) 150 MG tablet     . glipiZIDE (GLUCOTROL XL) 5 MG 24 hr tablet Take 1 tab in the morning and half tab in the evening    . hydrochlorothiazide (HYDRODIURIL) 25 MG tablet Take 1 tablet (25 mg total) by mouth daily. 90 tablet 3  . hydrocortisone 2.5 % cream as needed.    . Insulin Pen Needle 32G X 4 MM MISC Use as directed. 30 each 2  . loratadine (CLARITIN) 10 MG tablet Take 10 mg by mouth daily.    . metFORMIN (GLUCOPHAGE) 1000 MG tablet Take 1 tablet by mouth 2 (two) times daily with a meal.   0  . predniSONE (DELTASONE) 5 MG tablet Take 5 mg by mouth as needed.    . ramipril (ALTACE) 5  MG capsule TAKE 2 CAPSULES BY MOUTH IN THE MORNING AND 1 CAPSULE BY MOUTH IN THE EVENING 270 capsule 0  . spironolactone (ALDACTONE) 25 MG tablet Take 0.5 tablets (12.5 mg total) by mouth daily. 45 tablet 3  . TANZEUM 50 MG PEN INJECT 50 MG INTO THE SKIN ONCE A WEEK. 12 each 1  . ivabradine (CORLANOR) 5 MG TABS tablet Take 1 tablet (5 mg total) by mouth 2 (two) times daily with a meal. 60 tablet 5   No current facility-administered medications for this visit.     LABS/IMAGING: No results found for this or any previous visit (from the past 48 hour(s)). No results found.  VITALS: BP 140/86   Pulse 90   Ht 5\' 3"  (1.6 m)   Wt 204 lb (92.5 kg)   BMI 36.14 kg/m   EXAM: Deferred  EKG: Normal sinus rhythm at 90, LBBB  ASSESSMENT: 1. Nonischemic cardiomyopathy, EF 40% (2017) - NYHA class I symptoms 2. History of pulmonary embolism 3. Diabetes type 2 - controlled 4. Hypertension - controlled 5. Obesity  PLAN: 1.    Ms. House has persistent tachycardia, despite an increase in her beta blocker. Blood pressure will not allow further increase in beta blocker. Her LVEF has been as low as 28% in the past. I  discussed the scenario with Dr. Sampson Goon are advanced heart failure cardiologist, and he feels that she could benefit from the addition of Corlanor (despite EF now slightly higher than the 35% cutoff in the study). Will start at 5 mg BID in addition to her current regimen. Follow-up with me in 1 month.  Chrystie Nose, MD, [facility] Attending Cardiologist [facility]  Chrystie Nose 08/29/2016, 2:57 PM

## 2016-09-04 NOTE — Addendum Note (Signed)
Addended by: Kandice Robinsons T on: 09/04/2016 02:52 PM   Modules accepted: Orders

## 2016-09-13 ENCOUNTER — Other Ambulatory Visit: Payer: Self-pay | Admitting: Internal Medicine

## 2016-09-14 ENCOUNTER — Ambulatory Visit (INDEPENDENT_AMBULATORY_CARE_PROVIDER_SITE_OTHER): Payer: Managed Care, Other (non HMO) | Admitting: Internal Medicine

## 2016-09-14 VITALS — BP 124/84 | HR 82 | Ht 63.0 in | Wt 206.0 lb

## 2016-09-14 DIAGNOSIS — R809 Proteinuria, unspecified: Secondary | ICD-10-CM | POA: Diagnosis not present

## 2016-09-14 DIAGNOSIS — E1129 Type 2 diabetes mellitus with other diabetic kidney complication: Secondary | ICD-10-CM | POA: Diagnosis not present

## 2016-09-14 LAB — POCT GLYCOSYLATED HEMOGLOBIN (HGB A1C)

## 2016-09-14 MED ORDER — GLIPIZIDE ER 5 MG PO TB24: 10.0000 mg | ORAL_TABLET | Freq: Every day | ORAL | 3 refills | Status: DC

## 2016-09-14 MED ORDER — DAPAGLIFLOZIN PROPANEDIOL 10 MG PO TABS: 10.0000 mg | ORAL_TABLET | Freq: Every day | ORAL | 3 refills | Status: DC

## 2016-09-14 NOTE — Progress Notes (Signed)
Patient ID: Sharhonda Atwood, female   DOB: 1976/03/22, 41 y.o.   MRN: [id number]  HPI: Thea Holshouser is a 41 y.o.-year-old female, returning for f/u for DM2, dx in 2003 (after an episode of myocarditis), non-insulin-dependent, uncontrolled, with complications (microalbuminuria). Last visit 3 mo ago.  She has gout >> on Allopurinol and Colchicine.  Last hemoglobin A1c was: Lab Results  Component Value Date   HGBA1C 8.2 06/13/2016   HGBA1C 9.2 03/13/2016   HGBA1C 8.8 12/21/2015  05/31/2015: HbA1c 10% 09/15/2014: HbA1c 9.6% 04/23/2014: HbA1c 10.9% 01/20/2014: HbA1c 12.9%  Pt is on a regimen of: - Metformin 1000 mg 2x a day, with meals - Glipizide ER XL 10 mg in am and 2.5 mg before dinner - Farxiga 10 mg in am - Trulicity 1.5 mg weekly >> Tanzeum 50 mg weekly - changed 10/2015 2/2 insurance coverage - she does not like the Tanzeum as much She was on Onglyza (too expensive).  She was on JanuMet (stopped being effective), then Januvia >> stopped when starting Trulicity She was on Victoza (trial period). She refused insulin.  Pt checks her sugars 1x a day and they are: - am: 135-211 (200s is she ate sweets the night before) >> 125-166, 180, 276 >> 62, 72-123, 144 >> 65, 68-140 - 2h after b'fast: n/c >> 198-292 >> n/c - before lunch: n/c >> 178, 187 >> 176 >> n/c - 2h after lunch: n/c >> n/c >> 136 >> n/c >> 165 >> n/c - before dinner: n/c >> 136-205 >> n/c  - 2h after dinner: n/c >> 142, 224 >> n/c >> 146, 180-225, 334 (sweet tea) >> 200, 213, 217 >> 159 - bedtime: n/c >> 62-134 >> 84-129, 268 - nighttime: n/c >> 85-142, 195 >> 73, 75, 214 No lows. Lowest sugar was 135 >> 62; ? hypoglycemia awareness.  Highest sugar was 334 >> 217.  Glucometer: FreeStyle Ultra mini >> Relion  Pt's meals are: - Breakfast: fruit, yoghurt, oatmeal, eggs, grits, bacon - Lunch: pasta, sandwich, salad - Dinner: meat + veggies - Snacks: 3 She works from home. She walks for exercise -  laps  - no CKD, last BUN/creatinine:  Lab Results  Component Value Date   BUN 22 04/28/2016   CREATININE 1.35 (H) 04/28/2016  05/31/2015: 13/0.91 ACR  (09/15/2014): 44.4 ACR (01/20/2014): 79.9 On Ramipril. - last set of lipids (monitored by cardiology): 03/31/2016: 136/69/49/73 06/12/2014: 147/118/44/80 Lab Results  Component Value Date   CHOL 158 01/06/2013   HDL 67 01/06/2013   LDLCALC 66 01/06/2013   TRIG 123 01/06/2013   CHOLHDL 2.4 01/06/2013  On Lipitor. - last eye exam was in 06/2016. No DR.  - no numbness and tingling in her feet. Heel spurs.  She also has a history of hypertension, hyperlipidemia, history of pulmonary embolism. She has had parathyroid surgery in 2001.  She has CHF, believed postviral. She is off Digoxin, now on Corlanor. She got a foot steroid injection.  ROS: Constitutional: no weight gain/loss, no fatigue Eyes: no blurry vision, no xerophthalmia ENT: no sore throat, no nodules palpated in throat, no dysphagia/odynophagia, no hoarseness Cardiovascular: no CP/SOB/palpitations/leg swelling Respiratory: no cough/SOB Gastrointestinal: no N/V/D/C/heartburn Musculoskeletal: no muscle/joint aches Skin: no Rash Neurological: no tremors/numbness/tingling/dizziness  I reviewed pt's medications, allergies, PMH, social hx, family hx, and changes were documented in the history of present illness. Otherwise, unchanged from my initial visit note.  Past Medical History:  Diagnosis Date  . Depression   . NICM (nonischemic cardiomyopathy) (HCC)    2D  ECHO, 12/26/2011 - EF 45-50%, left ventricle borderline dilated   Past Surgical History:  Procedure Laterality Date  . CARDIAC CATHETERIZATION  01/06/2002   Dilated nonsichemic cardiopmyopathy, EF 28%, at least 2+ angiographic mitral regurgitation   Social History   Social History  . Marital Status: Married    Spouse Name: N/A  . Number of Children: 1   Occupational History  .  claims benefits specialist     Social History Main Topics  . Smoking status: Never Smoker   . Smokeless tobacco: Not on file  . Alcohol Use: No  . Drug Use: No   Current Outpatient Prescriptions on File Prior to Visit  Medication Sig Dispense Refill  . allopurinol (ZYLOPRIM) 100 MG tablet Take 1 tablet by mouth daily.    Marland Kitchen aspirin 325 MG tablet Take 325 mg by mouth daily.    Marland Kitchen atorvastatin (LIPITOR) 40 MG tablet Take 1 tablet (40 mg total) by mouth daily at 6 PM. 90 tablet 3  . carvedilol (COREG) 25 MG tablet Take 1.5 tablets (37.5 mg total) by mouth 2 (two) times daily with a meal. 270 tablet 1  . colchicine 0.6 MG tablet Take 1 tablet (0.6 mg total) by mouth daily. 30 tablet 1  . diphenhydrAMINE (BENADRYL) 25 MG tablet Take 25 mg by mouth daily.    Marland Kitchen FARXIGA 10 MG TABS tablet TAKE 1 TABLET BY MOUTH DAILY 90 tablet 0  . glipiZIDE (GLUCOTROL XL) 5 MG 24 hr tablet Take 1 tab in the morning and half tab in the evening    . hydrochlorothiazide (HYDRODIURIL) 25 MG tablet Take 1 tablet (25 mg total) by mouth daily. 90 tablet 3  . hydrocortisone 2.5 % cream as needed.    . ivabradine (CORLANOR) 5 MG TABS tablet Take 1 tablet (5 mg total) by mouth 2 (two) times daily with a meal. 60 tablet 5  . metFORMIN (GLUCOPHAGE) 1000 MG tablet Take 1 tablet by mouth 2 (two) times daily with a meal.   0  . predniSONE (DELTASONE) 5 MG tablet Take 5 mg by mouth as needed.    . ramipril (ALTACE) 5 MG capsule TAKE 2 CAPSULES BY MOUTH IN THE MORNING AND 1 CAPSULE BY MOUTH IN THE EVENING 270 capsule 0  . spironolactone (ALDACTONE) 25 MG tablet Take 0.5 tablets (12.5 mg total) by mouth daily. 45 tablet 3  . TANZEUM 50 MG PEN INJECT 50 MG INTO THE SKIN ONCE A WEEK. 12 each 1   No current facility-administered medications on file prior to visit.    Allergies  Allergen Reactions  . Betadine [Povidone Iodine]   . Iodine   . Peanuts [Peanut Oil]   . Shellfish Allergy   . Other Hives and Rash    Sunlight and Grass    Family History   Problem Relation Age of Onset  . Hypertension Mother   . GI Bleed Mother   . Pneumonia Maternal Grandmother   . Cancer Paternal Grandmother     Breast cancer   PE: BP 124/84 (BP Location: Left Arm, Patient Position: Sitting)   Pulse 82   Ht 5\' 3"  (1.6 m)   Wt 206 lb (93.4 kg)   SpO2 97%   BMI 36.49 kg/m  Body mass index is 36.49 kg/m. Wt Readings from Last 3 Encounters:  09/14/16 206 lb (93.4 kg)  08/29/16 204 lb (92.5 kg)  07/28/16 205 lb 6.4 oz (93.2 kg)   Constitutional: overweight, in NAD Eyes: PERRLA, EOMI, no exophthalmos ENT: moist  mucous membranes, no thyromegaly, no cervical lymphadenopathy Cardiovascular: RR, No MRG Respiratory: CTA B Gastrointestinal: abdomen soft, NT, ND, BS+ Musculoskeletal: no deformities, strength intact in all 4 Skin: moist, warm, no rashes, + acanthosis nigricans on neck Neurological: no tremor with outstretched hands, DTR normal in all 4  ASSESSMENT: 1. DM2, non-insulin-dependent, uncontrolled, with complications - MAU  Cardiologist: Dr Rennis Golden  PLAN:  1. Patient with long-standing, uncontrolled diabetes, with worse control this summer, but better at last visit after also improving her diet. At this visit, sugars are quite low, even in the 60s most of the time, but has hyperglycemic spikes. Today HbA1c 9.4% (worse) >> this is not correlating with her sugar log >> will check a fructosamine level.  I will not change her regimen for now but may need to do so at next visit. - I suggested to:  Patient Instructions  Please continue: - Metformin 1000 mg 2x a day, with meals - Farxiga 10 mg in am - Tanzeum 50 mg weekly - Glipizide XL 10 mg before b'fast + 2.5 mg before dinner  Please return in 3 months with your sugar log.   - continue checking sugars at different times of the day - check 1-2 times a day, rotating checks - advised for yearly eye exams >> She is UTD - Return to clinic in 3 mo with sugar log   Office Visit on 09/14/2016   Component Date Value Ref Range Status  . Fructosamine 09/14/2016 305* 190 - 270 umol/L Final  . Hemoglobin A1C 09/14/2016 9.72f   Final   HbA1c calculated from the fructosamine is MUCH lower, at 6.8%!  Carlus Pavlov, MD PhD [facility]

## 2016-09-14 NOTE — Patient Instructions (Addendum)
Please continue: - Metformin 1000 mg 2x a day, with meals - Farxiga 10 mg in am - Tanzeum 50 mg weekly - Glipizide XL 10 mg before b'fast + 2.5 mg before dinner  Please return in 3 months with your sugar log.   Please stop at the lab.

## 2016-09-18 LAB — FRUCTOSAMINE: Fructosamine: 305 umol/L — ABNORMAL HIGH (ref 190–270)

## 2016-09-19 ENCOUNTER — Encounter: Payer: Self-pay | Admitting: Internal Medicine

## 2016-09-28 ENCOUNTER — Encounter: Payer: Self-pay | Admitting: Internal Medicine

## 2016-09-28 ENCOUNTER — Ambulatory Visit (INDEPENDENT_AMBULATORY_CARE_PROVIDER_SITE_OTHER): Payer: Managed Care, Other (non HMO) | Admitting: Internal Medicine

## 2016-09-28 VITALS — BP 136/82 | HR 85 | Ht 63.0 in | Wt 205.6 lb

## 2016-09-28 DIAGNOSIS — I10 Essential (primary) hypertension: Secondary | ICD-10-CM

## 2016-09-28 DIAGNOSIS — E669 Obesity, unspecified: Secondary | ICD-10-CM | POA: Diagnosis not present

## 2016-09-28 DIAGNOSIS — I428 Other cardiomyopathies: Secondary | ICD-10-CM | POA: Diagnosis not present

## 2016-09-28 DIAGNOSIS — R000 Tachycardia, unspecified: Secondary | ICD-10-CM | POA: Diagnosis not present

## 2016-09-28 NOTE — Progress Notes (Signed)
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 In 2011, her EF was 45% to 50% by echocardiogram with medical therapy. Unfortunately, after her initial diagnosis, she developed a pulmonary embolus in 2003 and was on Coumadin for about 2 years, and now is on aspirin 325 mg daily. She also has diabetes, hypertension, and obesity. She recently she tripped in her house and developed a small stress fracture and sprain of her left ankle which she has recovered from. She denies any shortness of breath with exertion, chest pain, palpitations, presyncope, syncopal symptoms.  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 She continues to feel at baseline. Interestingly, her heart rate is lower than it was at her last office visit.  Laurie Small returns today for followup. She reports doing well denies any shortness of breath or chest pain with exertion. Unfortunately she's not been able to lose any significant amount of weight. She has had some adjustments in her diabetes medicine with the addition of glipizide, she is also taking Onglyza and metformin 1000 mg twice daily.  She is interested in possibly getting off of some of her medications.  Saw Ms. Small back in the office today. Overall she is doing well without any symptoms of heart failure. Blood sugar has been better controlled. Weight appears to be down just a few pounds. EKG looks normal today. Her last echocardiogram however did not show significant improvement in EF, therefore I recommended we remain on her current heart failure medications. Currently she has NYHA class I symptoms.  04/28/2016  Laurie Small returns  today for follow-up. In the past year she's done fairly well. She denies any chest pain or worsening shortness of breath. Unfortunately she's been struggling with left plantar fasciitis and she is in a walking boot today. She's actually lost 46 pounds since we last saw her. She continues to have class I heart failure symptoms. Interestingly she is tachycardic today. Initially this was thought to be due to her rushing in the office however her heart rate remained elevated even at the end of the visit. She reports compliance with her beta blocker which is actually a high dose. I don't know if this is related to worsening heart failure with sympathetic activation. She was supposed to have an echo a year ago but we did not obtain that and I like to reassess LV function since his been 2 years since her last study. Her last EF was 40-45%. Stress new heart failure treatments that are available today including Entresto and Corlanor - which may be of some benefit.  06/21/2016  Ms. Small was seen in follow-up today. A repeat echo still shows her LVEF to be around 40%. She remains persistently tachycardiac with HR in the upper 90's and low 100's. This appears to be sinus. Blood pressure runs in the 110-120 systolic range and there is little room to increase her carvedilol which is already at 25 mg BID. She was on digoxin, but it did not seem to affect her heartrate, so I discontinued it. She may be a candidate for Corlanor. A big question is whether she has persistent, inappropriate sinus tachycardia and whether this is contributing to her cardiomyopathy.  07/28/2016  Laurie Small  Laurie Small returns today for follow-up. Her heart rate remains in the upper 90s. Her monitor showed heart rates between the 70s and 130s, however she is persistently in the low 90s and 100s. Blood pressure is a little higher today and she may tolerate an increase in her carvedilol. We discussed adding Corlanor, however she is not interested  this time due to cost issues.  08/29/2016  [name] returns for follow-up. I increased her carvedilol and blood pressure now is more in the 110-120 systolic range. Heart rate remains elevated in the 80s and 90s. This is actually on except behind given her low ejection fraction of 40%.  I discussed this with Dr. Gala Romney and although EF is not less than 35%, there is probably good reason to consider trying it for HR improvement.  09/28/2016  Laurie Small returns today for follow-up. Interestingly her heart rate has improved in general on Ivabradine. It appears from her home measurements that her heart rate is somewhere around 70. She is on the 5 mg twice a day dosing. The only side effect she notices are the visual disturbances which seems to be worse in the morning but gets better fairly quickly.  PMHx:  Past Medical History:  Diagnosis Date  . Depression   . NICM (nonischemic cardiomyopathy) (HCC)    2D ECHO, 12/26/2011 - EF 45-50%, left ventricle borderline dilated    Past Surgical History:  Procedure Laterality Date  . CARDIAC CATHETERIZATION  01/06/2002   Dilated nonsichemic cardiopmyopathy, EF 28%, at least 2+ angiographic mitral regurgitation    FAMHx:  Family History  Problem Relation Age of Onset  . Hypertension Mother   . GI Bleed Mother   . Pneumonia Maternal Grandmother   . Cancer Paternal Grandmother     Breast cancer    SOCHx:   reports that she has never smoked. She has never used smokeless tobacco. She reports that she does not drink alcohol or use drugs.  ALLERGIES:  Allergies  Allergen Reactions  . Betadine [Povidone Iodine]   . Iodine   . Peanuts [Peanut Oil]   . Shellfish Allergy   . Other Hives and Rash    Sunlight and Grass     ROS: Pertinent items noted in HPI and remainder of comprehensive ROS otherwise negative.  HOME MEDS: Current Outpatient Prescriptions  Medication Sig Dispense Refill  . allopurinol (ZYLOPRIM) 100 MG tablet Take  1 tablet by mouth daily.    Marland Kitchen aspirin 325 MG tablet Take 325 mg by mouth daily.    Marland Kitchen atorvastatin (LIPITOR) 40 MG tablet Take 1 tablet (40 mg total) by mouth daily at 6 PM. 90 tablet 3  . carvedilol (COREG) 25 MG tablet Take 1.5 tablets (37.5 mg total) by mouth 2 (two) times daily with a meal. 270 tablet 1  . colchicine 0.6 MG tablet Take 1 tablet (0.6 mg total) by mouth daily. 30 tablet 1  . dapagliflozin propanediol (FARXIGA) 10 MG TABS tablet Take 10 mg by mouth daily. 90 tablet 3  . diphenhydrAMINE (BENADRYL) 25 MG tablet Take 25 mg by mouth daily.    Marland Kitchen glipiZIDE (GLUCOTROL XL) 5 MG 24 hr tablet Take 2 tablets (10 mg total) by mouth daily with breakfast. Take half tab in the evening 90 tablet 3  . hydrochlorothiazide (HYDRODIURIL) 25 MG tablet Take 1 tablet (25 mg total) by mouth daily. 90 tablet 3  . hydrocortisone 2.5 % cream as needed.    . ivabradine (CORLANOR) 5 MG TABS tablet Take  1 tablet (5 mg total) by mouth 2 (two) times daily with a meal. 60 tablet 5  . metFORMIN (GLUCOPHAGE) 1000 MG tablet Take 1 tablet by mouth 2 (two) times daily with a meal.   0  . predniSONE (DELTASONE) 5 MG tablet Take 5 mg by mouth as needed.    . ramipril (ALTACE) 5 MG capsule TAKE 2 CAPSULES BY MOUTH IN THE MORNING AND 1 CAPSULE BY MOUTH IN THE EVENING 270 capsule 0  . spironolactone (ALDACTONE) 25 MG tablet Take 0.5 tablets (12.5 mg total) by mouth daily. 45 tablet 3  . TANZEUM 50 MG PEN INJECT 50 MG INTO THE SKIN ONCE A WEEK. 12 each 1   No current facility-administered medications for this visit.     LABS/IMAGING: No results found for this or any previous visit (from the past 48 hour(s)). No results found.  VITALS: BP 136/82   Pulse 85   Ht 5\' 3"  (1.6 m)   Wt 205 lb 9.6 oz (93.3 kg)   SpO2 98%   BMI 36.42 kg/m   EXAM: Deferred  EKG: Deferred  ASSESSMENT: 1. Nonischemic cardiomyopathy, EF 40% (2017) - NYHA class I symptoms 2. History of pulmonary embolism 3. Diabetes type 2 -  controlled 4. Hypertension - controlled 5. Obesity  PLAN: 1.    Ms. Betley seems to have had a heart rate improvement on Ivabradine. She does have some mild visual disturbance which is a known side effect of the medication. She does not seem to think that that is affecting her quality of life to any great extent. I've encouraged her to stay on the medication as I do believe this is helped with her tachycardia. Will likely need to reassess LV function in 6-12 months with echo but continue with her traditional heart failure treatment.  Chrystie Nose, MD, [facility] Attending Cardiologist [facility]  Chrystie Nose 09/28/2016, 5:26 PM

## 2016-09-28 NOTE — Patient Instructions (Signed)
Your physician wants you to follow-up in: 6 months with Dr. Hilty. You will receive a reminder letter in the mail two months in advance. If you don't receive a letter, please call our office to schedule the follow-up appointment.    

## 2016-11-18 ENCOUNTER — Other Ambulatory Visit: Payer: Self-pay | Admitting: Internal Medicine

## 2016-12-11 ENCOUNTER — Other Ambulatory Visit: Payer: Self-pay | Admitting: Internal Medicine

## 2016-12-13 ENCOUNTER — Other Ambulatory Visit: Payer: Self-pay | Admitting: Internal Medicine

## 2016-12-14 ENCOUNTER — Ambulatory Visit (INDEPENDENT_AMBULATORY_CARE_PROVIDER_SITE_OTHER): Payer: Managed Care, Other (non HMO) | Admitting: Internal Medicine

## 2016-12-14 ENCOUNTER — Encounter: Payer: Self-pay | Admitting: Internal Medicine

## 2016-12-14 VITALS — BP 118/62 | HR 96 | Ht 64.0 in | Wt 204.0 lb

## 2016-12-14 DIAGNOSIS — R809 Proteinuria, unspecified: Secondary | ICD-10-CM | POA: Diagnosis not present

## 2016-12-14 DIAGNOSIS — E1129 Type 2 diabetes mellitus with other diabetic kidney complication: Secondary | ICD-10-CM | POA: Diagnosis not present

## 2016-12-14 MED ORDER — DULAGLUTIDE 1.5 MG/0.5ML ~~LOC~~ SOAJ: SUBCUTANEOUS | 11 refills | Status: DC

## 2016-12-14 NOTE — Progress Notes (Signed)
Patient ID: Shoshanna Mcquitty, female   DOB: Sep 24, 1975, 41 y.o.   MRN: [id number]  HPI: Parris Signer is a 41 y.o.-year-old female, returning for f/u for DM2, dx in 2003 (after an episode of myocarditis), non-insulin-dependent, uncontrolled, with complications (microalbuminuria). Last visit 3 mo ago.  Last hemoglobin A1c was: 09/14/2016: HbA1c calculated from the fructosamine is MUCH lower, at 6.8%! Lab Results  Component Value Date   HGBA1C 9.71f 09/14/2016   HGBA1C 8.2 06/13/2016   HGBA1C 9.2 03/13/2016  05/31/2015: HbA1c 10% 09/15/2014: HbA1c 9.6% 04/23/2014: HbA1c 10.9% 01/20/2014: HbA1c 12.9%  Pt is on a regimen of: - Metformin 1000 mg 2x a day, with meals - Glipizide ER XL 10 mg in am and 2.5 mg before dinner - Farxiga 10 mg in am - Trulicity 1.5 mg weekly >> Tanzeum 50 mg weekly - changed 10/2015 2/2 insurance coverage - she does not like the Tanzeum as much She was on Onglyza (too expensive).  She was on JanuMet (stopped being effective), then Januvia >> stopped when starting Trulicity She was on Victoza (trial period). She refused insulin.  Pt checks her sugars 1x a day and they are: - am: 125-166, 180, 276 >> 62, 72-123, 144 >> 65, 68-140 >> 47 x1, 56 x1, 71-163, 173, 217, 229 - 2h after b'fast: n/c >> 198-292 >> n/c >> 115-205, 267 - before lunch: n/c >> 178, 187 >> 176 >> n/c >> 126 - 2h after lunch: n/c >> n/c >> 136 >> n/c >> 165 >> n/c - before dinner: n/c >> 136-205 >> n/c  - 2h after dinner: 146, 180-225, 334 (sweet tea) >> 200, 213, 217 >> 159 >> n/c - bedtime: n/c >> 62-134 >> 84-129, 268 >> 67, 95, 230 - nighttime: n/c >> 85-142, 195 >> 73, 75, 214 No lows. Lowest sugar was 135 >> 62; ? hypoglycemia awareness.  Highest sugar was 334 >> 217.  Glucometer: FreeStyle Ultra mini >> Relion  Pt's meals are: - Breakfast: fruit, yoghurt, oatmeal, eggs, grits, bacon - Lunch: pasta, sandwich, salad - Dinner: meat + veggies - Snacks: 3 She works from  home. She walks for exercise  - no CKD, last BUN/creatinine:  Lab Results  Component Value Date   BUN 22 04/28/2016   CREATININE 1.35 (H) 04/28/2016  05/31/2015: 13/0.91 ACR  (09/15/2014): 44.4 ACR (01/20/2014): 79.9 On Ramipril. - last set of lipids (monitored by cardiology): 03/31/2016: 136/69/49/73 06/12/2014: 147/118/44/80 Lab Results  Component Value Date   CHOL 158 01/06/2013   HDL 67 01/06/2013   LDLCALC 66 01/06/2013   TRIG 123 01/06/2013   CHOLHDL 2.4 01/06/2013  On Lipitor. - last eye exam was in 06/2016 >> No DR.  - denies numbness and tingling in her feet. Heel spurs.  She also has a history of hypertension, hyperlipidemia, history of pulmonary embolism. She has had parathyroid surgery in 2001.  She has CHF, believed postviral. She is off Digoxin, now on Corlanor, but this is now very expensive >> did not get it.   ROS: Constitutional: no weight gain/no weight loss, no fatigue, no subjective hyperthermia, no subjective hypothermia Eyes: no blurry vision, no xerophthalmia ENT: no sore throat, no nodules palpated in throat, no dysphagia, no odynophagia, no hoarseness Cardiovascular: no CP/no SOB/no palpitations/no leg swelling Respiratory: no cough/no SOB/no wheezing Gastrointestinal: no N/no V/no D/no C/no acid reflux Musculoskeletal: no muscle aches/no joint aches Skin: no rashes, no hair loss Neurological: no tremors/no numbness/no tingling/no dizziness  I reviewed pt's medications, allergies, PMH, social hx, family hx,  and changes were documented in the history of present illness. Otherwise, unchanged from my initial visit note.   Past Medical History:  Diagnosis Date  . Depression   . NICM (nonischemic cardiomyopathy) (HCC)    2D ECHO, 12/26/2011 - EF 45-50%, left ventricle borderline dilated   Past Surgical History:  Procedure Laterality Date  . CARDIAC CATHETERIZATION  01/06/2002   Dilated nonsichemic cardiopmyopathy, EF 28%, at least 2+ angiographic  mitral regurgitation   Social History   Social History  . Marital Status: Married    Spouse Name: N/A  . Number of Children: 1   Occupational History  .  claims benefits specialist    Social History Main Topics  . Smoking status: Never Smoker   . Smokeless tobacco: Not on file  . Alcohol Use: No  . Drug Use: No   Current Outpatient Prescriptions on File Prior to Visit  Medication Sig Dispense Refill  . allopurinol (ZYLOPRIM) 100 MG tablet Take 1 tablet by mouth daily.    Marland Kitchen aspirin 325 MG tablet Take 325 mg by mouth daily.    Marland Kitchen atorvastatin (LIPITOR) 40 MG tablet Take 1 tablet (40 mg total) by mouth daily at 6 PM. 90 tablet 3  . carvedilol (COREG) 25 MG tablet Take 1.5 tablets (37.5 mg total) by mouth 2 (two) times daily with a meal. 270 tablet 1  . colchicine 0.6 MG tablet Take 1 tablet (0.6 mg total) by mouth daily. 30 tablet 1  . dapagliflozin propanediol (FARXIGA) 10 MG TABS tablet Take 10 mg by mouth daily. 90 tablet 3  . diphenhydrAMINE (BENADRYL) 25 MG tablet Take 25 mg by mouth daily.    Marland Kitchen FARXIGA 10 MG TABS tablet TAKE 1 TABLET BY MOUTH DAILY 90 tablet 0  . glipiZIDE (GLUCOTROL XL) 5 MG 24 hr tablet Take 2 tablets (10 mg total) by mouth daily with breakfast. Take half tab in the evening 90 tablet 3  . glipiZIDE (GLUCOTROL XL) 5 MG 24 hr tablet TAKE TWO TABLETS BEFORE BREAKFAST, AND HALF A TABLET BEFORE DINNER. 90 tablet 1  . hydrochlorothiazide (HYDRODIURIL) 25 MG tablet Take 1 tablet (25 mg total) by mouth daily. 90 tablet 3  . hydrocortisone 2.5 % cream as needed.    . ivabradine (CORLANOR) 5 MG TABS tablet Take 1 tablet (5 mg total) by mouth 2 (two) times daily with a meal. 60 tablet 5  . metFORMIN (GLUCOPHAGE) 1000 MG tablet Take 1 tablet by mouth 2 (two) times daily with a meal.   0  . predniSONE (DELTASONE) 5 MG tablet Take 5 mg by mouth as needed.    . ramipril (ALTACE) 5 MG capsule TAKE 2 CAPSULES BY MOUTH IN THE MORNING AND 1 CAPSULE BY MOUTH IN THE EVENING 270  capsule 0  . spironolactone (ALDACTONE) 25 MG tablet Take 0.5 tablets (12.5 mg total) by mouth daily. 45 tablet 3  . TANZEUM 50 MG PEN INJECT 50 MG INTO THE SKIN ONCE A WEEK. 12 each 1   No current facility-administered medications on file prior to visit.    Allergies  Allergen Reactions  . Betadine [Povidone Iodine]   . Iodine   . Peanuts [Peanut Oil]   . Shellfish Allergy   . Other Hives and Rash    Sunlight and Grass    Family History  Problem Relation Age of Onset  . Hypertension Mother   . GI Bleed Mother   . Pneumonia Maternal Grandmother   . Cancer Paternal Grandmother  Breast cancer   PE: BP 118/62 (BP Location: Left Arm, Patient Position: Sitting)   Pulse 96   Ht 5\' 4"  (1.626 m)   Wt 204 lb (92.5 kg)   LMP 11/14/2016   SpO2 97%   BMI 35.02 kg/m  Body mass index is 35.02 kg/m. Wt Readings from Last 3 Encounters:  12/14/16 204 lb (92.5 kg)  09/28/16 205 lb 9.6 oz (93.3 kg)  09/14/16 206 lb (93.4 kg)   Constitutional: overweight, in NAD Eyes: PERRLA, EOMI, no exophthalmos ENT: moist mucous membranes, no thyromegaly, no cervical lymphadenopathy Cardiovascular: RRR, No MRG Respiratory: CTA B Gastrointestinal: abdomen soft, NT, ND, BS+ Musculoskeletal: no deformities, strength intact in all 4 Skin: moist, warm, no rashes Neurological: no tremor with outstretched hands, DTR normal in all 4  ASSESSMENT: 1. DM2, non-insulin-dependent, uncontrolled, with complications - MAU  Cardiologist: Dr Rennis Golden  PLAN:  1. Patient with long-standing, uncontrolled diabetes, with more hyperglycemia spikes since last visit, but overall good control. At last visit, her HBA1c was high, but a fructosamine >> yielded a HbA1c of 6.8%! We will repeat this today. She had 2 low CBGs since last visit >> both in am >> she does not remember why. - she is out of Tanzeum and this will stop being made >> will need another GLP1 R agonist - she will need to check with insurance which  covered - I suggested to:  Patient Instructions  Please continue: - Metformin 1000 mg 2x a day, with meals - Glipizide ER XL 10 mg in am and 2.5 mg before dinner - Farxiga 10 mg in am  Try to switch Trulicity 1.5 mg weekly.  Please return in 3 months with your sugar log.   Alternatives for Tanzeum: - Ozempic - Bydureon  - Trulicity - Victoza (once a day) - Byetta (2x a day)  Or orals meds: - Tradjenta - Januvia - Nesina - Onglyza    - today, HbA1c is 9.8% >> will check a fructosamine - continue checking sugars at different times of the day - check 1-2x a day, rotating checks - advised for yearly eye exams >> she is UTD - Return to clinic in 3 mo with sugar log   Office Visit on 12/14/2016  Component Date Value Ref Range Status  . Fructosamine 12/14/2016 296* 190 - 270 umol/L Final   HbA1c calculated from fructosamine is MUCH better, at 6.6%.   Carlus Pavlov, MD PhD [facility] Endocrinology

## 2016-12-14 NOTE — Patient Instructions (Addendum)
Please continue: - Metformin 1000 mg 2x a day, with meals - Glipizide ER XL 10 mg in am and 2.5 mg before dinner - Farxiga 10 mg in am  Try to switch Trulicity 1.5 mg weekly.  Please return in 3 months with your sugar log.   Alternatives for Tanzeum: - Ozempic - Bydureon  - Trulicity - Victoza (once a day) - Byetta (2x a day)  Or orals meds: - Tradjenta - Januvia - Nesina - Onglyza

## 2016-12-18 ENCOUNTER — Encounter: Payer: Self-pay | Admitting: Internal Medicine

## 2016-12-19 ENCOUNTER — Other Ambulatory Visit: Payer: Self-pay | Admitting: *Deleted

## 2016-12-19 LAB — FRUCTOSAMINE: FRUCTOSAMINE: 296 umol/L — AB (ref 190–270)

## 2016-12-19 MED ORDER — CARVEDILOL 25 MG PO TABS: 37.5000 mg | ORAL_TABLET | Freq: Two times a day (BID) | ORAL | 3 refills | Status: DC

## 2017-01-19 ENCOUNTER — Other Ambulatory Visit: Payer: Self-pay | Admitting: Internal Medicine

## 2017-01-29 ENCOUNTER — Other Ambulatory Visit: Payer: Self-pay | Admitting: Internal Medicine

## 2017-01-29 DIAGNOSIS — I5022 Chronic systolic (congestive) heart failure: Secondary | ICD-10-CM

## 2017-02-18 ENCOUNTER — Other Ambulatory Visit: Payer: Self-pay | Admitting: Internal Medicine

## 2017-02-22 ENCOUNTER — Encounter: Payer: Self-pay | Admitting: Internal Medicine

## 2017-02-26 ENCOUNTER — Other Ambulatory Visit: Payer: Self-pay

## 2017-02-26 MED ORDER — LIRAGLUTIDE 18 MG/3ML ~~LOC~~ SOPN: PEN_INJECTOR | SUBCUTANEOUS | 1 refills | Status: DC

## 2017-03-19 ENCOUNTER — Other Ambulatory Visit: Payer: Self-pay | Admitting: Internal Medicine

## 2017-03-22 ENCOUNTER — Ambulatory Visit (INDEPENDENT_AMBULATORY_CARE_PROVIDER_SITE_OTHER): Payer: Managed Care, Other (non HMO) | Admitting: Internal Medicine

## 2017-03-22 ENCOUNTER — Encounter: Payer: Self-pay | Admitting: Internal Medicine

## 2017-03-22 VITALS — BP 132/84 | HR 74 | Ht 64.0 in | Wt 204.0 lb

## 2017-03-22 DIAGNOSIS — E1129 Type 2 diabetes mellitus with other diabetic kidney complication: Secondary | ICD-10-CM

## 2017-03-22 DIAGNOSIS — E669 Obesity, unspecified: Secondary | ICD-10-CM

## 2017-03-22 DIAGNOSIS — R809 Proteinuria, unspecified: Secondary | ICD-10-CM

## 2017-03-22 LAB — POCT GLYCOSYLATED HEMOGLOBIN (HGB A1C): Hemoglobin A1C: 8.4

## 2017-03-22 MED ORDER — DULAGLUTIDE 1.5 MG/0.5ML ~~LOC~~ SOAJ: SUBCUTANEOUS | 3 refills | Status: DC

## 2017-03-22 NOTE — Progress Notes (Signed)
Patient ID: Laurie Small, female   DOB: 16-Aug-1975, 41 y.o.   MRN: [id number]  HPI: Laurie Small is a 41 y.o.-year-old female, returning for f/u for DM2, dx in 2003 (after an episode of myocarditis), non-insulin-dependent, uncontrolled, with complications (microalbuminuria). Last visit 3 mo ago. New PCP: Dr. Donnetta Simpers.  Last hemoglobin A1c was: 12/14/2016: HbA1c calculated from fructosamine is 6.6%. 09/14/2016: HbA1c calculated from the fructosamine is MUCH lower, at 6.8%! Lab Results  Component Value Date   HGBA1C 9.26f 09/14/2016   HGBA1C 8.2 06/13/2016   HGBA1C 9.2 03/13/2016  05/31/2015: HbA1c 10% 09/15/2014: HbA1c 9.6% 04/23/2014: HbA1c 10.9% 01/20/2014: HbA1c 12.9%  Pt is on a regimen of: - Metformin 1000 mg 2x a day, with meals - Glipizide ER XL 10 mg in am and 2.5 mg before dinner and started to take 5 mg for a large meal - Farxiga 10 mg in am - Trulicity 1.5 mg weekly  >> Victoza 1.8 mg daily in am >> Trulicity 1.5 mg weekly She was on Onglyza (too expensive).  She was on JanuMet (stopped being effective), then Januvia >> stopped when starting Trulicity She was on Victoza (trial period). She refused insulin.  Pt checks her sugars 1-2x a day - per review of log: - am: 65, 68-140 >> 47 x1, 56 x1, 71-163, 173, 217, 229 >> 70, 98-142, 148, 161 - 2h after b'fast: n/c >> 198-292 >> n/c >> 115-205, 267 >> n/c - before lunch: n/c >> 178, 187 >> 176 >> n/c >> 126 >> n/c - 2h after lunch: n/c >> n/c >> 136 >> n/c >> 165 >> n/c - before dinner: n/c >> 136-205 >> n/c  - 2h after dinner: 146, 180-225, 334 (sweet tea) >> 200, 213, 217 >> 159 >> n/c  - bedtime: n/c >> 62-134 >> 84-129, 268 >> 67, 95, 230 >> 165-195, 214 - nighttime: n/c >> 85-142, 195 >> 73, 75, 214 >> n/c No lows. Lowest sugar was 135 >> 62 >> 98; ? hypoglycemia awareness.  Highest sugar was 334 >> 217 >> 214.  Glucometer: FreeStyle Ultra mini >> Relion  Pt's meals are: - Breakfast: fruit, yoghurt,  oatmeal, eggs, grits, bacon - Lunch: pasta, sandwich, salad - Dinner: meat + veggies - Snacks: 3 She works from home. She walks for exercise.  - + CKD, last BUN/creatinine:  Lab Results  Component Value Date   BUN 22 04/28/2016   CREATININE 1.35 (H) 04/28/2016   No results found for: GFRAA  No results found for: MICRALBCREAT  ACR  (09/15/2014): 44.4 ACR (01/20/2014): 79.9 On Ramipril. - last set of lipids (monitored by cardiology): 03/31/2016: 136/69/49/73 06/12/2014: 147/118/44/80 Lab Results  Component Value Date   CHOL 158 01/06/2013   HDL 67 01/06/2013   LDLCALC 66 01/06/2013   TRIG 123 01/06/2013   CHOLHDL 2.4 01/06/2013  On Lipitor. - last eye exam was in 06/2016 >> No DR  - denies numbness and tingling in her feet. + Heel spurs.  She also has a history of HTN, HL, history of pulmonary embolism. She has had parathyroid surgery in 2001.  She has CHF, believed postviral. She is off Digoxin, now on Corlanor, but this is now very expensive >> did not get it.   ROS: Constitutional: no weight gain/no weight loss, no fatigue, no subjective hyperthermia, no subjective hypothermia Eyes: no blurry vision, no xerophthalmia ENT: no sore throat, no nodules palpated in throat, no dysphagia, no odynophagia, no hoarseness Cardiovascular: no CP/no SOB/no palpitations/no leg swelling Respiratory: no cough/no  SOB/no wheezing Gastrointestinal: no N/no V/no D/no C/no acid reflux Musculoskeletal: no muscle aches/no joint aches Skin: no rashes, no hair loss Neurological: no tremors/no numbness/no tingling/no dizziness  I reviewed pt's medications, allergies, PMH, social hx, family hx, and changes were documented in the history of present illness. Otherwise, unchanged from my initial visit note.   Past Medical History:  Diagnosis Date  . Depression   . NICM (nonischemic cardiomyopathy) (HCC)    2D ECHO, 12/26/2011 - EF 45-50%, left ventricle borderline dilated   Past Surgical  History:  Procedure Laterality Date  . CARDIAC CATHETERIZATION  01/06/2002   Dilated nonsichemic cardiopmyopathy, EF 28%, at least 2+ angiographic mitral regurgitation   Social History   Social History  . Marital Status: Married    Spouse Name: N/A  . Number of Children: 1   Occupational History  .  claims benefits specialist    Social History Main Topics  . Smoking status: Never Smoker   . Smokeless tobacco: Not on file  . Alcohol Use: No  . Drug Use: No   Current Outpatient Prescriptions on File Prior to Visit  Medication Sig Dispense Refill  . allopurinol (ZYLOPRIM) 100 MG tablet Take 1 tablet by mouth daily.    Marland Kitchen aspirin 325 MG tablet Take 325 mg by mouth daily.    Marland Kitchen atorvastatin (LIPITOR) 40 MG tablet Take 1 tablet (40 mg total) by mouth daily at 6 PM. 90 tablet 3  . carvedilol (COREG) 25 MG tablet Take 1.5 tablets (37.5 mg total) by mouth 2 (two) times daily with a meal. 270 tablet 3  . colchicine 0.6 MG tablet Take 1 tablet (0.6 mg total) by mouth daily. 30 tablet 1  . diphenhydrAMINE (BENADRYL) 25 MG tablet Take 25 mg by mouth daily.    . Dulaglutide (TRULICITY) 1.5 MG/0.5ML SOPN Inject 1.5 mg weekly under skin 4 pen 11  . FARXIGA 10 MG TABS tablet TAKE 1 TABLET BY MOUTH DAILY 90 tablet 0  . glipiZIDE (GLUCOTROL XL) 5 MG 24 hr tablet TAKE 2 TABLETS BY MOUTH BEFORE BREAKFAST AND 1/2 TABLET BEFORE DINNER 90 tablet 0  . hydrochlorothiazide (HYDRODIURIL) 25 MG tablet Take 1 tablet (25 mg total) by mouth daily. 90 tablet 3  . hydrocortisone 2.5 % cream as needed.    . ivabradine (CORLANOR) 5 MG TABS tablet Take 1 tablet (5 mg total) by mouth 2 (two) times daily with a meal. 60 tablet 5  . liraglutide (VICTOZA) 18 MG/3ML SOPN Inject 1.8 mg before breakfast. 9 pen 1  . metFORMIN (GLUCOPHAGE) 1000 MG tablet Take 1 tablet by mouth 2 (two) times daily with a meal.   0  . predniSONE (DELTASONE) 5 MG tablet Take 5 mg by mouth as needed.    . ramipril (ALTACE) 5 MG capsule TAKE 2  CAPSULES BY MOUTH IN THE MORNING AND 1 CAPSULE BY MOUTH IN THE EVENING 270 capsule 0  . spironolactone (ALDACTONE) 25 MG tablet Take 0.5 tablets (12.5 mg total) by mouth daily. 45 tablet 3  . TANZEUM 50 MG PEN INJECT 50 MG INTO THE SKIN ONCE A WEEK. 12 each 1   No current facility-administered medications on file prior to visit.    Allergies  Allergen Reactions  . Betadine [Povidone Iodine]   . Iodine   . Peanuts [Peanut Oil]   . Shellfish Allergy   . Other Hives and Rash    Sunlight and Grass    Family History  Problem Relation Age of Onset  . Hypertension  Mother   . GI Bleed Mother   . Pneumonia Maternal Grandmother   . Cancer Paternal Grandmother        Breast cancer   PE: BP 132/84   Pulse 74   Ht 5\' 4"  (1.626 m)   Wt 204 lb (92.5 kg)   SpO2 98%   BMI 35.02 kg/m  Body mass index is 35.02 kg/m. Wt Readings from Last 3 Encounters:  03/22/17 204 lb (92.5 kg)  12/14/16 204 lb (92.5 kg)  09/28/16 205 lb 9.6 oz (93.3 kg)   Constitutional: overweight, in NAD Eyes: PERRLA, EOMI, no exophthalmos ENT: moist mucous membranes, no thyromegaly, no cervical lymphadenopathy Cardiovascular: RRR, No MRG Respiratory: CTA B Gastrointestinal: abdomen soft, NT, ND, BS+ Musculoskeletal: no deformities, strength intact in all 4 Skin: moist, warm, no rashes Neurological: no tremor with outstretched hands, DTR normal in all 4  ASSESSMENT: 1. DM2, non-insulin-dependent, uncontrolled, with complications - MAU  Cardiologist: Dr Rennis Golden  2. Obesity class 2 BMI Classification:  < 18.5 underweight   18.5-24.9 normal weight   25.0-29.9 overweight   30.0-34.9 class I obesity   35.0-39.9 class II obesity   ? 40.0 class III obesity    PLAN:  1. Patient with long-standing, uncontrolled Dm, with slightly better control lately, on a complex regimen, but w/o insulin. At this visit, sugars are variable in am, most at goal, but quite a few hyperglycemic spikes. I believe these are due  to dietary indiscretions at night >> she is trying to adjust her eating at night - fructosamine levels are more accurate for her >> will check one today - it turns out that her insurance is now covering Trulicity (free) >> will continue with this (has also Victoza at home) - I suggested to:  Patient Instructions  Please continue: - Metformin 1000 mg 2x a day, with meals - Glipizide ER XL 10 mg in am and 2.5 mg before dinner (increase to 5 mg for a large meal) - Farxiga 10 mg in am - Trulicity 1.5 mg weekly/Victoza 1.8 mg daily  Please return in 3-4 months with your sugar log.   - today, HbA1c is 8.4% (lower) - continue checking sugars at different times of the day - check 1-2x a day, rotating checks - advised for yearly eye exams >> she is UTD - Return to clinic in 3 mo with sugar log    2. Obesity class 2 - weight is stable - We discussed that at next visit we may be able to decrease glipizide if she continues to do well and improve her diet, and this will most likely help with weight loss  Office Visit on 03/22/2017  Component Date Value Ref Range Status  . Hemoglobin A1C 03/22/2017 8.4   Final  . Fructosamine 03/22/2017 254  190 - 270 umol/L Final   The HbA1c calculated from the fructosamine is 5.9%!    Carlus Pavlov, MD PhD [facility] Endocrinology

## 2017-03-22 NOTE — Patient Instructions (Addendum)
Please continue: - Metformin 1000 mg 2x a day, with meals - Glipizide ER XL 10 mg in am and 2.5 mg before dinner (increase to 5 mg for a large meal) - Farxiga 10 mg in am - Trulicity 1.5 mg weekly/Victoza 1.8 mg daily  Please return in 3-4 months with your sugar log.

## 2017-03-27 LAB — FRUCTOSAMINE: Fructosamine: 254 umol/L (ref 190–270)

## 2017-03-30 ENCOUNTER — Ambulatory Visit (INDEPENDENT_AMBULATORY_CARE_PROVIDER_SITE_OTHER): Payer: 59 | Admitting: Internal Medicine

## 2017-03-30 ENCOUNTER — Encounter: Payer: Self-pay | Admitting: Internal Medicine

## 2017-03-30 VITALS — BP 112/64 | HR 67 | Ht 62.0 in | Wt 202.0 lb

## 2017-03-30 DIAGNOSIS — I10 Essential (primary) hypertension: Secondary | ICD-10-CM

## 2017-03-30 DIAGNOSIS — I428 Other cardiomyopathies: Secondary | ICD-10-CM | POA: Diagnosis not present

## 2017-03-30 DIAGNOSIS — E669 Obesity, unspecified: Secondary | ICD-10-CM | POA: Diagnosis not present

## 2017-03-30 DIAGNOSIS — I5022 Chronic systolic (congestive) heart failure: Secondary | ICD-10-CM | POA: Diagnosis not present

## 2017-03-30 DIAGNOSIS — R000 Tachycardia, unspecified: Secondary | ICD-10-CM

## 2017-03-30 MED ORDER — CARVEDILOL 25 MG PO TABS: 37.5000 mg | ORAL_TABLET | Freq: Two times a day (BID) | ORAL | 3 refills | Status: DC

## 2017-03-30 MED ORDER — ATORVASTATIN CALCIUM 40 MG PO TABS: 40.0000 mg | ORAL_TABLET | Freq: Every day | ORAL | 3 refills | Status: DC

## 2017-03-30 MED ORDER — RAMIPRIL 5 MG PO CAPS: ORAL_CAPSULE | ORAL | 3 refills | Status: DC

## 2017-03-30 MED ORDER — SPIRONOLACTONE 25 MG PO TABS: 12.5000 mg | ORAL_TABLET | Freq: Every day | ORAL | 3 refills | Status: DC

## 2017-03-30 MED ORDER — HYDROCHLOROTHIAZIDE 25 MG PO TABS: 25.0000 mg | ORAL_TABLET | Freq: Every day | ORAL | 3 refills | Status: DC

## 2017-03-30 MED ORDER — IVABRADINE HCL 5 MG PO TABS: 5.0000 mg | ORAL_TABLET | Freq: Two times a day (BID) | ORAL | 3 refills | Status: DC

## 2017-03-30 NOTE — Patient Instructions (Addendum)
Medication Instructions: Your physician recommends that you continue on your current medications as directed. Please refer to the Current Medication list given to you today.  If you need a refill on your cardiac medications before your next appointment, please call your pharmacy.   Procedures/Testing: Your physician has requested that you have an echocardiogram to be done in late October. Echocardiography is a painless test that uses sound waves to create images of your heart. It provides your doctor with information about the size and shape of your heart and how well your heart's chambers and valves are working. This procedure takes approximately one hour. There are no restrictions for this procedure.  Your physician wants you to follow-up in: 12 months with Dr.Hilty. You will receive a reminder letter in the mail two months in advance. If you don't receive a letter, please call our office to schedule the follow-up appointment.     Thank you for choosing [facility] at [facility]!!

## 2017-03-30 NOTE — Progress Notes (Signed)
OFFICE NOTE  Chief Complaint:  Follow-up tachycardia  Primary Care Physician: Laurie Koch, MD  HPI:  Laurie Small is a 41 year old African American female with a history of nonischemic cardiomyopathy, thought probably virally mediated, with cardiac catheterization in 2003 that showed an EF of 25% to 30% and no obstructive coronary disease. In 2011, her EF was 45% to 50% by echocardiogram with medical therapy. Unfortunately, after her initial diagnosis, she developed a pulmonary embolus in 2003 and was on Coumadin for about 2 years, and now is on aspirin 325 mg daily. She also has diabetes, hypertension, and obesity. She recently she tripped in her house and developed a Small stress fracture and sprain of her left ankle which she has recovered from. She denies any shortness of breath with exertion, chest pain, palpitations, presyncope, syncopal symptoms.  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 She continues to feel at baseline. Interestingly, her heart rate is lower than it was at her last office visit.  Laurie Small returns today for followup. She reports doing well denies any shortness of breath or chest pain with exertion. Unfortunately she's not been able to lose any significant amount of weight. She has had some adjustments in her diabetes medicine with the addition of glipizide, she is also taking Onglyza and metformin 1000 mg twice daily.  She is interested in possibly getting off of some of her medications.  Saw Laurie Small back in the office today. Overall she is doing well without any symptoms of heart failure. Blood sugar has been better controlled. Weight appears to be down just a few pounds. EKG looks normal today. Her last echocardiogram however did not show significant improvement in EF, therefore I recommended we remain on her current heart failure medications. Currently she has NYHA class I symptoms.  04/28/2016  Laurie Small  returns today for follow-up. In the past year she's done fairly well. She denies any chest pain or worsening shortness of breath. Unfortunately she's been struggling with left plantar fasciitis and she is in a walking boot today. She's actually lost 46 pounds since we last saw her. She continues to have class I heart failure symptoms. Interestingly she is tachycardic today. Initially this was thought to be due to her rushing in the office however her heart rate remained elevated even at the end of the visit. She reports compliance with her beta blocker which is actually a high dose. I don't know if this is related to worsening heart failure with sympathetic activation. She was supposed to have an echo a year ago but we did not obtain that and I like to reassess LV function since his been 2 years since her last study. Her last EF was 40-45%. Stress new heart failure treatments that are available today including Entresto and Corlanor - which may be of some benefit.  06/21/2016  Laurie Small was seen in follow-up today. A repeat echo still shows her LVEF to be around 40%. She remains persistently tachycardiac with HR in the upper 90's and low 100's. This appears to be sinus. Blood pressure runs in the 110-120 systolic range and there is little room to increase her carvedilol which is already at 25 mg BID. She was on digoxin, but it did not seem to affect her heartrate, so I discontinued it. She may be a candidate for Corlanor. A big question is whether she has persistent, inappropriate sinus tachycardia and whether this is contributing to her cardiomyopathy.  07/28/2016  Mrs. Laurie Grant  Salomon Small returns today for follow-up. Her heart rate remains in the upper 90s. Her monitor showed heart rates between the 70s and 130s, however she is persistently in the low 90s and 100s. Blood pressure is a little higher today and she may tolerate an increase in her carvedilol. We discussed adding Corlanor, however she is not  interested this time due to cost issues.  08/29/2016  Laurie Small returns for follow-up. I increased her carvedilol and blood pressure now is more in the 110-120 systolic range. Heart rate remains elevated in the 80s and 90s. This is actually on except behind given her low ejection fraction of 40%.  I discussed this with Dr. Gala Romney and although EF is not less than 35%, there is probably good reason to consider trying it for HR improvement.  09/28/2016  Laurie Small returns today for follow-up. Interestingly her heart rate has improved in general on Ivabradine. It appears from her home measurements that her heart rate is somewhere around 70. She is on the 5 mg twice a day dosing. The only side effect she notices are the visual disturbances which seems to be worse in the morning but gets better fairly quickly.  03/30/2017  Laurie Small was seen today in follow-up. She seems to noted an improvement in her heart rate on Ivabradine. Heart rate today is 67 and in general she's not had any significant tachycardia or inappropriate increases in heart rate with minimal exertion. In addition she is on carvedilol. She denies any chest pain. She occasionally has some visual disturbances in the morning however it seems to be better as the day goes on and not associated to the dose of medicine.  PMHx:  Past Medical History:  Diagnosis Date  . Depression   . NICM (nonischemic cardiomyopathy) (HCC)    2D ECHO, 12/26/2011 - EF 45-50%, left ventricle borderline dilated    Past Surgical History:  Procedure Laterality Date  . CARDIAC CATHETERIZATION  01/06/2002   Dilated nonsichemic cardiopmyopathy, EF 28%, at least 2+ angiographic mitral regurgitation    FAMHx:  Family History  Problem Relation Age of Onset  . Hypertension Mother   . GI Bleed Mother   . Pneumonia Maternal Grandmother   . Cancer Paternal Grandmother        Breast cancer    SOCHx:   reports that she has never smoked. She  has never used smokeless tobacco. She reports that she does not drink alcohol or use drugs.  ALLERGIES:  Allergies  Allergen Reactions  . Betadine [Povidone Iodine]   . Iodine   . Peanuts [Peanut Oil]   . Shellfish Allergy   . Other Hives and Rash    Sunlight and Grass     ROS: Pertinent items noted in HPI and remainder of comprehensive ROS otherwise negative.  HOME MEDS: Current Outpatient Prescriptions  Medication Sig Dispense Refill  . allopurinol (ZYLOPRIM) 100 MG tablet Take 1 tablet by mouth daily.    Marland Kitchen aspirin 325 MG tablet Take 325 mg by mouth daily.    Marland Kitchen atorvastatin (LIPITOR) 40 MG tablet Take 1 tablet (40 mg total) by mouth daily at 6 PM. 90 tablet 3  . carvedilol (COREG) 25 MG tablet Take 1.5 tablets (37.5 mg total) by mouth 2 (two) times daily with a meal. 270 tablet 3  . colchicine 0.6 MG tablet Take 1 tablet (0.6 mg total) by mouth daily. 30 tablet 1  . diphenhydrAMINE (BENADRYL) 25 MG tablet Take 25 mg by mouth daily.    Marland Kitchen  Dulaglutide (TRULICITY) 1.5 MG/0.5ML SOPN Inject 1.5 mg weekly under skin 12 pen 3  . FARXIGA 10 MG TABS tablet TAKE 1 TABLET BY MOUTH DAILY 90 tablet 0  . glipiZIDE (GLUCOTROL XL) 5 MG 24 hr tablet TAKE 2 TABLETS BY MOUTH BEFORE BREAKFAST AND 1/2 TABLET BEFORE DINNER 90 tablet 0  . hydrochlorothiazide (HYDRODIURIL) 25 MG tablet Take 1 tablet (25 mg total) by mouth daily. 90 tablet 3  . hydrocortisone 2.5 % cream as needed.    . ivabradine (CORLANOR) 5 MG TABS tablet Take 1 tablet (5 mg total) by mouth 2 (two) times daily with a meal. 180 tablet 3  . liraglutide (VICTOZA) 18 MG/3ML SOPN Inject 1.8 mg before breakfast. 9 pen 1  . metFORMIN (GLUCOPHAGE) 1000 MG tablet Take 1 tablet by mouth 2 (two) times daily with a meal.   0  . predniSONE (DELTASONE) 5 MG tablet Take 5 mg by mouth as needed.    . ramipril (ALTACE) 5 MG capsule TAKE 2 CAPSULES BY MOUTH IN THE MORNING AND 1 CAPSULE BY MOUTH IN THE EVENING 270 capsule 3  . spironolactone (ALDACTONE)  25 MG tablet Take 0.5 tablets (12.5 mg total) by mouth daily. 45 tablet 3   No current facility-administered medications for this visit.     LABS/IMAGING: No results found for this or any previous visit (from the past 48 hour(s)). No results found.  VITALS: BP 112/64   Pulse 67   Ht 5\' 2"  (1.575 m)   Wt 202 lb (91.6 kg)   BMI 36.95 kg/m   EXAM: General appearance: alert and no distress Neck: no carotid bruit, no JVD and thyroid not enlarged, symmetric, no tenderness/mass/nodules Lungs: clear to auscultation bilaterally Heart: regular rate and rhythm, S1, S2 normal, no murmur, click, rub or gallop Abdomen: soft, non-tender; bowel sounds normal; no masses,  no organomegaly and obese Extremities: extremities normal, atraumatic, no cyanosis or edema Pulses: 2+ and symmetric Skin: Skin color, texture, turgor normal. No rashes or lesions Neurologic: Grossly normal Psych: Pleasant  EKG: Normal sinus rhythm at 67, left axis deviation-personally reviewed  ASSESSMENT: 1. Nonischemic cardiomyopathy, EF 40-45% (2017) - NYHA class I symptoms 2. History of pulmonary embolism 3. Diabetes type 2 - controlled 4. Hypertension - controlled 5. Obesity  PLAN: 1.    Ms. Small has had improvement in her tachycardia on both Ivabradine and carvedilol. She continues to need to work on weight and has decent blood pressure control. We'll plan to reassess her a cardiomyopathy next year. Follow-up with me afterwards but for now continue her current medicines.  Chrystie Nose, MD, [facility] Attending Cardiologist [facility]  Chrystie Nose 03/30/2017, 1:41 PM

## 2017-04-12 ENCOUNTER — Other Ambulatory Visit: Payer: Self-pay | Admitting: Internal Medicine

## 2017-05-12 ENCOUNTER — Other Ambulatory Visit: Payer: Self-pay | Admitting: Internal Medicine

## 2017-05-14 ENCOUNTER — Other Ambulatory Visit: Payer: Self-pay

## 2017-05-14 ENCOUNTER — Ambulatory Visit (HOSPITAL_COMMUNITY): Payer: 59 | Attending: Cardiology

## 2017-05-14 DIAGNOSIS — I361 Nonrheumatic tricuspid (valve) insufficiency: Secondary | ICD-10-CM | POA: Diagnosis not present

## 2017-05-14 DIAGNOSIS — I428 Other cardiomyopathies: Secondary | ICD-10-CM | POA: Diagnosis not present

## 2017-05-14 DIAGNOSIS — I5022 Chronic systolic (congestive) heart failure: Secondary | ICD-10-CM | POA: Diagnosis not present

## 2017-05-28 ENCOUNTER — Other Ambulatory Visit: Payer: Self-pay | Admitting: Internal Medicine

## 2017-06-10 ENCOUNTER — Other Ambulatory Visit: Payer: Self-pay | Admitting: Internal Medicine

## 2017-06-21 ENCOUNTER — Other Ambulatory Visit: Payer: Self-pay | Admitting: Internal Medicine

## 2017-07-27 ENCOUNTER — Other Ambulatory Visit: Payer: Self-pay | Admitting: Family Medicine

## 2017-07-27 DIAGNOSIS — Z139 Encounter for screening, unspecified: Secondary | ICD-10-CM

## 2017-08-09 ENCOUNTER — Other Ambulatory Visit: Payer: Self-pay | Admitting: Internal Medicine

## 2017-08-15 ENCOUNTER — Ambulatory Visit
Admission: RE | Admit: 2017-08-15 | Discharge: 2017-08-15 | Disposition: A | Discharge status: Home / Self Care | Payer: 59 | Source: Ambulatory Visit | Attending: Family Medicine | Admitting: Family Medicine

## 2017-08-15 DIAGNOSIS — Z139 Encounter for screening, unspecified: Secondary | ICD-10-CM

## 2017-09-04 ENCOUNTER — Other Ambulatory Visit: Payer: Self-pay | Admitting: Internal Medicine

## 2017-09-09 ENCOUNTER — Other Ambulatory Visit: Payer: Self-pay | Admitting: Internal Medicine

## 2017-09-28 ENCOUNTER — Ambulatory Visit (INDEPENDENT_AMBULATORY_CARE_PROVIDER_SITE_OTHER): Payer: 59 | Admitting: Internal Medicine

## 2017-09-28 ENCOUNTER — Encounter: Payer: Self-pay | Admitting: Internal Medicine

## 2017-09-28 VITALS — BP 118/74 | HR 76 | Ht 62.0 in | Wt 206.4 lb

## 2017-09-28 DIAGNOSIS — R809 Proteinuria, unspecified: Secondary | ICD-10-CM

## 2017-09-28 DIAGNOSIS — E1129 Type 2 diabetes mellitus with other diabetic kidney complication: Secondary | ICD-10-CM

## 2017-09-28 DIAGNOSIS — E669 Obesity, unspecified: Secondary | ICD-10-CM | POA: Diagnosis not present

## 2017-09-28 LAB — POCT GLYCOSYLATED HEMOGLOBIN (HGB A1C): Hemoglobin A1C: 9.3

## 2017-09-28 MED ORDER — METFORMIN HCL 1000 MG PO TABS: 1000.0000 mg | ORAL_TABLET | Freq: Two times a day (BID) | ORAL | 3 refills | Status: DC

## 2017-09-28 MED ORDER — GLIPIZIDE ER 5 MG PO TB24: ORAL_TABLET | ORAL | 3 refills | Status: DC

## 2017-09-28 MED ORDER — DAPAGLIFLOZIN PROPANEDIOL 10 MG PO TABS: 10.0000 mg | ORAL_TABLET | Freq: Every day | ORAL | 3 refills | Status: DC

## 2017-09-28 NOTE — Progress Notes (Signed)
Patient ID: Laurie Small, female   DOB: 17-Jul-1976, 42 y.o.   MRN: [id number]  HPI: Laurie Small is a 42 y.o.-year-old female, returning for f/u for DM2, dx in 2003 (after an episode of myocarditis), non-insulin-dependent, uncontrolled, with complications (microalbuminuria). Last visit 7 mo ago. PCP: Dr. Hoyt Koch.  She had a URI in 08/2017.   She had a recent 2D echo by her cardiologist and her EF has improved to 50-55%.  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  Last hemoglobin A1c was: 03/22/2017: The HbA1c calculated from the fructosamine is 5.9%!  12/14/2016: HbA1c calculated from fructosamine is 6.6%. 09/14/2016: HbA1c calculated from the fructosamine is MUCH lower, at 6.8%! Lab Results  Component Value Date   HGBA1C 8.4 03/22/2017   HGBA1C 9.56f 09/14/2016   HGBA1C 8.2 06/13/2016  05/31/2015: HbA1c 10% 09/15/2014: HbA1c 9.6% 04/23/2014: HbA1c 10.9% 01/20/2014: HbA1c 12.9%  Pt is on a regimen of: - Metformin 100 mg 2x a day, with meals - Glipizide ER XL 10 mg in am and 2.5-5 mg before dinner - Farxiga 10 mg in am - Trulicity 1.5 mg weekly  >> Victoza 1.8 mg daily in am >> Trulicity 1.5 mg weekly She was on Onglyza (too expensive).  She was on JanuMet (stopped being effective), then Januvia >> stopped when starting Trulicity She was on Victoza (trial period). She refused insulin.  Pt checks her sugars 1-2x a day -per review of her log - am: 70, 98-142, 148, 161 >> 91-180 (after Robitussin), 204 (higher when sick) - 2h after b'fast: n/c >> 198-292 >> n/c >> 115-205, 267 >> n/c - before lunch: n/c >> 178, 187 >> 176 >> n/c >> 126 >> n/c - 2h after lunch: n/c >> n/c >> 136 >> n/c >> 165 >> n/c - before dinner: n/c >> 136-205 >> n/c  - 2h after dinner: 146, 180-225, 334 (sweet tea) >> 200, 213, 217 >> 159 >> n/c  - bedtime: n/c >> 62-134 >> 84-129, 268 >> 67, 95, 230 >> 165-195, 214 >> n/c - nighttime: n/c >> 85-142, 195 >> 73, 75, 214 >> n/c Lowest  sugar was 98 >> 90; it is unclear at which level she has hypoglycemia awareness. Highest sugar was 214 >> 204.  Glucometer: FreeStyle Ultra mini >> Relion  Pt's meals are: - Breakfast: fruit, yoghurt, oatmeal, eggs, grits, bacon - Lunch: pasta, sandwich, salad - Dinner: meat + veggies - Snacks: 3 She works from home. No formal exercise.  -+ CKD, last BUN/creatinine:  Lab Results  Component Value Date   BUN 22 04/28/2016   CREATININE 1.35 (H) 04/28/2016   No results found for: GFRAA  No results found for: MICRALBCREAT  ACR  (09/15/2014): 44.4 ACR (01/20/2014): 79.9 On ramipril.  - l + HL; last set of lipids (monitored by cardiology): 03/31/2016: 136/69/49/73 06/12/2014: 147/118/44/80 Lab Results  Component Value Date   CHOL 158 01/06/2013   HDL 67 01/06/2013   LDLCALC 66 01/06/2013   TRIG 123 01/06/2013   CHOLHDL 2.4 01/06/2013  On Lipitor. - last eye exam was in 06/2016: No DR - No numbness and tingling in her feet. + Heel spurs.  She also has a history of HTN, history of pulmonary embolism. She has had parathyroid surgery in 2001.  She has CHF, believed postviral. She stopped Digoxin >> started Corlanor (still expensive).  ROS: Constitutional: no weight gain/no weight loss, + fatigue, + subjective hyperthermia, no subjective hypothermia Eyes: no blurry vision, no xerophthalmia ENT: no sore  throat, no nodules palpated in throat, no dysphagia, no odynophagia, no hoarseness Cardiovascular: no CP/no SOB/no palpitations/no leg swelling Respiratory: + cough/no SOB/no wheezing Gastrointestinal: no N/no V/no D/no C/no acid reflux Musculoskeletal: no muscle aches/no joint aches Skin: no rashes, no hair loss Neurological: no tremors/no numbness/no tingling/no dizziness, + headache  I reviewed pt's medications, allergies, PMH, social hx, family hx, and changes were documented in the history of present illness. Otherwise, unchanged from my initial visit note.   Past  Medical History:  Diagnosis Date  . Depression   . NICM (nonischemic cardiomyopathy) (HCC)    2D ECHO, 12/26/2011 - EF 45-50%, left ventricle borderline dilated   Past Surgical History:  Procedure Laterality Date  . CARDIAC CATHETERIZATION  01/06/2002   Dilated nonsichemic cardiopmyopathy, EF 28%, at least 2+ angiographic mitral regurgitation   Social History   Social History  . Marital Status: Married    Spouse Name: N/A  . Number of Children: 1   Occupational History  .  claims benefits specialist    Social History Main Topics  . Smoking status: Never Smoker   . Smokeless tobacco: Not on file  . Alcohol Use: No  . Drug Use: No   Current Outpatient Medications on File Prior to Visit  Medication Sig Dispense Refill  . allopurinol (ZYLOPRIM) 100 MG tablet Take 1 tablet by mouth daily.    Marland Kitchen aspirin 325 MG tablet Take 325 mg by mouth daily.    Marland Kitchen atorvastatin (LIPITOR) 40 MG tablet Take 1 tablet (40 mg total) by mouth daily at 6 PM. 90 tablet 3  . carvedilol (COREG) 25 MG tablet TAKE 1 TABLET (25 MG TOTAL) BY MOUTH 2 (TWO) TIMES DAILY WITH A MEAL. 180 tablet 3  . colchicine 0.6 MG tablet Take 1 tablet (0.6 mg total) by mouth daily. 30 tablet 1  . diphenhydrAMINE (BENADRYL) 25 MG tablet Take 25 mg by mouth daily.    . Dulaglutide (TRULICITY) 1.5 MG/0.5ML SOPN Inject 1.5 mg weekly under skin 12 pen 3  . FARXIGA 10 MG TABS tablet TAKE 1 TABLET BY MOUTH DAILY 90 tablet 0  . glipiZIDE (GLUCOTROL XL) 5 MG 24 hr tablet TAKE 2 TABLETS BY MOUTH BEFORE BREAKFAST AND 1/2 TABLET BY MOUTH BEFORE DINNER 75 tablet 0  . hydrochlorothiazide (HYDRODIURIL) 25 MG tablet Take 1 tablet (25 mg total) by mouth daily. 90 tablet 3  . hydrocortisone 2.5 % cream as needed.    . ivabradine (CORLANOR) 5 MG TABS tablet Take 1 tablet (5 mg total) by mouth 2 (two) times daily with a meal. 180 tablet 3  . liraglutide (VICTOZA) 18 MG/3ML SOPN INJECT 1.8 MG BEFORE BREAKFAST. 27 pen 0  . metFORMIN (GLUCOPHAGE) 1000 MG  tablet Take 1 tablet by mouth 2 (two) times daily with a meal.   0  . predniSONE (DELTASONE) 5 MG tablet Take 5 mg by mouth as needed.    . ramipril (ALTACE) 5 MG capsule TAKE 2 CAPSULES BY MOUTH IN THE MORNING AND 1 CAPSULE BY MOUTH IN THE EVENING 270 capsule 3  . spironolactone (ALDACTONE) 25 MG tablet Take 0.5 tablets (12.5 mg total) by mouth daily. 45 tablet 3   No current facility-administered medications on file prior to visit.    Allergies  Allergen Reactions  . Betadine [Povidone Iodine]   . Iodine   . Peanuts [Peanut Oil]   . Shellfish Allergy   . Other Hives and Rash    Sunlight and Grass    Family History  Problem Relation Age of Onset  . Hypertension Mother   . GI Bleed Mother   . Pneumonia Maternal Grandmother   . Cancer Paternal Grandmother        Breast cancer  . Breast cancer Paternal Grandmother    PE: BP 118/74 (BP Location: Left Arm, Patient Position: Sitting, Cuff Size: Large)   Pulse 76   Ht 5\' 2"  (1.575 m)   Wt 206 lb 6.4 oz (93.6 kg)   SpO2 99%   BMI 37.75 kg/m  Body mass index is 37.75 kg/m. Wt Readings from Last 3 Encounters:  09/28/17 206 lb 6.4 oz (93.6 kg)  03/30/17 202 lb (91.6 kg)  03/22/17 204 lb (92.5 kg)   Constitutional: overweight, in NAD Eyes: PERRLA, EOMI, no exophthalmos ENT: moist mucous membranes, no thyromegaly, no cervical lymphadenopathy Cardiovascular: RRR, No MRG Respiratory: CTA B Gastrointestinal: abdomen soft, NT, ND, BS+ Musculoskeletal: no deformities, strength intact in all 4 Skin: moist, warm, no rashes Neurological: no tremor with outstretched hands, DTR normal in all 4  ASSESSMENT: 1. DM2, non-insulin-dependent, uncontrolled, with complications - MAU  Cardiologist: Dr Rennis Golden  2. Obesity class 2 BMI Classification:  < 18.5 underweight   18.5-24.9 normal weight   25.0-29.9 overweight   30.0-34.9 class I obesity   35.0-39.9 class II obesity   ? 40.0 class III obesity    PLAN:  1. Patient with  long-standing, uncontrolled, type 2 diabetes, on a complex medication regimen, with better control at last visit.  An HbA1c calculated from fructosamine was 5.9%.  Her sugars were variable in a.m., mostly at goal, with few hyperglycemic spikes due to dietary indiscretions.  She was working on adjusting her eating at night.  We did not c change her regimen at last visit. - sugars are more variable >> higher in am - she plans to start going to [employer] with her husband - she would like to stay on the same regimen for now (again refuses insulin) - Fructosamine levels are more accurate for her, we will check one today - I suggested to:  Patient Instructions  Please continue: - Metformin 1000 mg 2x a day, with meals - Glipizide ER XL 10 mg in am and 2.5-5 mg before dinner - Farxiga 10 mg in am - Trulicity 1.5 mg weekly  Please return in 3 months with your sugar log.   - today, HbA1c is 9.3% (higher) - continue checking sugars at different times of the day - check 1x a day, rotating checks - advised for yearly eye exams >> she is not UTD - Will need to obtain records of her latest CMP and lipid panel from PCP - Return to clinic in 3 mo with sugar log     2. Obesity class 2 -Weight increased a little from last visit -We will continue the SGLT2 inhibitor and GLP-1 receptor agonist, which should both help -She is also planning to start exercise, which should help  I will addend the result  of the fructosamine when it becomes available.  Carlus Pavlov, MD PhD [facility] Endocrin[facility]

## 2017-09-28 NOTE — Patient Instructions (Addendum)
Please continue: - Metformin 1000 mg 2x a day, with meals - Glipizide ER XL 10 mg in am and 2.5-5 mg before dinner - Farxiga 10 mg in am - Trulicity 1.5 mg weekly  Please return in 3 months with your sugar log.

## 2017-10-01 ENCOUNTER — Other Ambulatory Visit: Payer: Self-pay | Admitting: Internal Medicine

## 2017-10-31 ENCOUNTER — Encounter: Payer: Self-pay | Admitting: Internal Medicine

## 2017-11-05 MED ORDER — INSULIN PEN NEEDLE 32G X 6 MM MISC: 11 refills | Status: DC

## 2017-11-08 ENCOUNTER — Other Ambulatory Visit: Payer: Self-pay

## 2018-01-07 ENCOUNTER — Encounter: Payer: Self-pay | Admitting: Internal Medicine

## 2018-01-07 ENCOUNTER — Ambulatory Visit (INDEPENDENT_AMBULATORY_CARE_PROVIDER_SITE_OTHER): Payer: 59 | Admitting: Internal Medicine

## 2018-01-07 VITALS — BP 122/80 | HR 69 | Ht 62.0 in | Wt 206.0 lb

## 2018-01-07 DIAGNOSIS — R809 Proteinuria, unspecified: Secondary | ICD-10-CM | POA: Diagnosis not present

## 2018-01-07 DIAGNOSIS — E785 Hyperlipidemia, unspecified: Secondary | ICD-10-CM | POA: Diagnosis not present

## 2018-01-07 DIAGNOSIS — E1129 Type 2 diabetes mellitus with other diabetic kidney complication: Secondary | ICD-10-CM | POA: Diagnosis not present

## 2018-01-07 DIAGNOSIS — E669 Obesity, unspecified: Secondary | ICD-10-CM

## 2018-01-07 LAB — MICROALBUMIN / CREATININE URINE RATIO
CREATININE, U: 67.5 mg/dL
MICROALB/CREAT RATIO: 1 mg/g (ref 0.0–30.0)
Microalb, Ur: <0.7 mg/dL (ref 0.0–1.9)

## 2018-01-07 LAB — LIPID PANEL
Cholesterol: 128 mg/dL (ref 0–200)
HDL: 49.6 mg/dL (ref >39.00)
LDL Cholesterol: 61 mg/dL (ref 0–99)
NONHDL: 78.61
TRIGLYCERIDES: 90 mg/dL (ref 0.0–149.0)
Total CHOL/HDL Ratio: 3
VLDL: 18 mg/dL (ref 0.0–40.0)

## 2018-01-07 LAB — POCT GLYCOSYLATED HEMOGLOBIN (HGB A1C): HEMOGLOBIN A1C: 9 % — AB (ref 4.0–5.6)

## 2018-01-07 MED ORDER — LIRAGLUTIDE 18 MG/3ML ~~LOC~~ SOPN: 1.8000 mg | PEN_INJECTOR | Freq: Every day | SUBCUTANEOUS | 3 refills | Status: DC

## 2018-01-07 NOTE — Progress Notes (Signed)
Patient ID: Laurie Small, female   DOB: 1976-05-06, 42 y.o.   MRN: [id number]  HPI: Laurie Small is a 42 y.o.-year-old female, returning for f/u for DM2, dx in 2003 (after an episode of myocarditis), non-insulin-dependent, uncontrolled, with complications (microalbuminuria). Last visit 3 mo ago. PCP: Dr. Hoyt Small >> left practice that she is looking for a new PCP.  She started walking with her husband 1h a day 1.5 mo ago.   Last hemoglobin A1c was: 03/22/2017: The HbA1c calculated from the fructosamine is 5.9%!  12/14/2016: HbA1c calculated from fructosamine is 6.6%. 09/14/2016: HbA1c calculated from the fructosamine is MUCH lower, at 6.8%! Lab Results  Component Value Date   HGBA1C 9.3 09/28/2017   HGBA1C 8.4 03/22/2017   HGBA1C 9.64f 09/14/2016  05/31/2015: HbA1c 10% 09/15/2014: HbA1c 9.6% 04/23/2014: HbA1c 10.9% 01/20/2014: HbA1c 12.9%  Pt is on a regimen of: - Metformin 1000 mg 2x a day, with meals - Glipizide ER XL 10 mg in am and 2.5-5 mg before dinner - Farxiga 10 mg in am - Trulicity 1.5 mg weekly  >> Victoza 1.8 mg daily in a.m. due to price  She was on Onglyza (too expensive).  She was on JanuMet (stopped being effective), then Januvia >> stopped when starting Trulicity She was on Victoza (trial period). She refused insulin.  Pt checks her sugars 1-2 X a day: Per review of her log: - am: 91-180, 204 (higher when sick) >> 79-171, 190 - 2h after b'fast:n/c >> 115-205, 267 >> n/c - before lunch:176 >> n/c >> 126 >> n/c - 2h after lunch: 136 >> n/c >> 165 >> n/c - before dinner: n/c >> 136-205 >> n/c  - 2h after dinner: 200, 213, 217 >> 159 >> n/c >> 134 - bedtime:67, 95, 230 >> 165-195, 214 >> n/c >> 115-193, 206 - nighttime: 85-142, 195 >> 73, 75, 214 >> n/c Lowest sugar was 90 >> 79; it is unclear at which level she has hypoglycemia awareness Highest sugar was 204 >> 300s in 09/2017 >> 206.  Glucometer: FreeStyle Ultra mini >> Relion  Pt's meals  are: - Breakfast: fruit, yoghurt, oatmeal, eggs, grits, bacon - Lunch: pasta, sandwich, salad - Dinner: meat + veggies - Snacks: 3 She works from home.  -+ CKD, last BUN/creatinine:  Lab Results  Component Value Date   BUN 22 04/28/2016   CREATININE 1.35 (H) 04/28/2016   No results found for: GFRAA  No results found for: MICRALBCREAT  ACR  (09/15/2014): 44.4 ACR (01/20/2014): 79.9 On ramipril.  - + HL; last set of lipids ( monitored by cardiology): 03/31/2016: 136/69/49/73 06/12/2014: 147/118/44/80 Lab Results  Component Value Date   CHOL 158 01/06/2013   HDL 67 01/06/2013   LDLCALC 66 01/06/2013   TRIG 123 01/06/2013   CHOLHDL 2.4 01/06/2013  On Lipitor. - last eye exam was in  06/2016: No DR - No numbness and tingling in her feet. + Heel spurs.  She also has a history of HTN, PE; she has had parathyroid surgery in 2001.  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 She stopped Digoxin >> on Corlanor now. 2D echo by her cardiologist >> EF has improved to 50-55%.  ROS: Constitutional: no weight gain/no weight loss, no fatigue, no subjective hyperthermia, no subjective hypothermia Eyes: no blurry vision, no xerophthalmia ENT: no sore throat, no nodules palpated in throat, no dysphagia, no odynophagia, no hoarseness Cardiovascular: no CP/no SOB/no palpitations/no leg swelling Respiratory: no cough/no SOB/no wheezing Gastrointestinal: no N/no V/no D/no  C/no acid reflux Musculoskeletal: no muscle aches/no joint aches Skin: no rashes, no hair loss Neurological: no tremors/no numbness/no tingling/no dizziness, + HAs - thinks from Corlanor  I reviewed pt's medications, allergies, PMH, social hx, family hx, and changes were documented in the history of present illness. Otherwise, unchanged from my initial visit note.  Past Medical History:  Diagnosis Date  . Depression   . NICM (nonischemic cardiomyopathy) (HCC)    2D ECHO, 12/26/2011 - EF 45-50%, left ventricle borderline  dilated   Past Surgical History:  Procedure Laterality Date  . CARDIAC CATHETERIZATION  01/06/2002   Dilated nonsichemic cardiopmyopathy, EF 28%, at least 2+ angiographic mitral regurgitation   Social History   Social History  . Marital Status: Married    Spouse Name: N/A  . Number of Children: 1   Occupational History  .  claims benefits specialist    Social History Main Topics  . Smoking status: Never Smoker   . Smokeless tobacco: Not on file  . Alcohol Use: No  . Drug Use: No   Current Outpatient Medications on File Prior to Visit  Medication Sig Dispense Refill  . allopurinol (ZYLOPRIM) 100 MG tablet Take 1 tablet by mouth daily.    Marland Kitchen aspirin 325 MG tablet Take 325 mg by mouth daily.    Marland Kitchen atorvastatin (LIPITOR) 40 MG tablet Take 1 tablet (40 mg total) by mouth daily at 6 PM. 90 tablet 3  . carvedilol (COREG) 25 MG tablet TAKE 1 TABLET (25 MG TOTAL) BY MOUTH 2 (TWO) TIMES DAILY WITH A MEAL. 180 tablet 3  . colchicine 0.6 MG tablet Take 1 tablet (0.6 mg total) by mouth daily. 30 tablet 1  . dapagliflozin propanediol (FARXIGA) 10 MG TABS tablet Take 10 mg by mouth daily. 90 tablet 3  . diphenhydrAMINE (BENADRYL) 25 MG tablet Take 25 mg by mouth daily.    . Dulaglutide (TRULICITY) 1.5 MG/0.5ML SOPN Inject 1.5 mg weekly under skin 12 pen 3  . glipiZIDE (GLUCOTROL XL) 5 MG 24 hr tablet TAKE 2 TABLETS BY MOUTH BEFORE BREAKFAST AND 1/2 TABLET BY MOUTH BEFORE DINNER 215 tablet 3  . hydrochlorothiazide (HYDRODIURIL) 25 MG tablet Take 1 tablet (25 mg total) by mouth daily. 90 tablet 3  . hydrocortisone 2.5 % cream as needed.    . Insulin Pen Needle (BD PEN NEEDLE MICRO U/F) 32G X 6 MM MISC Use as directed 100 each 11  . ivabradine (CORLANOR) 5 MG TABS tablet Take 1 tablet (5 mg total) by mouth 2 (two) times daily with a meal. 180 tablet 3  . metFORMIN (GLUCOPHAGE) 1000 MG tablet Take 1 tablet (1,000 mg total) by mouth 2 (two) times daily with a meal. 180 tablet 3  . predniSONE  (DELTASONE) 5 MG tablet Take 5 mg by mouth as needed.    . ramipril (ALTACE) 5 MG capsule TAKE 2 CAPSULES BY MOUTH IN THE MORNING AND 1 CAPSULE BY MOUTH IN THE EVENING 270 capsule 3  . spironolactone (ALDACTONE) 25 MG tablet TAKE 0.5 TABLETS (12.5 MG TOTAL) BY MOUTH DAILY. 45 tablet 1   No current facility-administered medications on file prior to visit.    Allergies  Allergen Reactions  . Betadine [Povidone Iodine]   . Iodine   . Peanuts [Peanut Oil]   . Shellfish Allergy   . Other Hives and Rash    Sunlight and Grass    Family History  Problem Relation Age of Onset  . Hypertension Mother   . GI Bleed Mother   .  Pneumonia Maternal Grandmother   . Cancer Paternal Grandmother        Breast cancer  . Breast cancer Paternal Grandmother    PE: BP 122/80   Pulse 69   Ht 5\' 2"  (1.575 m)   Wt 206 lb (93.4 kg)   LMP 12/19/2017   SpO2 98%   BMI 37.68 kg/m  Body mass index is 37.68 kg/m. Wt Readings from Last 3 Encounters:  01/07/18 206 lb (93.4 kg)  09/28/17 206 lb 6.4 oz (93.6 kg)  03/30/17 202 lb (91.6 kg)   Constitutional: overweight, in NAD Eyes: PERRLA, EOMI, no exophthalmos ENT: moist mucous membranes, no thyromegaly, no cervical lymphadenopathy Cardiovascular: RRR, No MRG Respiratory: CTA B Gastrointestinal: abdomen soft, NT, ND, BS+ Musculoskeletal: no deformities, strength intact in all 4 Skin: moist, warm, no rashes Neurological: no tremor with outstretched hands, DTR 1/3 in all 4  ASSESSMENT: 1. DM2, non-insulin-dependent, uncontrolled, with complications - CKD  Cardiologist: Dr Rennis Golden  2. Obesity class 2 BMI Classification:  < 18.5 underweight   18.5-24.9 normal weight   25.0-29.9 overweight   30.0-34.9 class I obesity   35.0-39.9 class II obesity   ? 40.0 class III obesity   3. HL  PLAN:  1. Patient with long-standing, uncontrolled, type 2 diabetes, on oral medication regimen, and also GLP-1 receptor agonist.  At last visit, she was on  weekly Trulicity but due to price she had to change back to daily Victoza.  Of note, she refused insulin in the past.  At last visit, since she was working on improving her diet (mostly adjusting her eating at night) and starting an exercise regimen (just joined [employer] with her husband), we did not change her regimen.  At this visit, she tells me that she did not continue to go to the gym but started walking with her husband approximately 1.5 months ago.  Sugars improved since then. - An HbA1c calculated from fructosamine was much better in the past compared to her HbA1c directly measured.  In 02/2017, this was 5.9%.  At last visit, I ordered another fructosamine level but she did not stop at the lab.  We will check this again today. - Her sugars are slightly better in the morning and definitely better at night.  She had some very high sugars at night in 09/2017, in the 300s, but now much better, with the only higher blood sugar in the 200s.  We discussed about using the higher dose of glipizide before dinner, 5 mg, unless she eats a very small meal - I suggested to:  Patient Instructions  Please continue: - Metformin 1000 mg 2x a day, with meals - Glipizide ER XL 10 mg in am and 5 mg before dinner - Farxiga 10 mg in am - Victoza 1.8 mg daily  Please return in 3 months with your sugar log.   - today, HbA1c is 9% (still high) - continue checking sugars at different times of the day - check 1x a day, rotating checks - advised for yearly eye exams >> she is not UTD  - we will check a CMP and an ACR today - Return to clinic in 3 mo with sugar log      2. Obesity class 2 -Weight is stable since last visit -Continue with the SGLT2 inhibitor and GLP-1 receptor agonist, which should also help -Continue walking  3. HL - Reviewed latest lipid panel from 2017: All fractions at goal - Continues Lipitor without side effects.   -  We will recheck today (nonfasting, she had yogurt this  morning)  Component     Latest Ref Rng & Units 01/07/2018  Glucose     65 - 99 mg/dL 119 (H)  BUN     7 - 25 mg/dL 18  Creatinine     1.47 - 1.10 mg/dL 8.29  GFR, Est Non African American     > OR = 60 mL/min/1.68m2 71  GFR, Est African American     > OR = 60 mL/min/1.28m2 82  BUN/Creatinine Ratio     6 - 22 (calc) NOT APPLICABLE  Sodium     135 - 146 mmol/L 139  Potassium     3.5 - 5.3 mmol/L 4.6  Chloride     98 - 110 mmol/L 101  CO2     20 - 32 mmol/L 24  Calcium     8.6 - 10.2 mg/dL 9.5  Total Protein     6.1 - 8.1 g/dL 6.7  Albumin MSPROF     3.6 - 5.1 g/dL 4.4  Globulin     1.9 - 3.7 g/dL (calc) 2.3  AG Ratio     1.0 - 2.5 (calc) 1.9  Total Bilirubin     0.2 - 1.2 mg/dL 0.4  Alkaline phosphatase (APISO)     33 - 115 U/L 51  AST     10 - 30 U/L 18  ALT     6 - 29 U/L 15  Cholesterol     0 - 200 mg/dL 562  Triglycerides     0.0 - 149.0 mg/dL 13.0  HDL Cholesterol     >39.00 mg/dL 86.57  VLDL     0.0 - 84.6 mg/dL 96.2  LDL (calc)     0 - 99 mg/dL 61  Total CHOL/HDL Ratio      3  NonHDL      78.61  Microalb, Ur     0.0 - 1.9 mg/dL <9.5  Creatinine,U     mg/dL 28.4  MICROALB/CREAT RATIO     0.0 - 30.0 mg/g 1.0  Hemoglobin A1C     4.0 - 5.6 % 9.0 (A)  Fructosamine     190 - 270 umol/L 266   Labs are OK except high Glu. HbA1c calculated from Fructosamine is 6.1% (great, slightly higher than before!)   Carlus Pavlov, MD PhD [facility]

## 2018-01-07 NOTE — Patient Instructions (Signed)
Please continue: - Metformin 1000 mg 2x a day, with meals - Glipizide ER XL 10 mg in am and 5 mg before dinner - Farxiga 10 mg in am - Victoza 1.8 mg daily  Please return in 3-4 months with your sugar log.

## 2018-01-09 LAB — FRUCTOSAMINE: FRUCTOSAMINE: 266 umol/L (ref 190–270)

## 2018-01-09 LAB — COMPLETE METABOLIC PANEL WITH GFR
AG RATIO: 1.9 (calc) (ref 1.0–2.5)
ALT: 15 U/L (ref 6–29)
AST: 18 U/L (ref 10–30)
Albumin: 4.4 g/dL (ref 3.6–5.1)
Alkaline phosphatase (APISO): 51 U/L (ref 33–115)
BILIRUBIN TOTAL: 0.4 mg/dL (ref 0.2–1.2)
BUN: 18 mg/dL (ref 7–25)
CHLORIDE: 101 mmol/L (ref 98–110)
CO2: 24 mmol/L (ref 20–32)
Calcium: 9.5 mg/dL (ref 8.6–10.2)
Creat: 0.97 mg/dL (ref 0.50–1.10)
GFR, Est African American: 82 mL/min/{1.73_m2} (ref >=60)
GFR, Est Non African American: 71 mL/min/{1.73_m2} (ref >=60)
Globulin: 2.3 g/dL (calc) (ref 1.9–3.7)
Glucose, Bld: 194 mg/dL — ABNORMAL HIGH (ref 65–99)
Potassium: 4.6 mmol/L (ref 3.5–5.3)
Sodium: 139 mmol/L (ref 135–146)
Total Protein: 6.7 g/dL (ref 6.1–8.1)

## 2018-02-10 ENCOUNTER — Other Ambulatory Visit: Payer: Self-pay | Admitting: Internal Medicine

## 2018-03-22 ENCOUNTER — Other Ambulatory Visit: Payer: Self-pay | Admitting: Internal Medicine

## 2018-03-22 DIAGNOSIS — I5022 Chronic systolic (congestive) heart failure: Secondary | ICD-10-CM

## 2018-04-06 ENCOUNTER — Other Ambulatory Visit: Payer: Self-pay | Admitting: Internal Medicine

## 2018-04-08 ENCOUNTER — Telehealth: Payer: Self-pay | Admitting: Internal Medicine

## 2018-04-08 MED ORDER — IVABRADINE HCL 5 MG PO TABS: 5.0000 mg | ORAL_TABLET | Freq: Two times a day (BID) | ORAL | 3 refills | Status: DC

## 2018-04-08 NOTE — Telephone Encounter (Signed)
Spoke with patient concerning interaction with corlanor & fluconazole. She reports she uses fluconazole only PRN when she has yeast infections. Per MD, he would not want her on this long-term.   She has been scheduled for ROV 1 year on 9/20 with MD.  corlanor refilled per request

## 2018-04-12 ENCOUNTER — Encounter: Payer: Self-pay | Admitting: Internal Medicine

## 2018-04-12 ENCOUNTER — Ambulatory Visit (INDEPENDENT_AMBULATORY_CARE_PROVIDER_SITE_OTHER): Payer: 59 | Admitting: Internal Medicine

## 2018-04-12 VITALS — BP 112/68 | HR 86 | Ht 62.0 in | Wt 205.2 lb

## 2018-04-12 DIAGNOSIS — E669 Obesity, unspecified: Secondary | ICD-10-CM | POA: Diagnosis not present

## 2018-04-12 DIAGNOSIS — I428 Other cardiomyopathies: Secondary | ICD-10-CM

## 2018-04-12 DIAGNOSIS — R000 Tachycardia, unspecified: Secondary | ICD-10-CM

## 2018-04-12 DIAGNOSIS — I10 Essential (primary) hypertension: Secondary | ICD-10-CM

## 2018-04-12 MED ORDER — ATORVASTATIN CALCIUM 40 MG PO TABS: 40.0000 mg | ORAL_TABLET | Freq: Every day | ORAL | 3 refills | Status: DC

## 2018-04-12 MED ORDER — HYDROCHLOROTHIAZIDE 25 MG PO TABS: 25.0000 mg | ORAL_TABLET | Freq: Every day | ORAL | 3 refills | Status: DC

## 2018-04-12 MED ORDER — CARVEDILOL 25 MG PO TABS: ORAL_TABLET | ORAL | 3 refills | Status: DC

## 2018-04-12 MED ORDER — RAMIPRIL 5 MG PO CAPS: ORAL_CAPSULE | ORAL | 3 refills | Status: DC

## 2018-04-12 MED ORDER — SPIRONOLACTONE 25 MG PO TABS: 12.5000 mg | ORAL_TABLET | Freq: Every day | ORAL | 3 refills | Status: DC

## 2018-04-12 NOTE — Patient Instructions (Signed)
Your physician wants you to follow-up in: ONE YEAR with Dr. Hilty. You will receive a reminder letter in the mail two months in advance. If you don't receive a letter, please call our office to schedule the follow-up appointment.  

## 2018-04-12 NOTE — Progress Notes (Signed)
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 In 2011, her EF was 45% to 50% by echocardiogram with medical therapy. Unfortunately, after her initial diagnosis, she developed a pulmonary embolus in 2003 and was on Coumadin for about 2 years, and now is on aspirin 325 mg daily. She also has diabetes, hypertension, and obesity. She recently she tripped in her house and developed a small stress fracture and sprain of her left ankle which she has recovered from. She denies any shortness of breath with exertion, chest pain, palpitations, presyncope, syncopal symptoms.  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 She continues to feel at baseline. Interestingly, her heart rate is lower than it was at her last office visit.  Laurie Small returns today for followup. She reports doing well denies any shortness of breath or chest pain with exertion. Unfortunately she's not been able to lose any significant amount of weight. She has had some adjustments in her diabetes medicine with the addition of glipizide, she is also taking Onglyza and metformin 1000 mg twice daily.  She is interested in possibly getting off of some of her medications.  Saw Laurie Small back in the office today. Overall she is doing well without any symptoms of heart failure. Blood sugar has been better controlled. Weight appears to be down just a few pounds. EKG looks normal today. Her last echocardiogram however did not show significant improvement in EF, therefore I recommended we remain on her current heart failure medications. Currently she has NYHA class I symptoms.  04/28/2016  Laurie Small returns today for follow-up. In the past year she's done fairly well. She denies any chest pain or worsening shortness of breath. Unfortunately she's been struggling with left plantar fasciitis and she is in a walking boot today. She's actually lost 46 pounds since we last saw her. She continues to have class I heart failure symptoms. Interestingly she is tachycardic today. Initially this was thought to be due to her rushing in the office however her heart rate remained elevated even at the end of the visit. She reports compliance with her beta blocker which is actually a high dose. I don't know if this is related to worsening heart failure with sympathetic activation. She was supposed to have an echo a year ago but we did not obtain that and I like to reassess LV function since his been 2 years since her last study. Her last EF was 40-45%. Stress new heart failure treatments that are available today including Entresto and Corlanor - which may be of some benefit.  06/21/2016  Laurie Small was seen in follow-up today. A repeat echo still shows her LVEF to be around 40%. She remains persistently tachycardiac with HR in the upper 90's and low 100's. This appears to be sinus. Blood pressure runs in the 110-120 systolic range and there is little room to increase her carvedilol which is already at 25 mg BID. She was on digoxin, but it did not seem to affect her heartrate, so I discontinued it. She may be a candidate for Corlanor. A big question is whether she has persistent, inappropriate sinus tachycardia and whether this is contributing to her cardiomyopathy.  07/28/2016  Laurie Small returns today for follow-up. Her heart rate remains in the upper 90s. Her monitor showed heart rates between the 70s and 130s, however she is persistently in the low 90s and 100s. Blood pressure is a little higher today and she may tolerate an increase in her carvedilol. We discussed adding Corlanor, however she is not  interested this time due to cost issues.  08/29/2016  Laurie Small returns for follow-up. I increased her carvedilol and blood pressure now is more in the 110-120 systolic range. Heart rate remains elevated in the 80s and 90s. This is actually on except behind given her low ejection fraction of 40%.  I discussed this with Dr. Gala Romney and although EF is not less than 35%, there is probably good reason to consider trying it for HR improvement.  09/28/2016  Laurie Small returns today for follow-up. Interestingly her heart rate has improved in general on Ivabradine. It appears from her home measurements that her heart rate is somewhere around 70. She is on the 5 mg twice a day dosing. The only side effect she notices are the visual disturbances which seems to be worse in the morning but gets better fairly quickly.  03/30/2017  Laurie Small was seen today in follow-up. She seems to noted an improvement in her heart rate on Ivabradine. Heart rate today is 67 and in general she's not had any significant tachycardia or inappropriate increases in heart rate with minimal exertion. In addition she is on carvedilol. She denies any chest pain. She occasionally has some visual disturbances in the morning however it seems to be better as the day goes on and not associated to the dose of medicine.  04/12/2018  Laurie Small returns today for follow-up.  Overall she continues to do really well.  She is tolerated ivabradine with improvement in heart rate.  Her last echo showed her EF up to 50 to 55% as of October 2018.  She is asymptomatic with NYHA class I symptoms.  Blood pressure is well controlled today.  PMHx:  Past Medical History:  Diagnosis Date  . Depression   . NICM (nonischemic cardiomyopathy) (HCC)    2D ECHO, 12/26/2011 - EF 45-50%, left ventricle borderline dilated    Past Surgical History:  Procedure Laterality Date  . CARDIAC CATHETERIZATION  01/06/2002   Dilated nonsichemic  cardiopmyopathy, EF 28%, at least 2+ angiographic mitral regurgitation    FAMHx:  Family History  Problem Relation Age of Onset  . Hypertension Mother   . GI Bleed Mother   . Pneumonia Maternal Grandmother   . Cancer Paternal Grandmother        Breast cancer  . Breast cancer Paternal Grandmother     SOCHx:   reports that she has never smoked. She has never used smokeless tobacco. She reports that she does not drink alcohol or use drugs.  ALLERGIES:  Allergies  Allergen Reactions  . Betadine [Povidone Iodine]   . Iodine   . Peanuts [Peanut Oil]   . Shellfish Allergy   . Other Hives and Rash    Sunlight and Grass     ROS: Pertinent items noted in HPI and remainder of comprehensive ROS otherwise negative.  HOME MEDS: Current Outpatient Medications  Medication Sig Dispense Refill  . allopurinol (ZYLOPRIM) 100 MG tablet Take 1 tablet by mouth daily.    Marland Kitchen aspirin 325 MG tablet Take 325 mg by mouth daily.    Marland Kitchen atorvastatin (LIPITOR) 40 MG tablet Take 1 tablet (  40 mg total) by mouth daily at 6 PM. 90 tablet 3  . carvedilol (COREG) 25 MG tablet TAKE 1 TABLET (25 MG TOTAL) BY MOUTH 2 (TWO) TIMES DAILY WITH A MEAL. 180 tablet 3  . colchicine 0.6 MG tablet Take 1 tablet (0.6 mg total) by mouth daily. 30 tablet 1  . dapagliflozin propanediol (FARXIGA) 10 MG TABS tablet Take 10 mg by mouth daily. 90 tablet 3  . diphenhydrAMINE (BENADRYL) 25 MG tablet Take 25 mg by mouth daily.    Marland Kitchen glipiZIDE (GLUCOTROL XL) 5 MG 24 hr tablet TAKE 2 TABLETS BY MOUTH BEFORE BREAKFAST AND 1/2 TABLET BY MOUTH BEFORE DINNER 215 tablet 3  . hydrochlorothiazide (HYDRODIURIL) 25 MG tablet Take 1 tablet (25 mg total) by mouth daily. 90 tablet 3  . hydrocortisone 2.5 % cream as needed.    . Insulin Pen Needle (BD PEN NEEDLE MICRO U/F) 32G X 6 MM MISC Use with Victoza pen daily 100 each 2  . ivabradine (CORLANOR) 5 MG TABS tablet Take 1 tablet (5 mg total) by mouth 2 (two) times daily with a meal. 180 tablet 3    . liraglutide (VICTOZA) 18 MG/3ML SOPN Inject 0.3 mLs (1.8 mg total) into the skin daily. 9 pen 3  . metFORMIN (GLUCOPHAGE) 1000 MG tablet Take 1 tablet (1,000 mg total) by mouth 2 (two) times daily with a meal. 180 tablet 3  . predniSONE (DELTASONE) 5 MG tablet Take 5 mg by mouth as needed.    . ramipril (ALTACE) 5 MG capsule TAKE 2 CAPSULES BY MOUTH IN THE MORNING AND 1 CAPSULE BY MOUTH IN THE EVENING 270 capsule 0  . spironolactone (ALDACTONE) 25 MG tablet TAKE 0.5 TABLETS (12.5 MG TOTAL) BY MOUTH DAILY. 45 tablet 1   No current facility-administered medications for this visit.     LABS/IMAGING: No results found for this or any previous visit (from the past 48 hour(s)). No results found.  VITALS: BP 112/68   Pulse 86   Ht 5\' 2"  (1.575 m)   Wt 205 lb 3.2 oz (93.1 kg)   BMI 37.53 kg/m   EXAM: General appearance: alert and no distress Neck: no carotid bruit, no JVD and thyroid not enlarged, symmetric, no tenderness/mass/nodules Lungs: clear to auscultation bilaterally Heart: regular rate and rhythm, S1, S2 normal, no murmur, click, rub or gallop Abdomen: soft, non-tender; bowel sounds normal; no masses,  no organomegaly and obese Extremities: extremities normal, atraumatic, no cyanosis or edema Pulses: 2+ and symmetric Skin: Skin color, texture, turgor normal. No rashes or lesions Neurologic: Grossly normal Psych: Pleasant  EKG: Normal sinus rhythm 86, poor R wave progression anterolaterally-personally reviewed  ASSESSMENT: 1. Nonischemic cardiomyopathy, EF 40-45% -> 50-55% (04/2017) - NYHA class I symptoms 2. Inappropriate sinus tachycardia 3. History of pulmonary embolism 4. Diabetes type 2 - controlled 5. Hypertension - controlled 6. Obesity  PLAN: 1.    Ms. Small has had improvement in her cardiomyopathy with addition of ivabradine and her inappropriate sinus tachycardia has improved.  She now runs a consistent heart rate in the 80s and has no significant  side effects from the medication.  Overall she is doing well we will continue current medications likely lifelong.  Follow-up annually or sooner as necessary.  Chrystie Nose, MD, [facility], FACP    [facility]  Medical Director of the [facility] Diplomate of the American Board of Clinical Lipidology Attending Cardiologist  Direct Dial: [phone]  Fax: [phone]  Website:  [email or web address] 04/12/2018, 3:16 PM

## 2018-05-20 ENCOUNTER — Encounter: Payer: Self-pay | Admitting: Internal Medicine

## 2018-05-20 ENCOUNTER — Ambulatory Visit (INDEPENDENT_AMBULATORY_CARE_PROVIDER_SITE_OTHER): Payer: 59 | Admitting: Internal Medicine

## 2018-05-20 VITALS — BP 118/70 | HR 65 | Ht 62.0 in | Wt 206.0 lb

## 2018-05-20 DIAGNOSIS — R809 Proteinuria, unspecified: Secondary | ICD-10-CM | POA: Diagnosis not present

## 2018-05-20 DIAGNOSIS — E785 Hyperlipidemia, unspecified: Secondary | ICD-10-CM | POA: Diagnosis not present

## 2018-05-20 DIAGNOSIS — Z23 Encounter for immunization: Secondary | ICD-10-CM | POA: Diagnosis not present

## 2018-05-20 DIAGNOSIS — E669 Obesity, unspecified: Secondary | ICD-10-CM | POA: Diagnosis not present

## 2018-05-20 DIAGNOSIS — E1129 Type 2 diabetes mellitus with other diabetic kidney complication: Secondary | ICD-10-CM

## 2018-05-20 LAB — POCT GLYCOSYLATED HEMOGLOBIN (HGB A1C): Hemoglobin A1C: 8.1 % — AB (ref 4.0–5.6)

## 2018-05-20 NOTE — Progress Notes (Addendum)
Patient ID: Laurie Small, female   DOB: [birth year], 42 y.o.   MRN: [id number]  HPI: Laurie Small is a 42 y.o.-year-old female, returning for f/u for DM2, dx in 2003 (after an episode of myocarditis), non-insulin-dependent, uncontrolled, with complications (microalbuminuria). Last visit 3 months ago. PCP: Dr. Hoyt Small >> left practice that she is looking for a new PCP.  She and her husband walk 2.5 miles 4x a week.  Last hemoglobin A1c was: 01/07/2018: HbA1c calculated from fructosamine is 6.1% (great, slightly higher than before!) 03/22/2017: HbA1c calculated from fructosamine is 5.9%!  12/14/2016: HbA1c calculated from fructosamine is 6.6%. 09/14/2016: HbA1c calculated from fructosamine is MUCH lower, at 6.8%! Lab Results  Component Value Date   HGBA1C 9.0 (A) 01/07/2018   HGBA1C 9.3 09/28/2017   HGBA1C 8.4 03/22/2017  05/31/2015: HbA1c 10% 09/15/2014: HbA1c 9.6% 04/23/2014: HbA1c 10.9% 01/20/2014: HbA1c 12.9%  Pt is on a regimen of: - Metformin 1000 mg 2x a day, with meals - Glipizide ER XL 10 mg in am and 5-10 mg before dinner - Farxiga 10 mg in am - Victoza 1.8 mg daily She was on Onglyza (too expensive).  She was on JanuMet (stopped being effective), then Januvia >> stopped when starting Trulicity She was on Victoza (trial period). She refused insulin.  Pt checks her sugars 1-2 times a day: - am: 91-180, 204 >> 79-171, 190 >> 95-148, 159, 171 (no Victoza) - 2h after b'fast:n/c >> 115-205, 267 >> n/c - before lunch:176 >> n/c >> 126 >> n/c - 2h after lunch: 136 >> n/c >> 165 >> n/c >> 189, 194 - before dinner: n/c >> 136-205 >> n/c  - 2h after dinner: 159 >> n/c >> 134 - bedtime: n/c >> 115-193, 206 >> n/c - nighttime: 85-142, 195 >> 73, 75, 214 >> n/c Lowest sugar was 79 >> 95; it is unclear at which level she has hypoglycemia awareness Highest sugar was 206 >> 251.  Glucometer: FreeStyle Ultra mini >> Relion  Pt's meals are: - Breakfast: fruit,  yoghurt, oatmeal, eggs, grits, bacon - Lunch: pasta, sandwich, salad - Dinner: meat + veggies - Snacks: 3 She works from home. She works from 5 am to 1 pm.  -+ CKD, last BUN/creatinine:  Lab Results  Component Value Date   BUN 18 01/07/2018   CREATININE 0.97 01/07/2018   Lab Results  Component Value Date   GFRAA 82 01/07/2018    No recent MAU: Lab Results  Component Value Date   MICRALBCREAT 1.0 01/07/2018   ACR  (09/15/2014): 44.4 ACR (01/20/2014): 79.9 On Ramipril.  - + HL; last set of lipids reviewed: Lab Results  Component Value Date   CHOL 128 01/07/2018   HDL 49.60 01/07/2018   LDLCALC 61 01/07/2018   TRIG 90.0 01/07/2018   CHOLHDL 3 01/07/2018  03/31/2016: 136/69/49/73 06/12/2014: 147/118/44/80 On Lipitor.  - last eye exam was in 2018: No DR  - no numbness and tingling in her feet. + Heel spurs.  She also has a history of HTN, PE; she has had parathyroid surgery in 2001.  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 She stopped Digoxin >> on Corlanor now. 2D echo by her cardiologist >> EF improved to 50 to 55%..  ROS: Constitutional: no weight gain/no weight loss, no fatigue, no subjective hyperthermia, no subjective hypothermia Eyes: no blurry vision, no xerophthalmia ENT: no sore throat, no nodules palpated in neck, no dysphagia, no odynophagia, no hoarseness Cardiovascular: no CP/no SOB/no palpitations/no leg swelling Respiratory: no  cough/no SOB/no wheezing Gastrointestinal: no N/no V/no D/no C/no acid reflux Musculoskeletal: no muscle aches/no joint aches Skin: no rashes, no hair loss Neurological: no tremors/no numbness/no tingling/no dizziness  I reviewed pt's medications, allergies, PMH, social hx, family hx, and changes were documented in the history of present illness. Otherwise, unchanged from my initial visit note.  Past Medical History:  Diagnosis Date  . Depression   . NICM (nonischemic cardiomyopathy) (HCC)    2D ECHO, 12/26/2011 - EF  45-50%, left ventricle borderline dilated   Past Surgical History:  Procedure Laterality Date  . CARDIAC CATHETERIZATION  01/06/2002   Dilated nonsichemic cardiopmyopathy, EF 28%, at least 2+ angiographic mitral regurgitation   Social History   Social History  . Marital Status: Married    Spouse Name: N/A  . Number of Children: 1   Occupational History  .  claims benefits specialist    Social History Main Topics  . Smoking status: Never Smoker   . Smokeless tobacco: Not on file  . Alcohol Use: No  . Drug Use: No   Current Outpatient Medications on File Prior to Visit  Medication Sig Dispense Refill  . allopurinol (ZYLOPRIM) 100 MG tablet Take 1 tablet by mouth daily.    Marland Kitchen aspirin 325 MG tablet Take 325 mg by mouth daily.    Marland Kitchen atorvastatin (LIPITOR) 40 MG tablet Take 1 tablet (40 mg total) by mouth daily at 6 PM. 90 tablet 3  . carvedilol (COREG) 25 MG tablet TAKE 1 TABLET (25 MG TOTAL) BY MOUTH 2 (TWO) TIMES DAILY WITH A MEAL. 180 tablet 3  . colchicine 0.6 MG tablet Take 1 tablet (0.6 mg total) by mouth daily. 30 tablet 1  . dapagliflozin propanediol (FARXIGA) 10 MG TABS tablet Take 10 mg by mouth daily. 90 tablet 3  . diphenhydrAMINE (BENADRYL) 25 MG tablet Take 25 mg by mouth daily.    Marland Kitchen glipiZIDE (GLUCOTROL XL) 5 MG 24 hr tablet TAKE 2 TABLETS BY MOUTH BEFORE BREAKFAST AND 1/2 TABLET BY MOUTH BEFORE DINNER 215 tablet 3  . hydrochlorothiazide (HYDRODIURIL) 25 MG tablet Take 1 tablet (25 mg total) by mouth daily. 90 tablet 3  . hydrocortisone 2.5 % cream as needed.    . Insulin Pen Needle (BD PEN NEEDLE MICRO U/F) 32G X 6 MM MISC Use with Victoza pen daily 100 each 2  . ivabradine (CORLANOR) 5 MG TABS tablet Take 1 tablet (5 mg total) by mouth 2 (two) times daily with a meal. 180 tablet 3  . liraglutide (VICTOZA) 18 MG/3ML SOPN Inject 0.3 mLs (1.8 mg total) into the skin daily. 9 pen 3  . metFORMIN (GLUCOPHAGE) 1000 MG tablet Take 1 tablet (1,000 mg total) by mouth 2 (two)  times daily with a meal. 180 tablet 3  . predniSONE (DELTASONE) 5 MG tablet Take 5 mg by mouth as needed.    . ramipril (ALTACE) 5 MG capsule Take 2 capsules by mouth in the morning and 1 capsule in the evening. 270 capsule 3  . spironolactone (ALDACTONE) 25 MG tablet Take 0.5 tablets (12.5 mg total) by mouth daily. 45 tablet 3   No current facility-administered medications on file prior to visit.    Allergies  Allergen Reactions  . Betadine [Povidone Iodine]   . Iodine   . Peanuts [Peanut Oil]   . Shellfish Allergy   . Other Hives and Rash    Sunlight and Grass    Family History  Problem Relation Age of Onset  . Hypertension Mother   .  GI Bleed Mother   . Pneumonia Maternal Grandmother   . Cancer Paternal Grandmother        Breast cancer  . Breast cancer Paternal Grandmother    PE: BP 118/70   Pulse 65   Ht 5\' 2"  (1.575 m) Comment: measured  Wt 206 lb (93.4 kg)   SpO2 98%   BMI 37.68 kg/m  Body mass index is 37.68 kg/m. Wt Readings from Last 3 Encounters:  05/20/18 206 lb (93.4 kg)  04/12/18 205 lb 3.2 oz (93.1 kg)  01/07/18 206 lb (93.4 kg)   Constitutional: overweight, in NAD Eyes: PERRLA, EOMI, no exophthalmos ENT: moist mucous membranes, no thyromegaly, no cervical lymphadenopathy Cardiovascular: RRR, No MRG Respiratory: CTA B Gastrointestinal: abdomen soft, NT, ND, BS+ Musculoskeletal: no deformities, strength intact in all 4 Skin: moist, warm, no rashes Neurological: no tremor with outstretched hands, DTR normal in all 4  ASSESSMENT: 1. DM2, non-insulin-dependent, uncontrolled, with complications - CKD  Cardiologist: Dr Rennis Golden  2. Obesity class 2 BMI Classification:  < 18.5 underweight   18.5-24.9 normal weight   25.0-29.9 overweight   30.0-34.9 class I obesity   35.0-39.9 class II obesity   ? 40.0 class III obesity   3. HL  PLAN:  1. Patient with long-standing, uncontrolled, type 2 diabetes, on oral medication regimen and also GLP-1  receptor agonist (now Victoza, changed from Trulicity per insurance preference).  Of note, she refused insulin in the past.  - At last visit, and HbA1c calculated from fructosamine has improved, 6.1%.  Of note, the directly measured HbA1c is not very accurate for her. - At last visit, her sugars were slightly better in the morning and definitely better at night.  She had some very high blood sugars at night, and 300s this spring, but then they improved to the 200s.  We discussed about using the higher dose of glipizide before dinner, 5 mg, unless she eats a very small meal.  At this visit she tells me that she may use a 5 or 10 mg glipizide depending on the size of her meal.   - Sugars have improved in the last month after increasing the distance walked with her husband 4 times a week.  She is now walking 2.5 miles.  Sugars in the morning are much better.  She now only have spikes above target. - We will not change her regimen now but discussed about the need to continue walking - I suggested to:  Patient Instructions  Please continue: - Metformin 1000 mg 2x a day, with meals - Glipizide ER XL 10 mg in am and 5 mg before dinner - Farxiga 10 mg in am - Victoza 1.8 mg daily  Please return in 4 months with your sugar log.   - today, HbA1c is 8.1% (decreased, however, for her, fructosamine levels are more accurate) - we will check a fructosamine level now. - continue checking sugars at different times of the day - check 1x a day, rotating checks - advised for yearly eye exams >> she is not UTD - + Flu shot today - Return to clinic in 4 mo with sugar log       2. Obesity class 2 -Continue SGLT2 inhibitor and GLP-1 receptor agonist, which should also help with weight loss -Continue walking  3. HL - Reviewed latest lipid panel from 12/2017: All fractions at goal Lab Results  Component Value Date   CHOL 128 01/07/2018   HDL 49.60 01/07/2018   LDLCALC 61 01/07/2018  TRIG 90.0 01/07/2018    CHOLHDL 3 01/07/2018  - Continues Lipitor without side effects.  Office Visit on 05/20/2018  Component Date Value Ref Range Status  . Fructosamine 05/20/2018 274  205 - 285 umol/L Final  . Hemoglobin A1C 05/20/2018 8.1* 4.0 - 5.6 % Final    HbA1c calculated from the fructosamine is 6.27%, still excellent.  Carlus Pavlov, MD PhD [facility]

## 2018-05-20 NOTE — Addendum Note (Signed)
Addended by: Darliss Ridgel I on: 05/20/2018 10:56 AM   Modules accepted: Orders

## 2018-05-20 NOTE — Patient Instructions (Addendum)
Please continue: - Metformin 1000 mg 2x a day, with meals - Glipizide ER XL 10 mg in am and 5-10 mg before dinner - Farxiga 10 mg in am - Victoza 1.8 mg daily  Please return in 4 months with your sugar log.

## 2018-05-23 LAB — FRUCTOSAMINE: Fructosamine: 274 umol/L (ref 205–285)

## 2018-05-30 ENCOUNTER — Other Ambulatory Visit: Payer: Self-pay | Admitting: Internal Medicine

## 2018-05-31 NOTE — Telephone Encounter (Signed)
Rx request sent to pharmacy.  

## 2018-07-01 ENCOUNTER — Other Ambulatory Visit (HOSPITAL_COMMUNITY)
Admission: RE | Admit: 2018-07-01 | Discharge: 2018-07-01 | Disposition: A | Discharge status: Home / Self Care | Payer: 59 | Source: Ambulatory Visit | Attending: Family Medicine | Admitting: Family Medicine

## 2018-07-01 ENCOUNTER — Other Ambulatory Visit: Payer: Self-pay | Admitting: Family Medicine

## 2018-07-01 DIAGNOSIS — Z0000 Encounter for general adult medical examination without abnormal findings: Secondary | ICD-10-CM | POA: Insufficient documentation

## 2018-07-02 LAB — CYTOLOGY - PAP
DIAGNOSIS: NEGATIVE
HPV: NOT DETECTED

## 2018-08-14 ENCOUNTER — Other Ambulatory Visit: Payer: Self-pay | Admitting: Internal Medicine

## 2018-08-26 ENCOUNTER — Other Ambulatory Visit: Payer: Self-pay | Admitting: Family Medicine

## 2018-08-26 DIAGNOSIS — Z1231 Encounter for screening mammogram for malignant neoplasm of breast: Secondary | ICD-10-CM

## 2018-09-17 ENCOUNTER — Ambulatory Visit (INDEPENDENT_AMBULATORY_CARE_PROVIDER_SITE_OTHER): Payer: 59 | Admitting: Internal Medicine

## 2018-09-17 ENCOUNTER — Encounter: Payer: Self-pay | Admitting: Internal Medicine

## 2018-09-17 VITALS — BP 140/80 | HR 87 | Ht 62.0 in | Wt 203.0 lb

## 2018-09-17 DIAGNOSIS — R809 Proteinuria, unspecified: Secondary | ICD-10-CM

## 2018-09-17 DIAGNOSIS — E1129 Type 2 diabetes mellitus with other diabetic kidney complication: Secondary | ICD-10-CM | POA: Diagnosis not present

## 2018-09-17 DIAGNOSIS — E669 Obesity, unspecified: Secondary | ICD-10-CM | POA: Diagnosis not present

## 2018-09-17 DIAGNOSIS — E785 Hyperlipidemia, unspecified: Secondary | ICD-10-CM

## 2018-09-17 MED ORDER — SEMAGLUTIDE(0.25 OR 0.5MG/DOS) 2 MG/1.5ML ~~LOC~~ SOPN: 0.5000 mg | PEN_INJECTOR | SUBCUTANEOUS | 5 refills | Status: DC

## 2018-09-17 NOTE — Patient Instructions (Addendum)
Please continue: - Metformin 1000 mg 2x a day, with meals - Glipizide ER XL 10 mg in a.m. and 5 mg before dinner - Farxiga 10 mg in am - Victoza 1.8 mg daily  When you finish Victoza, start Ozempic 0.5 mg weekly, Sunday mornings.  Please return in 4 months with your sugar log.

## 2018-09-17 NOTE — Progress Notes (Signed)
Patient ID: Laurie Small, female   DOB: 05-13-1976, 43 y.o.   MRN: [id number]  HPI: Laurie Small is a 43 y.o.-year-old female, returning for f/u for DM2, dx in 2003 (after an episode of myocarditis), non-insulin-dependent, uncontrolled, with complications (microalbuminuria). Last visit 4 months ago. PCP: Dr. Hoyt Koch >> now Dr. Tracie Harrier.  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  She works from home, starting at 5 AM until 1 PM.  Before last visit, she started to walk with her husband 4 times a week: 2.5 miles >> this greatly helped her sugars.  She is not walking now 2/2 cold weather.  She was found to be anemic during recent investigation by PCP.  Last hemoglobin A1c was: 05/20/2018: HbA1c calculated from the fructosamine is 6.27%, still excellent. 01/07/2018: HbA1c calculated from fructosamine is 6.1% (great, slightly higher than before!) 03/22/2017: HbA1c calculated from fructosamine is 5.9%!  12/14/2016: HbA1c calculated from fructosamine is 6.6%. 09/14/2016: HbA1c calculated from fructosamine is MUCH lower, at 6.8%! Lab Results  Component Value Date   HGBA1C 8.1 (A) 05/20/2018   HGBA1C 9.0 (A) 01/07/2018   HGBA1C 9.3 09/28/2017  05/31/2015: HbA1c 10% 09/15/2014: HbA1c 9.6% 04/23/2014: HbA1c 10.9% 01/20/2014: HbA1c 12.9%  Pt is on a regimen of: - Metformin 1000 mg 2x a day, with meals - Glipizide ER XL 10 mg in a.m. and 5 mg before dinner - Farxiga 10 mg in am >> free now - Victoza 1.8 mg daily >> not covered anymore; and neither is Trulicity She was on Onglyza (too expensive).  She was on JanuMet (stopped being effective), then Januvia >> stopped when starting Trulicity She was on Victoza (trial period). She refused insulin.  Pt checks her sugars 1-2 times a day: - am:  79-171, 190 >> 95-148, 159, 171 (no Victoza) >> 83, 104-256 (high CBGs if she forgets Victoza) - 2h after b'fast:n/c >> 115-205, 267 >>  n/c - before lunch:176 >> n/c >> 126 >> n/c - 2h after lunch: 136 >> n/c >> 165 >> n/c >> 189, 194 >> 139 - before dinner: n/c >> 136-205 >> n/c  - 2h after dinner: 159 >> n/c >> 134 - bedtime: n/c >> 115-193, 206 >> n/c - nighttime: 85-142, 195 >> 73, 75, 214 >> n/c Lowest sugar was 79 >> 95 >> 83; it is unclear at which level she has hypoglycemia awareness Highest sugar was 206 >> 251 >> 256.  Glucometer: FreeStyle Ultra mini >> Relion  Pt's meals are: - Breakfast: fruit, yoghurt, oatmeal, eggs, grits, bacon - Lunch: pasta, sandwich, salad - Dinner: meat + veggies - Snacks: 3 She works from home. She works from 5 am to 1 pm.  -+ History of CKD, last BUN/creatinine:  Lab Results  Component Value Date   BUN 18 01/07/2018   CREATININE 0.97 01/07/2018   Lab Results  Component Value Date   GFRAA 82 01/07/2018    No recent MAU: Lab Results  Component Value Date   MICRALBCREAT 1.0 01/07/2018   ACR  (09/15/2014): 44.4 ACR (01/20/2014): 79.9 On ramipril  - + HL; last set of lipids reviewed: Lab Results  Component Value Date   CHOL 128 01/07/2018   HDL 49.60 01/07/2018   LDLCALC 61 01/07/2018   TRIG 90.0 01/07/2018   CHOLHDL 3 01/07/2018  03/31/2016: 136/69/49/73 06/12/2014: 147/118/44/80 On Lipitor 40.  - last eye exam was on 08/05/2018: No DR  -No numbness and tingling in her feet.  +  Heel spurs.  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 She stopped Digoxin >> on Corlanor now. 2D echo by her cardiologist >> EF improved to 50 to 55%..  ROS: Constitutional: no weight gain/no weight loss, no fatigue, no subjective hyperthermia, no subjective hypothermia Eyes: no blurry vision, no xerophthalmia ENT: no sore throat, no nodules palpated in neck, no dysphagia, no odynophagia, no hoarseness Cardiovascular: no CP/no SOB/no palpitations/no leg swelling Respiratory: no cough/no SOB/no  wheezing Gastrointestinal: no N/no V/no D/no C/no acid reflux Musculoskeletal: no muscle aches/no joint aches Skin: no rashes, no hair loss Neurological: no tremors/no numbness/no tingling/no dizziness  I reviewed pt's medications, allergies, PMH, social hx, family hx, and changes were documented in the history of present illness. Otherwise, unchanged from my initial visit note.  Past Medical History:  Diagnosis Date  . Depression   . NICM (nonischemic cardiomyopathy) (HCC)    2D ECHO, 12/26/2011 - EF 45-50%, left ventricle borderline dilated   Past Surgical History:  Procedure Laterality Date  . CARDIAC CATHETERIZATION  01/06/2002   Dilated nonsichemic cardiopmyopathy, EF 28%, at least 2+ angiographic mitral regurgitation   Social History   Social History  . Marital Status: Married    Spouse Name: N/A  . Number of Children: 1   Occupational History  .  claims benefits specialist    Social History Main Topics  . Smoking status: Never Smoker   . Smokeless tobacco: Not on file  . Alcohol Use: No  . Drug Use: No   Current Outpatient Medications on File Prior to Visit  Medication Sig Dispense Refill  . allopurinol (ZYLOPRIM) 100 MG tablet Take 1 tablet by mouth daily.    Marland Kitchen aspirin 325 MG tablet Take 325 mg by mouth daily.    Marland Kitchen atorvastatin (LIPITOR) 40 MG tablet Take 1 tablet (40 mg total) by mouth daily at 6 PM. 90 tablet 3  . carvedilol (COREG) 25 MG tablet TAKE 1 TABLET (25 MG TOTAL) BY MOUTH 2 (TWO) TIMES DAILY WITH A MEAL. 180 tablet 3  . carvedilol (COREG) 25 MG tablet TAKE 1.5 TABLETS BY MOUTH 2 TIMES DAILY WITH A MEAL. 270 tablet 3  . colchicine 0.6 MG tablet Take 1 tablet (0.6 mg total) by mouth daily. 30 tablet 1  . dapagliflozin propanediol (FARXIGA) 10 MG TABS tablet Take 10 mg by mouth daily. 90 tablet 3  . diphenhydrAMINE (BENADRYL) 25 MG tablet Take 25 mg by mouth daily.    Marland Kitchen glipiZIDE (GLUCOTROL XL) 5 MG 24 hr tablet TAKE 2 TABLETS BY MOUTH BEFORE BREAKFAST AND  1/2 TABLET BY MOUTH BEFORE DINNER 225 tablet 3  . hydrochlorothiazide (HYDRODIURIL) 25 MG tablet Take 1 tablet (25 mg total) by mouth daily. 90 tablet 3  . hydrocortisone 2.5 % cream as needed.    . Insulin Pen Needle (BD PEN NEEDLE MICRO U/F) 32G X 6 MM MISC Use with Victoza pen daily 100 each 2  . ivabradine (CORLANOR) 5 MG TABS tablet Take 1 tablet (5 mg total) by mouth 2 (two) times daily with a meal. 180 tablet 3  . liraglutide (VICTOZA) 18 MG/3ML SOPN Inject 0.3 mLs (1.8 mg total) into the skin daily. 9 pen 3  . metFORMIN (GLUCOPHAGE) 1000 MG tablet Take 1 tablet (1,000 mg total) by mouth 2 (two) times daily with a meal. 180 tablet 3  . predniSONE (DELTASONE) 5 MG tablet Take 5 mg by mouth as  needed.    . ramipril (ALTACE) 5 MG capsule Take 2 capsules by mouth in the morning and 1 capsule in the evening. 270 capsule 3  . spironolactone (ALDACTONE) 25 MG tablet Take 0.5 tablets (12.5 mg total) by mouth daily. 45 tablet 3   No current facility-administered medications on file prior to visit.    Allergies  Allergen Reactions  . Betadine [Povidone Iodine]   . Iodine   . Peanuts [Peanut Oil]   . Shellfish Allergy   . Other Hives and Rash    Sunlight and Grass    Family History  Problem Relation Age of Onset  . Hypertension Mother   . GI Bleed Mother   . Pneumonia Maternal Grandmother   . Cancer Paternal Grandmother        Breast cancer  . Breast cancer Paternal Grandmother    PE: BP 140/80   Pulse 87   Ht 5\' 2"  (1.575 m)   Wt 203 lb (92.1 kg)   SpO2 99%   BMI 37.13 kg/m  Body mass index is 37.13 kg/m. Wt Readings from Last 3 Encounters:  09/17/18 203 lb (92.1 kg)  05/20/18 206 lb (93.4 kg)  04/12/18 205 lb 3.2 oz (93.1 kg)   Constitutional: overweight, in NAD Eyes: PERRLA, EOMI, no exophthalmos ENT: moist mucous membranes, no thyromegaly, no cervical lymphadenopathy Cardiovascular: RRR, No MRG Respiratory: CTA B Gastrointestinal: abdomen soft, NT, ND,  BS+ Musculoskeletal: no deformities, strength intact in all 4 Skin: moist, warm, no rashes Neurological: no tremor with outstretched hands, DTR normal in all 4  ASSESSMENT: 1. DM2, non-insulin-dependent, uncontrolled, with complications - CKD  Cardiologist: Dr Rennis Golden  2. Obesity class 2 BMI Classification:  < 18.5 underweight   18.5-24.9 normal weight   25.0-29.9 overweight   30.0-34.9 class I obesity   35.0-39.9 class II obesity   ? 40.0 class III obesity   3. HL  PLAN:  1. Patient with longstanding, uncontrolled, type 2 diabetes, on oral antidiabetic regimen and also daily GLP-1 receptor agonist.  Insurance dictated the change from Trulicity to Victoza.  Of note, she refused insulin in the past.  At last visit, HbA1c was slightly higher, but still controlled, at 6.27%.  At that time, sugars are slightly better in the morning and definitely better at night.  She still had some high blood sugars at night, but improved to the 200s from 300s in the past.  We discussed about increasing glipizide depending on the size of her meal.  Sugars were better in the months prior to our last appointment after she started to walk consistently.  We did not change her regimen at that time. -At this visit, sugars are higher, due to stress and also stopping walking along with possible dietary indiscretions -Her SGL 2 inhibitor is free, however, the GLP-1 receptor agonist is not covered anymore.  She checked with her insurance and neither Victoza nor Trulicity are covered.  We discussed that she is doing well on this and I would like her to try Ozempic.  Given coupon card.  She tells me she feels full with Victoza, but not nauseated.  We will start a conservative dose of Ozempic, 0.5 mg weekly, and we may increase this at next visit if sugars are not at goal. -I also advised her to restart walking. -Otherwise, we will not change her regimen for now. - I suggested to:  Patient Instructions  Please  continue: - Metformin 1000 mg 2x a day, with meals - Glipizide ER  XL 10 mg in a.m. and 5 mg before dinner - Farxiga 10 mg in am - Victoza 1.8 mg daily  When you finish Victoza, start Ozempic 0.5 mg weekly, Sunday mornings.  Please return in 4 months with your sugar log.   - today we will check a fructosamine level, since patient is more accurate for her than the directly measured HbA1c - continue checking sugars at different times of the day - check 1x a day, rotating checks - advised for yearly eye exams >> she is UTD - Return to clinic in 4 mo with sugar log       2. Obesity class 2 -Continue walking -Also, continue SGLT 2 inhibitor and GLP-1 receptor agonist, which should also help with weight loss  3. HL - Reviewed latest lipid panel from last summer: All fractions at goal Lab Results  Component Value Date   CHOL 128 01/07/2018   HDL 49.60 01/07/2018   LDLCALC 61 01/07/2018   TRIG 90.0 01/07/2018   CHOLHDL 3 01/07/2018  - Continues Lipitor without side effects.  Carlus Pavlov, MD PhD [facility] Endocrinology

## 2018-09-22 ENCOUNTER — Other Ambulatory Visit: Payer: Self-pay | Admitting: Internal Medicine

## 2018-09-24 ENCOUNTER — Ambulatory Visit
Admission: RE | Admit: 2018-09-24 | Discharge: 2018-09-24 | Disposition: A | Discharge status: Home / Self Care | Payer: 59 | Source: Ambulatory Visit | Attending: Family Medicine | Admitting: Family Medicine

## 2018-09-24 DIAGNOSIS — Z1231 Encounter for screening mammogram for malignant neoplasm of breast: Secondary | ICD-10-CM

## 2018-12-09 ENCOUNTER — Other Ambulatory Visit: Payer: Self-pay | Admitting: Internal Medicine

## 2018-12-10 ENCOUNTER — Other Ambulatory Visit: Payer: Self-pay | Admitting: Internal Medicine

## 2019-01-17 ENCOUNTER — Other Ambulatory Visit: Payer: Self-pay

## 2019-01-17 ENCOUNTER — Encounter: Payer: Self-pay | Admitting: Internal Medicine

## 2019-01-17 ENCOUNTER — Ambulatory Visit (INDEPENDENT_AMBULATORY_CARE_PROVIDER_SITE_OTHER): Payer: 59 | Admitting: Internal Medicine

## 2019-01-17 DIAGNOSIS — E669 Obesity, unspecified: Secondary | ICD-10-CM | POA: Diagnosis not present

## 2019-01-17 DIAGNOSIS — R809 Proteinuria, unspecified: Secondary | ICD-10-CM | POA: Diagnosis not present

## 2019-01-17 DIAGNOSIS — E785 Hyperlipidemia, unspecified: Secondary | ICD-10-CM | POA: Diagnosis not present

## 2019-01-17 DIAGNOSIS — E1129 Type 2 diabetes mellitus with other diabetic kidney complication: Secondary | ICD-10-CM

## 2019-01-17 NOTE — Patient Instructions (Addendum)
Please continue: - Metformin 1000 mg 2x a day, with meals - Glipizide ER XL 10 mg in a.m. and 5 mg before dinner - Farxiga 10 mg in am - Ozempic 0.5 mg weekly  Try to check sugars later in the day, also.  Let me know how the sugars are doing in 3-4 weeks.  Please return in 3-4 months with your sugar log.

## 2019-01-17 NOTE — Progress Notes (Signed)
Patient ID: Laurie Small, female   DOB: 09-27-1975, 43 y.o.   MRN: [id number]  Patient location: Home My location: Office  Referring Provider: Caren Macadam, MD  I connected with the patient on 01/17/19 at  3:04 PM EDT by a video enabled telemedicine application and verified that I am speaking with the correct person.   I discussed the limitations of evaluation and management by telemedicine and the availability of in person appointments. The patient expressed understanding and agreed to proceed.   Details of the encounter are shown below.  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 Last visit 4 months ago.  In the past, she was walking with her husband 4 times a week for 2.5 miles and this greatly helped her sugars.  At last visit, I strongly advised her to restart.  Last hemoglobin A1c was: 05/20/2018: HbA1c calculated from the fructosamine is 6.27%, still excellent. 01/07/2018: HbA1c calculated from fructosamine is 6.1% (great, slightly higher than before!) 03/22/2017: HbA1c calculated from fructosamine is 5.9%!  12/14/2016: HbA1c calculated from fructosamine is 6.6%. 09/14/2016: HbA1c calculated from fructosamine is MUCH lower, at 6.8%! Lab Results  Component Value Date   HGBA1C 8.1 (A) 05/20/2018   HGBA1C 9.0 (A) 01/07/2018   HGBA1C 9.3 09/28/2017  05/31/2015: HbA1c 10% 09/15/2014: HbA1c 9.6% 04/23/2014: HbA1c 10.9% 01/20/2014: HbA1c 12.9%  Pt is on a regimen of: - Metformin 1000 mg 2x a day, with meals - Glipizide ER XL 10 mg in a.m. and 5 mg before dinner - Farxiga 10 mg in am - now free for her - Ozempic 0.5 mg weekly -changed from [place] 08/2018 She was on Onglyza (too expensive).  She was on JanuMet (stopped being effective), then Januvia >> stopped when starting Trulicity She was on Victoza (trial  period). She refused insulin.  Pt checks her sugars 1-2 times a day: - am:  95-148, 159, 171 >> 83, 104-256 (high CBGs if she forgets Victoza) >> 110-150 - 2h after b'fast:n/c >> 115-205, 267 >> n/c - before lunch:176 >> n/c >> 126 >> n/c - 2h after lunch: 165 >> n/c >> 189, 194 >> 139 >> n/c - before dinner: n/c >> 136-205 >> n/c  - 2h after dinner: 159 >> n/c >> 134 >> n/c - bedtime: n/c >> 115-193, 206 >> n/c - nighttime: 85-142, 195 >> 73, 75, 214 >> n/c Lowest sugar was 83 >> 103; it is unclear at which level she has hypoglycemia awareness. Highest sugar was 256 >>200.  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 She works from home, from 5 AM to 1 PM.  -+ History of CKD, last BUN/creatinine:  Lab Results  Component Value Date   BUN 18 01/07/2018   CREATININE 0.97 01/07/2018   Lab Results  Component Value Date   GFRAA 82 01/07/2018    No recent microalbuminuria: Lab Results  Component Value Date   MICRALBCREAT 1.0 01/07/2018   ACR  (09/15/2014): 44.4 ACR (01/20/2014): 79.9 On ramipril.  -+ HL; last set of lipids reviewed: Lab Results  Component Value Date   CHOL 128 01/07/2018   HDL 49.60 01/07/2018   LDLCALC 61 01/07/2018   TRIG 90.0 01/07/2018  CHOLHDL 3 01/07/2018  03/31/2016: 136/69/49/73 06/12/2014: 147/118/44/80 On Lipitor 40.  - last eye exam was on 07/2018: No DR   - no numbness and tingling in her feet.  + Heel spurs.  She also has a history of HTN, PE; also, parathyroid surgery in 2001.  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 She stopped Digoxin >> on Corlanor now. 2D echo by her cardiologist >> EF improved to 50 to 55%..  ROS: Constitutional: no weight gain/no weight loss, no fatigue, no subjective hyperthermia, no  subjective hypothermia Eyes: no blurry vision, no xerophthalmia ENT: no sore throat, no nodules palpated in neck, no dysphagia, no odynophagia, no hoarseness Cardiovascular: no CP/no SOB/no palpitations/no leg swelling Respiratory: no cough/no SOB/no wheezing Gastrointestinal: no N/no V/no D/no C/no acid reflux Musculoskeletal: no muscle aches/no joint aches Skin: no rashes, no hair loss Neurological: no tremors/no numbness/no tingling/no dizziness  I reviewed pt's medications, allergies, PMH, social hx, family hx, and changes were documented in the history of present illness. Otherwise, unchanged from my initial visit note.  Past Medical History:  Diagnosis Date  . Depression   . NICM (nonischemic cardiomyopathy) (HCC)    2D ECHO, 12/26/2011 - EF 45-50%, left ventricle borderline dilated   Past Surgical History:  Procedure Laterality Date  . CARDIAC CATHETERIZATION  01/06/2002   Dilated nonsichemic cardiopmyopathy, EF 28%, at least 2+ angiographic mitral regurgitation   Social History   Social History  . Marital Status: Married    Spouse Name: N/A  . Number of Children: 1   Occupational History  .  claims benefits specialist    Social History Main Topics  . Smoking status: Never Smoker   . Smokeless tobacco: Not on file  . Alcohol Use: No  . Drug Use: No   Current Outpatient Medications on File Prior to Visit  Medication Sig Dispense Refill  . allopurinol (ZYLOPRIM) 100 MG tablet Take 1 tablet by mouth daily.    Marland Kitchen. aspirin 325 MG tablet Take 325 mg by mouth daily.    Marland Kitchen. atorvastatin (LIPITOR) 40 MG tablet Take 1 tablet (40 mg total) by mouth daily at 6 PM. 90 tablet 3  . carvedilol (COREG) 25 MG tablet TAKE 1 TABLET (25 MG TOTAL) BY MOUTH 2 (TWO) TIMES DAILY WITH A MEAL. 180 tablet 3  . colchicine 0.6 MG tablet Take 1 tablet (0.6 mg total) by mouth daily. 30 tablet 1  . CORLANOR 5 MG TABS tablet TAKE 1 TABLET BY MOUTH 2 TIMES DAILY WITH A MEAL 180 tablet 2  .  diphenhydrAMINE (BENADRYL) 25 MG tablet Take 25 mg by mouth daily.    Marland Kitchen. FARXIGA 10 MG TABS tablet TAKE 10 MG BY MOUTH DAILY. 90 tablet 3  . glipiZIDE (GLUCOTROL XL) 5 MG 24 hr tablet TAKE 2 TABLETS BY MOUTH BEFORE BREAKFAST AND 1/2 TABLET BY MOUTH BEFORE DINNER 225 tablet 3  . hydrochlorothiazide (HYDRODIURIL) 25 MG tablet Take 1 tablet (25 mg total) by mouth daily. 90 tablet 3  . hydrocortisone 2.5 % cream as needed.    . Insulin Pen Needle (BD PEN NEEDLE MICRO U/F) 32G X 6 MM MISC Use with Victoza pen daily 100 each 2  . liraglutide (VICTOZA) 18 MG/3ML SOPN Inject 0.3 mLs (1.8 mg total) into the skin daily. 9 pen 3  . metFORMIN (GLUCOPHAGE) 1000 MG tablet TAKE 1 TABLET (1,000 MG TOTAL) BY MOUTH 2 (TWO) TIMES DAILY WITH A MEAL. 180 tablet 3  . ramipril (ALTACE) 5 MG capsule Take 2 capsules  by mouth in the morning and 1 capsule in the evening. 270 capsule 3  . Semaglutide,0.25 or 0.5MG /DOS, (OZEMPIC, 0.25 OR 0.5 MG/DOSE,) 2 MG/1.5ML SOPN Inject 0.5 mg into the skin once a week. 2 pen 5  . spironolactone (ALDACTONE) 25 MG tablet Take 0.5 tablets (12.5 mg total) by mouth daily. 45 tablet 3   No current facility-administered medications on file prior to visit.    Allergies  Allergen Reactions  . Betadine [Povidone Iodine]   . Iodine   . Peanuts [Peanut Oil]   . Shellfish Allergy   . Other Hives and Rash    Sunlight and Grass    Family History  Problem Relation Age of Onset  . Hypertension Mother   . GI Bleed Mother   . Pneumonia Maternal Grandmother   . Cancer Paternal Grandmother        Breast cancer  . Breast cancer Paternal Grandmother    PE: There were no vitals taken for this visit. There is no height or weight on file to calculate BMI. Wt Readings from Last 3 Encounters:  09/17/18 203 lb (92.1 kg)  05/20/18 206 lb (93.4 kg)  04/12/18 205 lb 3.2 oz (93.1 kg)   Constitutional:  in NAD  The physical exam was not performed (virtual visit).  ASSESSMENT: 1. DM2,  non-insulin-dependent, uncontrolled, with complications - CKD  Cardiologist: Dr Rennis GoldenHilty  2. Obesity class 2 BMI Classification:  < 18.5 underweight   18.5-24.9 normal weight   25.0-29.9 overweight   30.0-34.9 class I obesity   35.0-39.9 class II obesity   ? 40.0 class III obesity   3. HL  PLAN:  1. Patient with longstanding, uncontrolled, type 2 diabetes, on oral antidiabetic regimen and also now weekly GLP-1 receptor agonist, changed from daily at last visit.  The insurance initially dictated to change from Trulicity to Victoza and at last visit we changed to Ozempic.  Of note, she did refuse insulin in the past. -At last visit, sugars are higher due to stress of changing her job and also stopping walking, along with dietary indiscretions.  We started the Ozempic and I advised her to increase the dose as tolerated. -At this visit, sugars have improved in the morning, but some of them are still above goal, up to 150.  Unfortunately, she is not checking sugars at all later in the day and I strongly advised her to do so.  Since the sugars did improve, I advised her to continue on the current regimen for another 3 to 4 weeks and in the meantime to try to gather data later in the day.  She will do so and get in touch with me within the next month to see if we need to increase Ozempic to 1 mg weekly.  I also advised her to try to improve her diet which, she admits, is worse. - I suggested to:  Patient Instructions  Please continue: - Metformin 1000 mg 2x a day, with meals - Glipizide ER XL 10 mg in a.m. and 5 mg before dinner - Farxiga 10 mg in am - Ozempic 0.5 mg weekly  Try to check sugars later in the day, also.  Let me know how the sugars are doing in 3-4 weeks.  Please return in 3-4 months with your sugar log.   - continue checking sugars at different times of the day - check 1x a day, rotating checks - advised for yearly eye exams >> she is UTD - Return to clinic in 4  mo with  sugar log        2. Obesity class 2 -Continue walking -Also, continue SGLT 2 inhibitor and GLP-1 receptor agonist, which should also help with weight loss.  3. HL - Reviewed latest lipid panel from 12/2017: All fractions at goal Lab Results  Component Value Date   CHOL 128 01/07/2018   HDL 49.60 01/07/2018   LDLCALC 61 01/07/2018   TRIG 90.0 01/07/2018   CHOLHDL 3 01/07/2018  - Continues Lipitor without side effects. - she is due for another lipid panel- she will have labs by PCP next month and I advised her to have them forwarded to me.  Carlus Pavlov, MD PhD [facility] Endocrinology

## 2019-03-09 ENCOUNTER — Other Ambulatory Visit: Payer: Self-pay | Admitting: Internal Medicine

## 2019-03-11 ENCOUNTER — Other Ambulatory Visit: Payer: Self-pay | Admitting: Internal Medicine

## 2019-03-18 ENCOUNTER — Other Ambulatory Visit: Payer: Self-pay | Admitting: Internal Medicine

## 2019-03-26 ENCOUNTER — Other Ambulatory Visit: Payer: Self-pay | Admitting: Internal Medicine

## 2019-04-06 ENCOUNTER — Other Ambulatory Visit: Payer: Self-pay | Admitting: Internal Medicine

## 2019-04-07 NOTE — Telephone Encounter (Signed)
Last fill 03/11/19  #3pen/1 Last OV 01/17/19

## 2019-04-15 ENCOUNTER — Other Ambulatory Visit: Payer: Self-pay | Admitting: Internal Medicine

## 2019-04-18 ENCOUNTER — Other Ambulatory Visit: Payer: Self-pay | Admitting: Internal Medicine

## 2019-04-22 ENCOUNTER — Other Ambulatory Visit: Payer: Self-pay | Admitting: Internal Medicine

## 2019-04-29 ENCOUNTER — Other Ambulatory Visit: Payer: Self-pay

## 2019-04-29 ENCOUNTER — Ambulatory Visit (INDEPENDENT_AMBULATORY_CARE_PROVIDER_SITE_OTHER): Payer: 59 | Admitting: Physician Assistant

## 2019-04-29 ENCOUNTER — Encounter: Payer: Self-pay | Admitting: Physician Assistant

## 2019-04-29 VITALS — BP 133/81 | HR 66 | Temp 96.8°F | Ht 63.0 in | Wt 204.0 lb

## 2019-04-29 DIAGNOSIS — I10 Essential (primary) hypertension: Secondary | ICD-10-CM

## 2019-04-29 DIAGNOSIS — I428 Other cardiomyopathies: Secondary | ICD-10-CM

## 2019-04-29 DIAGNOSIS — E119 Type 2 diabetes mellitus without complications: Secondary | ICD-10-CM | POA: Diagnosis not present

## 2019-04-29 MED ORDER — IVABRADINE HCL 5 MG PO TABS: 5.0000 mg | ORAL_TABLET | Freq: Two times a day (BID) | ORAL | 3 refills | Status: DC

## 2019-04-29 MED ORDER — CARVEDILOL 25 MG PO TABS: 25.0000 mg | ORAL_TABLET | Freq: Two times a day (BID) | ORAL | 3 refills | Status: DC

## 2019-04-29 MED ORDER — SPIRONOLACTONE 25 MG PO TABS: 12.5000 mg | ORAL_TABLET | Freq: Every day | ORAL | 3 refills | Status: DC

## 2019-04-29 MED ORDER — ATORVASTATIN CALCIUM 40 MG PO TABS: 40.0000 mg | ORAL_TABLET | Freq: Every day | ORAL | 3 refills | Status: DC

## 2019-04-29 MED ORDER — RAMIPRIL 5 MG PO CAPS: ORAL_CAPSULE | ORAL | 3 refills | Status: DC

## 2019-04-29 MED ORDER — HYDROCHLOROTHIAZIDE 25 MG PO TABS: 25.0000 mg | ORAL_TABLET | Freq: Every day | ORAL | 3 refills | Status: DC

## 2019-04-29 NOTE — Progress Notes (Signed)
Cardiology Office Note    Date:  04/30/2019   ID:  Laurie Small, DOB 13-Aug-1975, MRN [id number]  PCP:  Aliene BeamsHagler, Rachel, MD  Cardiologist:  Dr. Rennis GoldenHilty Endocrinologist: Carlus Pavlovristina Gherghe  Chief Complaint  Patient presents with  . Follow-up    seen for Dr. Rennis GoldenHilty    History of Present Illness:  Laurie Small is a 43 y.o. female with past medical history of nonischemic cardiomyopathy, hypertension, DM 2, obesity and depression.  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  Cardiac catheterization at the time showed EF 25 to 30%, no obstructive coronary artery disease.  She developed pulmonary emboli in 2003 as well and was on Coumadin for 2 years.  This was later switched to aspirin 325 mg daily.  EF improved to 45 to 50% by echocardiogram in 2011 with medical therapy.  Since then, her EF has remained 40 to 45% by echocardiogram in 2015 and 2017.  She was initiated on Corlanor.  Last echocardiogram obtained on 05/14/2017 showed EF 50 to 55%, grade 1 DD, no significant valvular disease.   Patient presents today for cardiology office visit.  She denies any recent chest pain or shortness of breath.  She has no lower extremity edema, orthopnea or PND.  She is not very active at home.   Past Medical History:  Diagnosis Date  . Depression   . NICM (nonischemic cardiomyopathy) (HCC)    2D ECHO, 12/26/2011 - EF 45-50%, left ventricle borderline dilated    Past Surgical History:  Procedure Laterality Date  . CARDIAC CATHETERIZATION  01/06/2002   Dilated nonsichemic cardiopmyopathy, EF 28%, at least 2+ angiographic mitral regurgitation    Current Medications: Outpatient Medications Prior to Visit  Medication Sig Dispense Refill  . allopurinol (ZYLOPRIM) 100 MG tablet Take 1 tablet by mouth daily.    Marland Kitchen. aspirin 325 MG tablet Take 325 mg by mouth daily.    . diphenhydrAMINE (BENADRYL) 25 MG tablet Take 25 mg by mouth daily.    Marland Kitchen. FARXIGA 10 MG TABS tablet TAKE 10 MG  BY MOUTH DAILY. 90 tablet 3  . glipiZIDE (GLUCOTROL XL) 5 MG 24 hr tablet TAKE 2 TABLETS BY MOUTH BEFORE BREAKFAST AND 1/2 TABLET BY MOUTH BEFORE DINNER 225 tablet 3  . hydrocortisone 2.5 % cream as needed.    . Insulin Pen Needle (BD PEN NEEDLE MICRO U/F) 32G X 6 MM MISC Use with Victoza pen daily 100 each 2  . metFORMIN (GLUCOPHAGE) 1000 MG tablet TAKE 1 TABLET (1,000 MG TOTAL) BY MOUTH 2 (TWO) TIMES DAILY WITH A MEAL. 180 tablet 3  . OZEMPIC, 0.25 OR 0.5 MG/DOSE, 2 MG/1.5ML SOPN INJECT 0.5 MG INTO THE SKIN ONCE A WEEK. 3 pen 1  . atorvastatin (LIPITOR) 40 MG tablet TAKE 1 TABLET (40 MG TOTAL) BY MOUTH DAILY AT 6 PM. 90 tablet 0  . carvedilol (COREG) 25 MG tablet TAKE 1 TABLET BY MOUTH TWICE DAILY WITH A MEAL MUST KEEP 04/29/2019 APPT FOR FURTHER REFILLS 60 tablet 0  . CORLANOR 5 MG TABS tablet TAKE 1 TABLET BY MOUTH 2 TIMES DAILY WITH A MEAL 180 tablet 2  . hydrochlorothiazide (HYDRODIURIL) 25 MG tablet Take 1 tablet (25 mg total) by mouth daily. NEED OV. 90 tablet 0  . ramipril (ALTACE) 5 MG capsule Take 2 capsules by mouth in the morning and 1 capsule in the evening. 270 capsule 3  . spironolactone (ALDACTONE) 25 MG tablet TAKE 1/2 TABLET BY MOUTH EVERY DAY.NEED OFFICE VISIT 30 tablet  0  . colchicine 0.6 MG tablet Take 1 tablet (0.6 mg total) by mouth daily. (Patient not taking: Reported on 04/29/2019) 30 tablet 1  . liraglutide (VICTOZA) 18 MG/3ML SOPN Inject 0.3 mLs (1.8 mg total) into the skin daily. (Patient not taking: Reported on 04/29/2019) 9 pen 3   No facility-administered medications prior to visit.      Allergies:   Betadine [povidone iodine], Iodine, Peanuts [peanut oil], Shellfish allergy, and Other   Social History   Socioeconomic History  . Marital status: Married    Spouse name: Not on file  . Number of children: Not on file  . Years of education: Not on file  . Highest education level: Not on file  Occupational History  . Not on file  Social Needs  . Financial  resource strain: Not on file  . Food insecurity    Worry: Not on file    Inability: Not on file  . Transportation needs    Medical: Not on file    Non-medical: Not on file  Tobacco Use  . Smoking status: Never Smoker  . Smokeless tobacco: Never Used  Substance and Sexual Activity  . Alcohol use: No  . Drug use: No  . Sexual activity: Not on file  Lifestyle  . Physical activity    Days per week: Not on file    Minutes per session: Not on file  . Stress: Not on file  Relationships  . Social Musician on phone: Not on file    Gets together: Not on file    Attends religious service: Not on file    Active member of club or organization: Not on file    Attends meetings of clubs or organizations: Not on file    Relationship status: Not on file  Other Topics Concern  . Not on file  Social History Narrative  . Not on file     Family History:  The patient's family history includes Breast cancer in her paternal grandmother; Cancer in her paternal grandmother; GI Bleed in her mother; Hypertension in her mother; Pneumonia in her maternal grandmother.   ROS:   Please see the history of present illness.    ROS All other systems reviewed and are negative.   PHYSICAL EXAM:   VS:  BP 133/81   Pulse 66   Temp (!) 96.8 F (36 C)   Ht 5\' 3"  (1.6 m)   Wt 204 lb (92.5 kg)   SpO2 100%   BMI 36.14 kg/m    GEN: Well nourished, well developed, in no acute distress  HEENT: normal  Neck: no JVD, carotid bruits, or masses Cardiac: RRR; no murmurs, rubs, or gallops,no edema  Respiratory:  clear to auscultation bilaterally, normal work of breathing GI: soft, nontender, nondistended, + BS MS: no deformity or atrophy  Skin: warm and dry, no rash Neuro:  Alert and Oriented x 3, Strength and sensation are intact Psych: euthymic mood, full affect  Wt Readings from Last 3 Encounters:  04/29/19 204 lb (92.5 kg)  09/17/18 203 lb (92.1 kg)  05/20/18 206 lb (93.4 kg)       Studies/Labs Reviewed:   EKG:  EKG is ordered today.  The ekg ordered today demonstrates NSR without significant ST T wave changes, poor R wave progression in anterior leads and possible Q wave in inferior leads  Recent Labs: No results found for requested labs within last 8760 hours.   Lipid Panel    Component Value  Date/Time   CHOL 128 01/07/2018 0943   TRIG 90.0 01/07/2018 0943   HDL 49.60 01/07/2018 0943   CHOLHDL 3 01/07/2018 0943   VLDL 18.0 01/07/2018 0943   LDLCALC 61 01/07/2018 0943    Additional studies/ records that were reviewed today include:   Echo 05/14/2017 LV EF: 50% -   55% Study Conclusions  - Left ventricle: The cavity size was normal. There was mild   concentric hypertrophy. Systolic function was normal. The   estimated ejection fraction was in the range of 50% to 55%. Wall   motion was normal; there were no regional wall motion   abnormalities. Doppler parameters are consistent with abnormal   left ventricular relaxation (grade 1 diastolic dysfunction).   There was no evidence of elevated ventricular filling pressure by   Doppler parameters. - Aortic valve: There was no regurgitation. - Aortic root: The aortic root was normal in size. - Mitral valve: There was trivial regurgitation. - Tricuspid valve: There was mild regurgitation. - Pulmonic valve: There was no regurgitation. - Pulmonary arteries: Systolic pressure was within the normal   range. - Inferior vena cava: The vessel was normal in size. - Pericardium, extracardiac: There was no pericardial effusion.  Impressions:  - When compared to the prior study from 05/12/2016 LVEF has   improved from 40-45% to 50-55%.   ASSESSMENT:    1. NICM (nonischemic cardiomyopathy) (HCC)   2. Essential hypertension   3. Controlled type 2 diabetes mellitus without complication, without long-term current use of insulin (HCC)      PLAN:  In order of problems listed above:  1. Nonischemic  cardiomyopathy: EF normalized on previous echocardiogram in 2018.  She is euvolemic on physical exam.  She has no lower extremity edema, orthopnea or PND.  She had a prior history of inappropriate sinus tachycardia, this is being well controlled on carvedilol and corlanor  2. Hypertension: Blood pressure stable  3. DM2: Managed by primary care provider.   Medication Adjustments/Labs and Tests Ordered: Current medicines are reviewed at length with the patient today.  Concerns regarding medicines are outlined above.  Medication changes, Labs and Tests ordered today are listed in the Patient Instructions below. Patient Instructions  Medication Instructions:   Your physician recommends that you continue on your current medications as directed. Please refer to the Current Medication list given to you today.  If you need a refill on your cardiac medications before your next appointment, please call your pharmacy.   Lab work:  NONE ordered at this time of appointment   If you have labs (blood work) drawn today and your tests are completely normal, you will receive your results only by: Marland Kitchen MyChart Message (if you have MyChart) OR . A paper copy in the mail If you have any lab test that is abnormal or we need to change your treatment, we will call you to review the results.  Testing/Procedures: NONE ordered at this time of appointment   Follow-Up: At [facility], you and your health needs are our priority.  As part of our continuing mission to provide you with exceptional heart care, we have created designated Provider Care Teams.  These Care Teams include your primary Cardiologist (physician) and Advanced Practice Providers (APPs -  Physician Assistants and Nurse Practitioners) who all work together to provide you with the care you need, when you need it. You will need a follow up appointment in 12 months-October 2021.  Please call our office in July and/or August  2021 to schedule this  appointment with Pixie Casino, MD or one of the following Advanced Practice Providers on your designated Care Team: [place], [place] . Fabian Sharp, PA-C  Any Other Special Instructions Will Be Listed Below (If Applicable).       Hilbert Corrigan, [place]  04/30/2019 12:06 AM    [place], [place], [place]  [address] Phone: [phone]; Fax: [phone]

## 2019-04-29 NOTE — Patient Instructions (Signed)
Medication Instructions:   Your physician recommends that you continue on your current medications as directed. Please refer to the Current Medication list given to you today.  If you need a refill on your cardiac medications before your next appointment, please call your pharmacy.   Lab work:  NONE ordered at this time of appointment   If you have labs (blood work) drawn today and your tests are completely normal, you will receive your results only by: Marland Kitchen MyChart Message (if you have MyChart) OR . A paper copy in the mail If you have any lab test that is abnormal or we need to change your treatment, we will call you to review the results.  Testing/Procedures: NONE ordered at this time of appointment   Follow-Up: At [facility], you and your health needs are our priority.  As part of our continuing mission to provide you with exceptional heart care, we have created designated Provider Care Teams.  These Care Teams include your primary Cardiologist (physician) and Advanced Practice Providers (APPs -  Physician Assistants and Nurse Practitioners) who all work together to provide you with the care you need, when you need it. You will need a follow up appointment in 12 months-October 2021.  Please call our office in July and/or August 2021 to schedule this appointment with Pixie Casino, MD or one of the following Advanced Practice Providers on your designated Care Team: [place], [place] . Fabian Sharp, PA-C  Any Other Special Instructions Will Be Listed Below (If Applicable).

## 2019-04-30 ENCOUNTER — Encounter: Payer: Self-pay | Admitting: Physician Assistant

## 2019-05-10 ENCOUNTER — Other Ambulatory Visit: Payer: Self-pay | Admitting: Internal Medicine

## 2019-05-21 ENCOUNTER — Other Ambulatory Visit: Payer: Self-pay

## 2019-05-23 ENCOUNTER — Other Ambulatory Visit: Payer: Self-pay

## 2019-05-23 ENCOUNTER — Encounter: Payer: Self-pay | Admitting: Internal Medicine

## 2019-05-23 ENCOUNTER — Ambulatory Visit (INDEPENDENT_AMBULATORY_CARE_PROVIDER_SITE_OTHER): Payer: 59 | Admitting: Internal Medicine

## 2019-05-23 VITALS — BP 124/78 | HR 82 | Ht 63.0 in | Wt 206.0 lb

## 2019-05-23 DIAGNOSIS — R809 Proteinuria, unspecified: Secondary | ICD-10-CM | POA: Diagnosis not present

## 2019-05-23 DIAGNOSIS — E1129 Type 2 diabetes mellitus with other diabetic kidney complication: Secondary | ICD-10-CM

## 2019-05-23 DIAGNOSIS — E669 Obesity, unspecified: Secondary | ICD-10-CM

## 2019-05-23 DIAGNOSIS — E785 Hyperlipidemia, unspecified: Secondary | ICD-10-CM

## 2019-05-23 LAB — POCT GLYCOSYLATED HEMOGLOBIN (HGB A1C): Hemoglobin A1C: 7.6 % — AB (ref 4.0–5.6)

## 2019-05-23 MED ORDER — OZEMPIC (1 MG/DOSE) 2 MG/1.5ML ~~LOC~~ SOPN: 1.0000 mg | PEN_INJECTOR | SUBCUTANEOUS | 5 refills | Status: DC

## 2019-05-23 NOTE — Progress Notes (Addendum)
Patient ID: Keyundra Fant, female   DOB: 1975-12-24, 43 y.o.   MRN: [id number]  HPI: Mekenzie Modeste is a 43 y.o.-year-old female, presenting for f/u for DM2, dx in 2003 (after an episode of myocarditis), non-insulin-dependent, uncontrolled, with complications (microalbuminuria). Last visit 4 months ago (virtual)  Last hemoglobin A1c was: 05/20/2018: HbA1c calculated from the fructosamine is 6.27%, still excellent. 01/07/2018: HbA1c calculated from fructosamine is 6.1% (great, slightly higher than before!) 03/22/2017: HbA1c calculated from fructosamine is 5.9%!  12/14/2016: HbA1c calculated from fructosamine is 6.6%. 09/14/2016: HbA1c calculated from fructosamine is MUCH lower, at 6.8%! Lab Results  Component Value Date   HGBA1C 8.1 (A) 05/20/2018   HGBA1C 9.0 (A) 01/07/2018   HGBA1C 9.3 09/28/2017  05/31/2015: HbA1c 10% 09/15/2014: HbA1c 9.6% 04/23/2014: HbA1c 10.9% 01/20/2014: HbA1c 12.9%  Pt is on a regimen of: - Metformin 1000 mg 2x a day, with meals - Glipizide ER XL 10 mg before breakfast and 5 mg before dinner - Farxiga 10 mg in am - Ozempic 0.5 mg weekly -changed from Victoza 08/2018 She was on Onglyza (too expensive).  She was on JanuMet (stopped being effective), then Januvia >> stopped when starting Trulicity She was on Victoza (trial period). She refuses insulin.  Pt checks her sugars 1-2 times a day and they are higher: - am:  95-148, 159, 171 >> 83, 104-256 >> 110-150 >> 106, 109, 120-219 - 2h after b'fast:n/c >> 115-205, 267 >> n/c >> 244 - before lunch:176 >> n/c >> 126 >> n/c - 2h after lunch: 165 >> n/c >> 189, 194 >> 139 >> n/c - before dinner: n/c >> 136-205 >> n/c >> 153 - 2h after dinner: 159 >> n/c >> 134 >> n/c - bedtime: n/c >> 115-193, 206 >> n/c - nighttime: 85-142, 195 >> 73, 75, 214 >> n/c Lowest sugar was 83 >> 103 >> 106; it is unclear at which level she has hypoglycemia awareness. Highest sugar was 256 >> 200 >> 219.  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  She works from home, finishes later, at 8 pm, so dinner is quite late.  -+ History of CKD, last BUN/creatinine:  03/27/2019: 17/1.01, GFR 72, glucose 128 Lab Results  Component Value Date   BUN 18 01/07/2018   CREATININE 0.97 01/07/2018   Lab Results  Component Value Date   GFRAA 82 01/07/2018    No recent microalbuminuria: 03/27/2019: ACR 21.2 Lab Results  Component Value Date   MICRALBCREAT 1.0 01/07/2018  ACR  (09/15/2014): 44.4 ACR (01/20/2014): 79.9 On ramipril.  -+ HL;  last set of lipids: 03/27/2019: 147/138/57/62 Lab Results  Component Value Date   CHOL 128 01/07/2018   HDL 49.60 01/07/2018   LDLCALC 61 01/07/2018   TRIG 90.0 01/07/2018   CHOLHDL 3 01/07/2018  03/31/2016: 136/69/49/73 06/12/2014: 147/118/44/80 On Lipitor 40.  - last eye exam was in 07/2018: No DR  - no numbness and tingling in her feet.  She has heel spurs.  She also has a history of HTN, PE; also, parathyroid surgery in 2001.  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 She stopped Digoxin >> on Corlanor now. 2D echo by her cardiologist >> EF improved to 50 to 55%..  ROS: Constitutional: no weight gain/no weight loss, no fatigue,  no subjective hyperthermia, no subjective hypothermia Eyes: no blurry vision, no xerophthalmia ENT: no sore throat, no nodules palpated in neck, no dysphagia, no odynophagia, no hoarseness Cardiovascular: no CP/no SOB/no palpitations/no leg swelling Respiratory: no cough/no SOB/no wheezing Gastrointestinal: no N/no V/no D/no C/no acid reflux Musculoskeletal: no muscle aches/no joint aches Skin: no rashes, no hair loss Neurological: no tremors/no numbness/no tingling/no dizziness  I reviewed pt's  medications, allergies, PMH, social hx, family hx, and changes were documented in the history of present illness. Otherwise, unchanged from my initial visit note.  Past Medical History:  Diagnosis Date  . Depression   . NICM (nonischemic cardiomyopathy) (HCC)    2D ECHO, 12/26/2011 - EF 45-50%, left ventricle borderline dilated   Past Surgical History:  Procedure Laterality Date  . CARDIAC CATHETERIZATION  01/06/2002   Dilated nonsichemic cardiopmyopathy, EF 28%, at least 2+ angiographic mitral regurgitation   Social History   Social History  . Marital Status: Married    Spouse Name: N/A  . Number of Children: 1   Occupational History  .  claims benefits specialist    Social History Main Topics  . Smoking status: Never Smoker   . Smokeless tobacco: Not on file  . Alcohol Use: No  . Drug Use: No   Current Outpatient Medications on File Prior to Visit  Medication Sig Dispense Refill  . allopurinol (ZYLOPRIM) 100 MG tablet Take 1 tablet by mouth daily.    Marland Kitchen. aspirin 325 MG tablet Take 325 mg by mouth daily.    Marland Kitchen. atorvastatin (LIPITOR) 40 MG tablet Take 1 tablet (40 mg total) by mouth daily at 6 PM. 90 tablet 3  . carvedilol (COREG) 25 MG tablet Take 1 tablet (25 mg total) by mouth 2 (two) times daily with a meal. TAKE 1 TABLET BY MOUTH TWICE DAILY WITH A MEAL 180 tablet 3  . diphenhydrAMINE (BENADRYL) 25 MG tablet Take 25 mg by mouth daily.    Marland Kitchen. FARXIGA 10 MG TABS tablet TAKE 10 MG BY MOUTH DAILY. 90 tablet 3  . glipiZIDE (GLUCOTROL XL) 5 MG 24 hr tablet TAKE 2 TABLETS BY MOUTH BEFORE BREAKFAST AND 1/2 TABLET BY MOUTH BEFORE DINNER 225 tablet 3  . hydrochlorothiazide (HYDRODIURIL) 25 MG tablet Take 1 tablet (25 mg total) by mouth daily. 90 tablet 3  . hydrocortisone 2.5 % cream as needed.    . Insulin Pen Needle (BD PEN NEEDLE MICRO U/F) 32G X 6 MM MISC Use with Victoza pen daily 100 each 2  . ivabradine (CORLANOR) 5 MG TABS tablet Take 1 tablet (5 mg total) by mouth 2 (two) times  daily with a meal. 180 tablet 3  . metFORMIN (GLUCOPHAGE) 1000 MG tablet TAKE 1 TABLET (1,000 MG TOTAL) BY MOUTH 2 (TWO) TIMES DAILY WITH A MEAL. 180 tablet 3  . OZEMPIC, 0.25 OR 0.5 MG/DOSE, 2 MG/1.5ML SOPN INJECT 0.5 MG INTO THE SKIN ONCE A WEEK. 3 pen 1  . ramipril (ALTACE) 5 MG capsule Take 2 capsules by mouth in the morning and 1 capsule in the evening. 270 capsule 3  . spironolactone (ALDACTONE) 25 MG tablet Take 0.5 tablets (12.5 mg total) by mouth daily. 30 tablet 0   No current facility-administered medications on file prior to visit.    Allergies  Allergen Reactions  . Betadine [Povidone Iodine]   . Iodine   . Peanuts [Peanut Oil]   . Shellfish Allergy   . Other Hives and Rash    Sunlight and Grass  Family History  Problem Relation Age of Onset  . Hypertension Mother   . GI Bleed Mother   . Pneumonia Maternal Grandmother   . Cancer Paternal Grandmother        Breast cancer  . Breast cancer Paternal Grandmother    PE: BP 124/78 (BP Location: Left Wrist, Patient Position: Sitting, Cuff Size: Normal)   Pulse 82   Ht 5\' 3"  (1.6 m)   Wt 206 lb (93.4 kg)   SpO2 99%   BMI 36.49 kg/m  Body mass index is 36.49 kg/m. Wt Readings from Last 3 Encounters:  05/23/19 206 lb (93.4 kg)  04/29/19 204 lb (92.5 kg)  09/17/18 203 lb (92.1 kg)   Constitutional: overweight, in NAD Eyes: PERRLA, EOMI, no exophthalmos ENT: moist mucous membranes, no thyromegaly, no cervical lymphadenopathy Cardiovascular: RRR, No MRG Respiratory: CTA B Gastrointestinal: abdomen soft, NT, ND, BS+ Musculoskeletal: no deformities, strength intact in all 4 Skin: moist, warm, no rashes Neurological: no tremor with outstretched hands, DTR normal in all 4  ASSESSMENT: 1. DM2, non-insulin-dependent, uncontrolled, with complications - CKD  Cardiologist: Dr Rennis Golden  2. Obesity class 2 BMI Classification:  < 18.5 underweight   18.5-24.9 normal weight   25.0-29.9 overweight   30.0-34.9 class I  obesity   35.0-39.9 class II obesity   ? 40.0 class III obesity   3. HL  PLAN:  1. Patient with longstanding, uncontrolled, type 2 diabetes, on oral antidiabetic regimen with metformin, sulfonylurea, and SGLT 2 inhibitor and also weekly GLP-1 receptor agonist.  Of note, she did refuse insulin in the past.  At last visit, sugars are better in the morning but some of them are still above goal, up to 150.  Unfortunately, she was not checking sugars later in the day and I strongly advised her to do so.  Since the sugars did appear improved, I advised her to continue the same regimen for another 3 to 4 weeks and to check sugars later in the day and then let me know about trends to see if we need to increase the dose of Ozempic.  I also advised her to try to improve her diet, which was worse. -At this visit, sugars are worse.  They are quite fluctuating in the morning, but mostly above target.  She mentions that this happens since she finishes work late and she has a late dinner.  We discussed that the later the dinner, the lighter the meal >> if she can only have dinner after 8 PM, they should be very small, maybe an apple or bowl of berries -She again refuses insulin.  For now, I advised her to increase Ozempic to 1 mg weekly and continue the rest of the regimen. -I advised her to check sugars later in the day also, since now she is still only checking in the morning - I suggested to:  Patient Instructions  Please continue: - Metformin 1000 mg 2x a day, with meals - Glipizide ER XL 10 mg before breakfast and 5 mg before dinner - Farxiga 10 mg in am   Please increase: - Ozempic 1 mg weekly  Please return in 3-4 months with your sugar log.   - we checked her HbA1c: 7.6% (lower) - advised to check sugars at different times of the day - 1x a day, rotating check times - advised for yearly eye exams >> she is UTD - UTD with flu shot - return to clinic in 3-4 months  2. Obesity class  2 -She is walking for exercise -Continue SGLT 2 inhibitor and GLP-1 receptor agonist, which should also help with weight loss -Gained 3 pounds since 08/2018  3. HL -Reviewed latest lipid panel from 03/27/2019: 147/138/57/62 - all fractions at goal -Continues Lipitor without side effects  Component     Latest Ref Rng & Units 05/23/2019  Hemoglobin A1C     4.0 - 5.6 % 7.6 (A)  Fructosamine     205 - 285 umol/L 245   HbA1c calculated from fructosamine is 5.77%, in this case, discrepant with the sugars at home.  I feel that her directly measured HbA1c is now more accurate.  Philemon Kingdom, MD PhD [facility] Endocrinology

## 2019-05-23 NOTE — Patient Instructions (Addendum)
Please continue: - Metformin 1000 mg 2x a day, with meals - Glipizide ER XL 10 mg before breakfast and 5 mg before dinner - Farxiga 10 mg in am   Please increase: - Ozempic 1 mg weekly  Please return in 3-4 months with your sugar log.

## 2019-05-27 LAB — FRUCTOSAMINE: Fructosamine: 245 umol/L (ref 205–285)

## 2019-06-07 ENCOUNTER — Other Ambulatory Visit: Payer: Self-pay | Admitting: Internal Medicine

## 2019-08-15 ENCOUNTER — Other Ambulatory Visit: Payer: Self-pay | Admitting: Internal Medicine

## 2019-08-22 ENCOUNTER — Other Ambulatory Visit: Payer: Self-pay | Admitting: Internal Medicine

## 2019-08-25 ENCOUNTER — Telehealth: Payer: Self-pay | Admitting: Internal Medicine

## 2019-08-25 NOTE — Telephone Encounter (Signed)
Patient called re: Patient called PHARM to pick up Ozempic. PHARM told patient she needs to contact Dr. Charlean Sanfilippo office to get an appointment. Patient is already scheduled for 6 month follow up appointment on 11/21/19. A RX was sent for Ozempic on 08/14/19 with lower dosage. Patient states the dosage of Ozempic was increased by Dr. Elvera Lennox. PHARM is:  [id number] - [place], [place] - [address] Phone:  [phone]  Fax:  [phone]     Please advise.

## 2019-08-26 MED ORDER — OZEMPIC (1 MG/DOSE) 2 MG/1.5ML ~~LOC~~ SOPN: 1.0000 mg | PEN_INJECTOR | SUBCUTANEOUS | 5 refills | Status: DC

## 2019-08-26 NOTE — Telephone Encounter (Signed)
Last chart note reviewed and Dr. Corky Sox did increase dose but had not sent to pharmacy.  I have removed old dose and sent new one to pharmacy.

## 2019-10-16 ENCOUNTER — Other Ambulatory Visit: Payer: Self-pay | Admitting: Family Medicine

## 2019-10-16 DIAGNOSIS — Z1231 Encounter for screening mammogram for malignant neoplasm of breast: Secondary | ICD-10-CM

## 2019-10-27 ENCOUNTER — Other Ambulatory Visit: Payer: Self-pay

## 2019-10-27 ENCOUNTER — Ambulatory Visit
Admission: RE | Admit: 2019-10-27 | Discharge: 2019-10-27 | Disposition: A | Discharge status: Home / Self Care | Payer: 59 | Source: Ambulatory Visit | Attending: Family Medicine | Admitting: Family Medicine

## 2019-10-27 DIAGNOSIS — Z1231 Encounter for screening mammogram for malignant neoplasm of breast: Secondary | ICD-10-CM

## 2019-11-19 ENCOUNTER — Other Ambulatory Visit: Payer: Self-pay

## 2019-11-21 ENCOUNTER — Encounter: Payer: Self-pay | Admitting: Internal Medicine

## 2019-11-21 ENCOUNTER — Other Ambulatory Visit: Payer: Self-pay

## 2019-11-21 ENCOUNTER — Ambulatory Visit (INDEPENDENT_AMBULATORY_CARE_PROVIDER_SITE_OTHER): Payer: 59 | Admitting: Internal Medicine

## 2019-11-21 VITALS — BP 130/86 | HR 81 | Ht 63.0 in | Wt 201.0 lb

## 2019-11-21 DIAGNOSIS — E1129 Type 2 diabetes mellitus with other diabetic kidney complication: Secondary | ICD-10-CM

## 2019-11-21 DIAGNOSIS — E785 Hyperlipidemia, unspecified: Secondary | ICD-10-CM | POA: Diagnosis not present

## 2019-11-21 DIAGNOSIS — E669 Obesity, unspecified: Secondary | ICD-10-CM | POA: Diagnosis not present

## 2019-11-21 DIAGNOSIS — R809 Proteinuria, unspecified: Secondary | ICD-10-CM | POA: Diagnosis not present

## 2019-11-21 LAB — POCT GLYCOSYLATED HEMOGLOBIN (HGB A1C): Hemoglobin A1C: 7.6 % — AB (ref 4.0–5.6)

## 2019-11-21 NOTE — Addendum Note (Signed)
Addended by: Darliss Ridgel I on: 11/21/2019 02:23 PM   Modules accepted: Orders

## 2019-11-21 NOTE — Patient Instructions (Addendum)
Please continue: - Metformin 1000 mg 2x a day, with meals - Glipizide ER XL 10 mg before breakfast and 5 mg before dinner - Farxiga 10 mg in am  - Ozempic 1 mg weekly  Please schedule an appt with Oran Rein with nutrition.  Please return in 3 months with your sugar log.   - Please review the following diet suggestions and look at the recommended reading list - Do not drink your calories (sodas of any kind, sweet tea, or juice). Substitute with fresh fruit-flavored water. - Eat low glycemic index foods. Avoid highly-processed sugars.  - I suggest a whole food plant-based diet (please see patient instructions).  Next best thing is probably a Mediterranean diet. - Exercise - any level of activity during the day, even walking. - Go to bed early, if you can - Try not to eat when it's dark outside  Please consider the following ways to cut down carbs and fat and increase fiber and micronutrients in your diet: - substitute whole grain for white bread or pasta - substitute brown rice for white rice - substitute 90-calorie flat bread pieces for slices of bread when possible - substitute sweet potatoes or yams for white potatoes - substitute humus for margarine - substitute tofu for cheese when possible - substitute almond or rice milk for regular milk (would not drink soy milk daily due to concern for soy estrogen influence on breast cancer risk) - substitute dark chocolate for other sweets when possible - substitute water - can add lemon or orange slices for taste - for diet sodas (artificial sweeteners will trick your body that you can eat sweets without getting calories and will lead you to overeating and weight gain in the long run) - do not skip breakfast or other meals (this will slow down the metabolism and will result in more weight gain over time)  - can try smoothies made from fruit and almond/rice milk in am instead of regular breakfast - can also try old-fashioned (not instant)  oatmeal made with almond/rice milk in am - order the dressing on the side when eating salad at a restaurant (pour less than half of the dressing on the salad) - eat as little meat as possible - can try juicing, but should not forget that juicing will get rid of the fiber, so would alternate with eating raw veg./fruits or drinking smoothies - use as little oil as possible, even when using olive oil - can dress a salad with a mix of balsamic vinegar and lemon juice, for e.g. - use agave nectar, stevia sugar, or regular sugar rather than artificial sweateners - steam or broil/roast veggies  - snack on veggies/fruit/nuts (unsalted, preferably) when possible, rather than processed foods - reduce or eliminate aspartame in diet (it is in diet sodas, chewing gum, etc) Read the labels!   Plant-based diet materials:  - Lectures (you tube):  Dr. Lequita Asal: Breaking the Food Seduction  Dr. Chip Boer: How to Lose Weight, without Losing Your Mind  Dr. Casimiro Needle Greger: How Not To Die: The Role of Diet in Preventing, Arresting, and Reversing Our Top 15 Killers   - Documentaries:  Supersize Me  Forks over [employer]  What the Health  The Game Changers  - Facebook pages:   Forks versus Knives  Nutrition facts  [employer]eg[employer] Magazine  - Books:  Juanetta Beets - The Engine 2 diet  Dr. Wylene Simmer - The Obesity Code  Dr. Gerri Spore - How Not to Die

## 2019-11-21 NOTE — Progress Notes (Signed)
Patient ID: Laurie Small, female   DOB: 1975-11-15, 44 y.o.   MRN: [id number]  This visit occurred during the SARS-CoV-2 public health emergency.  Safety protocols were in place, including screening questions prior to the visit, additional usage of staff PPE, and extensive cleaning of exam room while observing appropriate contact time as indicated for disinfecting solutions.   HPI: Laurie Small is a 44 y.o.-year-old female, presenting for f/u for DM2, dx in 2003 (after an episode of myocarditis), non-insulin-dependent, uncontrolled, with complications (microalbuminuria). Last visit 6 months ago.  Latest HbA1c levels reviewed: 05/23/2019: HbA1c calculated from fructosamine is 5.77%, in this case, however, discrepant with the sugars at home. Lab Results  Component Value Date   HGBA1C 7.6 (A) 05/23/2019   HGBA1C 8.1 (A) 05/20/2018   HGBA1C 9.0 (A) 01/07/2018  05/20/2018: HbA1c calculated from the fructosamine is 6.27%, still excellent. 01/07/2018: HbA1c calculated from fructosamine is 6.1% (great, slightly higher than before!) 03/22/2017: HbA1c calculated from fructosamine is 5.9%!  12/14/2016: HbA1c calculated from fructosamine is 6.6%. 09/14/2016: HbA1c calculated from fructosamine is MUCH lower, at 6.8%! 05/31/2015: HbA1c 10% 09/15/2014: HbA1c 9.6% 04/23/2014: HbA1c 10.9% 01/20/2014: HbA1c 12.9%  Pt is on a regimen of: - Metformin 1000 mg 2x a day, with meals - Glipizide ER XL 10 mg before breakfast and 5 mg before dinner - Farxiga 10 mg in am - Ozempic 0.5 >> 1 mg weekly  She was on Onglyza (too expensive).  She was on JanuMet (stopped being effective), then Januvia >> stopped when starting Trulicity She was on Victoza (trial period). We changed from Victoza to Ozempic 08/2018. She refuses insulin.  Pt checks her sugars 1-2 times a day: - am:  110-150 >> 106, 109, 120-219 >> 90, 103-204, 208 - 2h after b'fast:n/c >> 115-205, 267 >> n/c >> 244 >> n/c - before lunch:176 >>  n/c >> 126 >> n/c >> 128, 161 - 2h after lunch: 165 >> n/c >> 189, 194 >> 139 >> n/c  >> 245-344 - before dinner: n/c >> 136-205 >> n/c >> 153  >> 101, 180-210 - 2h after dinner: 159 >> n/c >> 134 >> n/c >> 216-225 - bedtime: n/c >> 115-193, 206 >> n/c - nighttime: 85-142, 195 >> 73, 75, 214 >> n/c Lowest sugar was 83 >> 103 >> 106 >> 90; it is unclear at which level she has hypoglycemia awareness. Highest sugar was 256 >> 200 >> 219 >> 344.  Glucometer: FreeStyle Ultra mini >> ReliOn  Pt's meals are: - Breakfast: fruit, yoghurt, oatmeal, eggs, grits,  - Lunch: pasta, sandwich, salad - Dinner: meat + veggies - Snacks: 3  During the coronavirus pandemic she had to change her job and this was a stressful period for her. She still works from home, finishes late, around 8 PM so dinner is late.  -+ History of CKD, last BUN/creatinine:  03/27/2019: 17/1.01, GFR 72, glucose 128 Lab Results  Component Value Date   BUN 18 01/07/2018   CREATININE 0.97 01/07/2018   Lab Results  Component Value Date   GFRAA 82 01/07/2018    No recent microalbuminuria: 03/27/2019: ACR 21.2 Lab Results  Component Value Date   MICRALBCREAT 1.0 01/07/2018  ACR  (09/15/2014): 44.4 ACR (01/20/2014): 79.9 On ramipril.  -+ HL;  last set of lipids: 03/27/2019: 147/138/57/62 Lab Results  Component Value Date   CHOL 128 01/07/2018   HDL 49.60 01/07/2018   LDLCALC 61 01/07/2018   TRIG 90.0 01/07/2018   CHOLHDL 3 01/07/2018  03/31/2016: 136/69/49/73 06/12/2014:  147/118/44/80 On Lipitor 40.  - last eye exam was in 07/2019: No DR reportedly  -No numbness and tingling in her feet.  She has heel spurs.  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 She stopped Digoxin >> on Corlanor now. 2D echo by her cardiologist >> EF improved to 50 to 55%.  ROS: Constitutional: no weight gain/no weight loss, no fatigue, no subjective hyperthermia, no  subjective hypothermia Eyes: no blurry vision, no xerophthalmia ENT: no sore throat, no nodules palpated in neck, no dysphagia, no odynophagia, no hoarseness Cardiovascular: no CP/no SOB/no palpitations/no leg swelling Respiratory: no cough/no SOB/no wheezing Gastrointestinal: no N/no V/no D/no C/no acid reflux Musculoskeletal: no muscle aches/no joint aches Skin: no rashes, no hair loss Neurological: no tremors/no numbness/no tingling/no dizziness  I reviewed pt's medications, allergies, PMH, social hx, family hx, and changes were documented in the history of present illness. Otherwise, unchanged from my initial visit note.  Past Medical History:  Diagnosis Date  . Depression   . NICM (nonischemic cardiomyopathy) ([place])    2D ECHO, 12/26/2011 - EF 45-50%, left ventricle borderline dilated   Past Surgical History:  Procedure Laterality Date  . CARDIAC CATHETERIZATION  01/06/2002   Dilated nonsichemic cardiopmyopathy, EF 28%, at least 2+ angiographic mitral regurgitation   Social History   Social History  . Marital Status: Married    Spouse Name: N/A  . Number of Children: 1   Occupational History  .  claims benefits specialist    Social History Main Topics  . Smoking status: Never Smoker   . Smokeless tobacco: Not on file  . Alcohol Use: No  . Drug Use: No   Current Outpatient Medications on File Prior to Visit  Medication Sig Dispense Refill  . allopurinol (ZYLOPRIM) 100 MG tablet Take 1 tablet by mouth daily.    Marland Kitchen aspirin 325 MG tablet Take 325 mg by mouth daily.    Marland Kitchen atorvastatin (LIPITOR) 40 MG tablet Take 1 tablet (40 mg total) by mouth daily at 6 PM. 90 tablet 3  . carvedilol (COREG) 25 MG tablet Take 1 tablet (25 mg total) by mouth 2 (two) times daily with a meal. TAKE 1 TABLET BY MOUTH TWICE DAILY WITH A MEAL 180 tablet 3  . diphenhydrAMINE (BENADRYL) 25 MG tablet Take 25 mg by mouth daily.    Marland Kitchen FARXIGA 10 MG TABS tablet TAKE 10 MG BY MOUTH DAILY. 90 tablet 3  .  glipiZIDE (GLUCOTROL XL) 5 MG 24 hr tablet TAKE 2 TABLETS BY MOUTH BEFORE BREAKFAST AND 1/2 TABLET BY MOUTH BEFORE DINNER 225 tablet 3  . hydrochlorothiazide (HYDRODIURIL) 25 MG tablet Take 1 tablet (25 mg total) by mouth daily. 90 tablet 3  . hydrocortisone 2.5 % cream as needed.    . Insulin Pen Needle (BD PEN NEEDLE MICRO U/F) 32G X 6 MM MISC Use with Victoza pen daily 100 each 2  . ivabradine (CORLANOR) 5 MG TABS tablet Take 1 tablet (5 mg total) by mouth 2 (two) times daily with a meal. 180 tablet 3  . metFORMIN (GLUCOPHAGE) 1000 MG tablet TAKE 1 TABLET BY MOUTH 2 TIMES DAILY WITH A MEAL. 180 tablet 3  . ramipril (ALTACE) 5 MG capsule Take 2 capsules by mouth in the morning and 1 capsule in the evening. 270 capsule 3  . Semaglutide, 1 MG/DOSE, (OZEMPIC, 1 MG/DOSE,) 2 MG/1.5ML SOPN Inject 1 mg into the skin once a week.  6 pen 5  . spironolactone (ALDACTONE) 25 MG tablet Take 0.5 tablets (12.5 mg total) by mouth daily. 30 tablet 0   No current facility-administered medications on file prior to visit.   Allergies  Allergen Reactions  . Betadine [Povidone Iodine]   . Iodine   . Peanuts [Peanut Oil]   . Shellfish Allergy   . Other Hives and Rash    Sunlight and Grass    Family History  Problem Relation Age of Onset  . Hypertension Mother   . GI Bleed Mother   . Pneumonia Maternal Grandmother   . Cancer Paternal Grandmother        Breast cancer  . Breast cancer Paternal Grandmother    PE: BP 130/86   Pulse 81   Ht 5\' 3"  (1.6 m)   Wt 201 lb (91.2 kg)   SpO2 94%   BMI 35.61 kg/m  Body mass index is 35.61 kg/m. Wt Readings from Last 3 Encounters:  11/21/19 201 lb (91.2 kg)  05/23/19 206 lb (93.4 kg)  04/29/19 204 lb (92.5 kg)   Constitutional: overweight, in NAD Eyes: PERRLA, EOMI, no exophthalmos ENT: moist mucous membranes, no thyromegaly, no cervical lymphadenopathy Cardiovascular: RRR, No MRG Respiratory: CTA B Gastrointestinal: abdomen soft, NT, ND,  BS+ Musculoskeletal: no deformities, strength intact in all 4 Skin: moist, warm, no rashes Neurological: no tremor with outstretched hands, DTR normal in all 4  ASSESSMENT: 1. DM2, non-insulin-dependent, uncontrolled, with complications - CKD  Cardiologist: Dr 06/29/19  2. Obesity class 2 BMI Classification:  < 18.5 underweight   18.5-24.9 normal weight   25.0-29.9 overweight   30.0-34.9 class I obesity   35.0-39.9 class II obesity   ? 40.0 class III obesity   3. HL  PLAN:  1. Patient with longstanding, uncontrolled, type 2 diabetes, on oral antidiabetic regimen with Metformin, sulfonylurea, and then she does not need to be set, and also weekly GLP-1 receptor agonist, with the dose increased at last visit. Of note, she refused insulin in the past. At last visit, sugars were worse and they were very fluctuating in the morning but mostly above target. This was happening because she was finishing work late and eating dinner after 8 PM. We discussed that if she is eating dinner late, this has to be very light, maybe only an apple or a bowl of berries. She again refused insulin at last visit. I suggested to increase Ozempic to 1 mg weekly and continue the rest of the regimen. She was only checking in the morning at that time and I advised her to also check sugars later in the day. -At this visit, sugars are still above target in the morning and they increase throughout the day.  After lunch, they tend to reach up into the 300s.  We discussed that I would recommend insulin since she is already maximized on the rest of her medicines.  However, she refuses.  In that case, she will need a drastic change in diet to improve her blood sugars.  I suggested a plant-based diet.  Explained the concept,  06-23-1990, I referred her to nutrition. - I suggested to:  Patient Instructions  Please continue: - Metformin 1000 mg 2x a day, with meals - Glipizide ER XL 10 mg before breakfast and 5 mg before  dinner - Farxiga 10 mg in am  - Ozempic 1 mg weekly  Please schedule an appt with Thelma Barge with nutrition.  Please return in 3 months with your sugar log.   -  Please review the following diet suggestions and look at the recommended reading list - Do not drink your calories (sodas of any kind, sweet tea, or juice). Substitute with fresh fruit-flavored water. - Eat low glycemic index foods. Avoid highly-processed sugars.  - I suggest a whole food plant-based diet (please see patient instructions).  Next best thing is probably a Mediterranean diet. - Exercise - any level of activity during the day, even walking. - Go to bed early, if you can - Try not to eat when it's dark outside  Etc. (please see patient instructions)  - we checked her HbA1c: 7.6% (stable, however, lower than expected from her log) - advised to check sugars at different times of the day - 1x a day, rotating check times - advised for yearly eye exams >> she is UTD - return to clinic in 3-4 months        2. Obesity class 2 -She is walking for exercise - continue to show stability with her GLP-1 receptor agonist which should also help with weight loss -She lost 5 pounds since last visit -We discussed about switching to a plant-based diet which will not only help with her diabetes but also with weight loss  3. HL -Reviewed latest lipid panel from 03/2019: All fractions at goal -Continues Lipitor without side effects  Carlus Pavlov, MD PhD [facility]

## 2020-01-15 ENCOUNTER — Ambulatory Visit: Payer: 59 | Admitting: Dietician

## 2020-01-28 ENCOUNTER — Encounter: Payer: No Typology Code available for payment source | Attending: Internal Medicine | Admitting: Nutrition

## 2020-01-28 ENCOUNTER — Other Ambulatory Visit: Payer: Self-pay

## 2020-01-28 DIAGNOSIS — E1129 Type 2 diabetes mellitus with other diabetic kidney complication: Secondary | ICD-10-CM

## 2020-01-28 DIAGNOSIS — R809 Proteinuria, unspecified: Secondary | ICD-10-CM | POA: Diagnosis present

## 2020-01-29 NOTE — Patient Instructions (Addendum)
1.  Stop snacking on fruit.  Limit fruit to 3 times/day 2.  Breakfast suggestions:Yogurt breakfast is good, but limit fruit to 1/2 cup.  If oatmeal, stop brown sugar, add cinnamon, and stop raisens.  Add handfull of nuts for protein and fat-to help with feeling full longer and slower blood sugar rise.  Add whole wheat bread to eggs, and reduce fat when frying by using a spray.   3.  Stop all fruit juices and switch to noncaloric flavored waters.   4. Continue the exercise and to increase intensity if weight loss plateaus 5. Test blood sugar around one meal each day-before and 2hr. Pc.

## 2020-01-29 NOTE — Progress Notes (Addendum)
Patient was identified by name and DOB.  She is here today to help her with weight loss and better blood sugar control.  She reports having changed her diet since seeing Dr. Elvera Lennox on 4/30.  Present  meal plan: SBGM:  Fasting only.  Says FBSs are all less than 80, "58-80".  She did not bring meter, and denies low blood sugar symptoms.  Suggested she review expiration dates on test strips and keep detailed record of any recorded blood sugars below 70.  Also stressed need to call Dr. Charlean Sanfilippo office if more than 1 low/wk.  She agreed to do this. Exercise: walks for 30 min.  5-7 days/wk. Typical day: 4:45 up. Works from home.  Starts at 5AM 6AM: tests blood sugars 7AM: low sugar, lite greek yogurt. With granola, frozen blue berries and strawberries.or Oatmeal with raisins brown sugar and applies, or 1-2 scrambled eggs and cheese, with fruit.  Water to drink, or lite fruit juice 9-10AM: fresh fruit: banana, apple, or grapes 10-11AM: Lunch:  Leftovers from the night before:(chicken/fish/pork with veg.  Trys to have no starchy veg.), no bread, fruit, or salad with fruit and ranch dressing 1PM: fruit as above or atkins or slim fast drinks. 5PM: supper: meat is ususally baked-with approx. 3-5 ounces, fried food only on Sundays with non starchy veg.   Water to drink, or lite fruit juice 7PM: snack of fruit.  No food after 8PM 9PM: bed.   Discussion: 1. Discussed idea of a balanced meal with all 3 major food groups to help blood sugar control and hunger.  Identified foods in each of the 3 food groups, and the need for all three at each meal, but limiting quantities at each meal.  Also the importance of fiber in feeling full, and slowing blood sugar rise. 2.  Discussed the importance of seeing if her body can handle foods she is eating, buy testing blood sugar 2hr. pc one meal each day.  Goals for 2hr. Pc: less than 160.   3.  Discussed type II diabetes and why the insulin resisitance, and ways to decrease  insulin resisitance with exercise, lower blood sugars and waist loss size.  She reported good understanding of each of the above 4.  Discussed the fact that fats contain large amounts of calories, and the need to limit them at each meal: ie: ranch dressing, mayo, sour cream, cream cheese etc. Suggestions given:  1.  Stop snacking on fruit.  Limit fruit to 3 times/day 2.  Breakfast suggestions:Yogurt breakfast is good, but limit fruit to 1/2 cup.  If oatmeal, stop brown sugar, add cinnamon, and stop raisens.  Add handfull of nuts for protein and fat-to help with feeling full longer and slower blood sugar rise.  Add whole wheat bread to eggs, and reduce fat when frying by using a spray.   3.  Stop all fruit juices and switch to noncaloric flavored waters.   4.  Commended her on the dietary changes made, and the daily exercise.  Encouraged her to continue the exercise and to increase intensity if weight loss plateaus.   5. Test blood sugar around one meal each day-before and 2hr. pc.  Handouts given for snacks containing less than 15 grams of carbs, other breakfast choices that have 30 grams of carb and 7 grams of protein Handout given on imporance of balanced meals, label reading, and 1500 calorie meal plan 30 grams of carb per meal, with 1 serving protein at breakfast, 2 at lunch and  3 at supper.  Fat is limited to 1 serving per meal

## 2020-02-11 IMAGING — MG DIGITAL SCREENING BILATERAL MAMMOGRAM WITH TOMO AND CAD
8 series · 8 of 24 positions shown · non-contrast
Comparison: Previous exam(s).

CLINICAL DATA: Screening.

EXAM:
DIGITAL SCREENING BILATERAL MAMMOGRAM WITH TOMO AND CAD

[R MLO synth-2D]
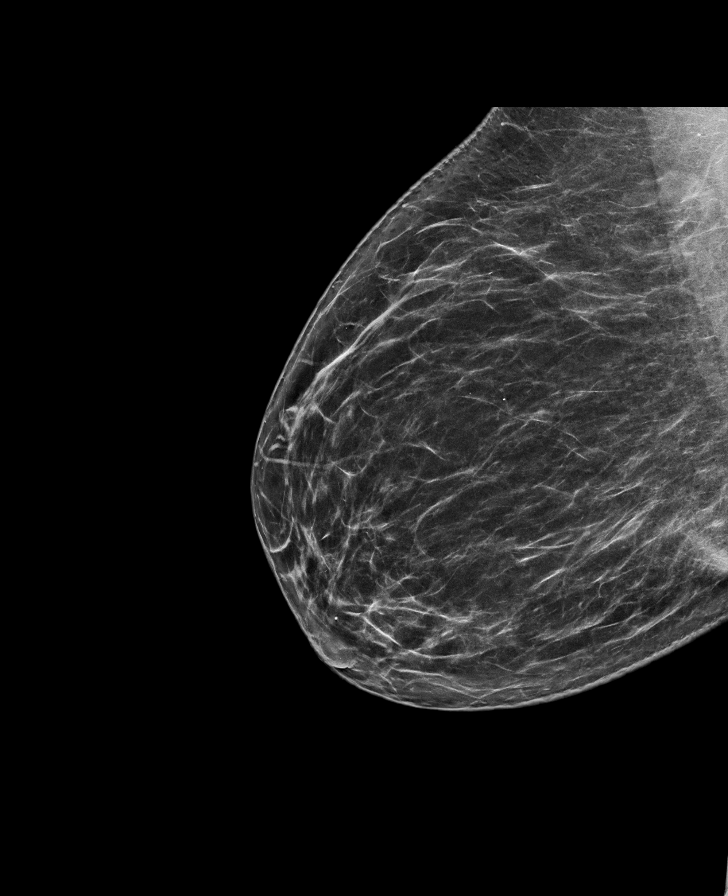

[R CC synth-2D]
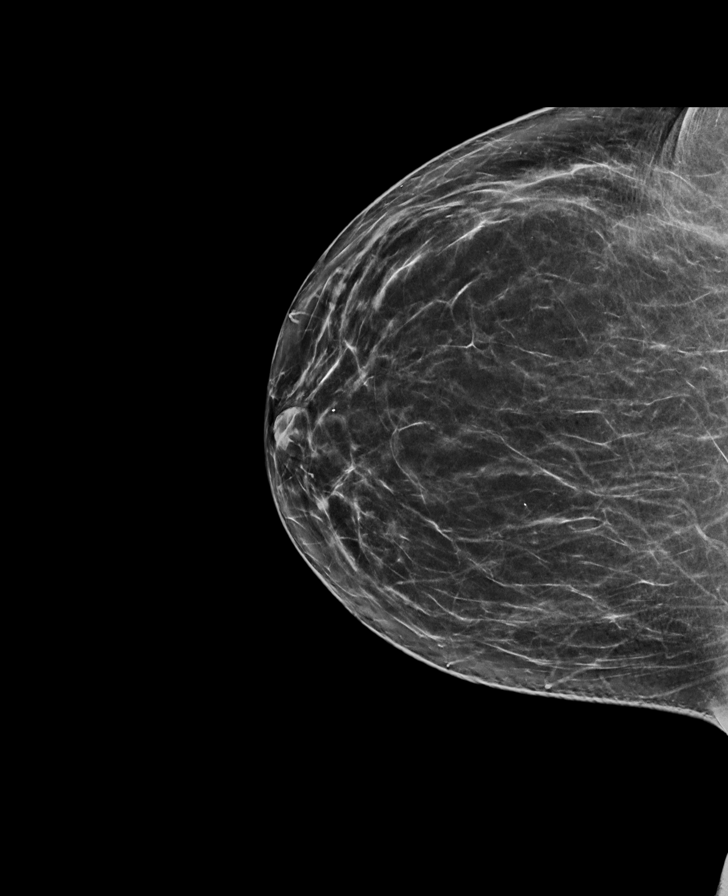

[L CC synth-2D]
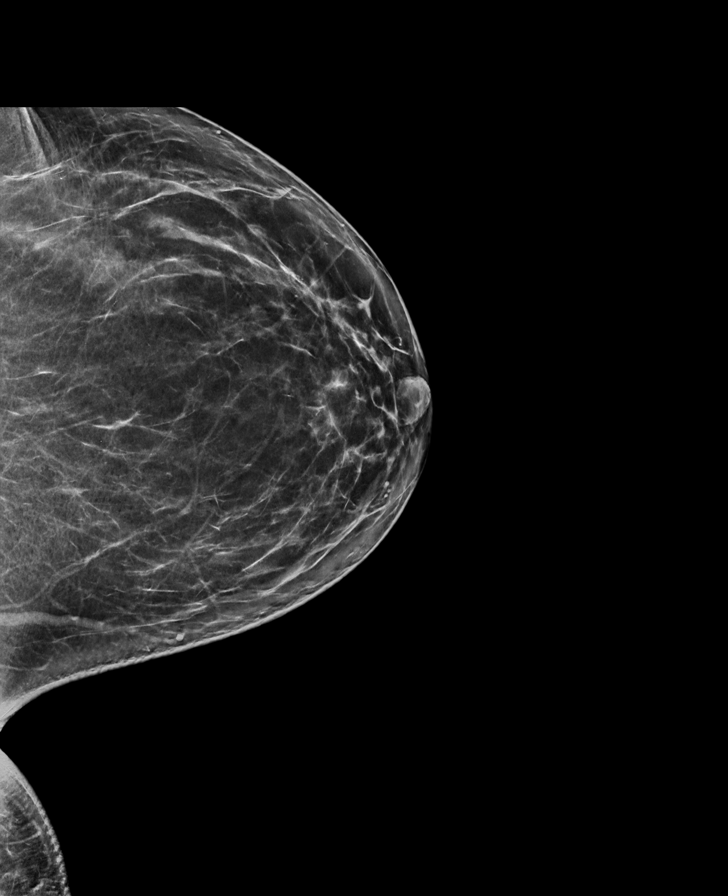

[L MLO synth-2D]
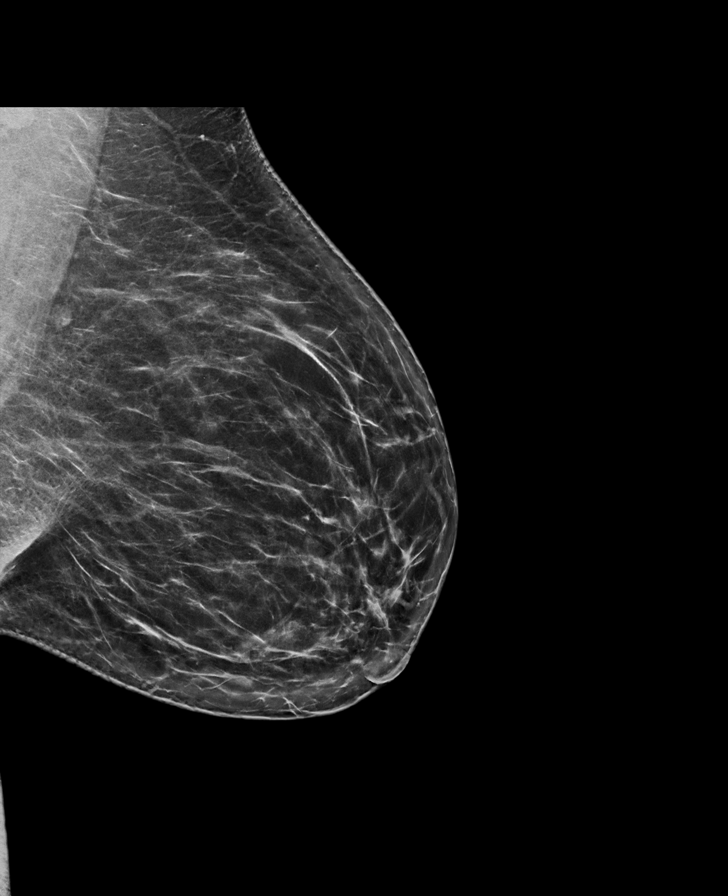

[R MLO tomo · tomo slice 41/81.0]
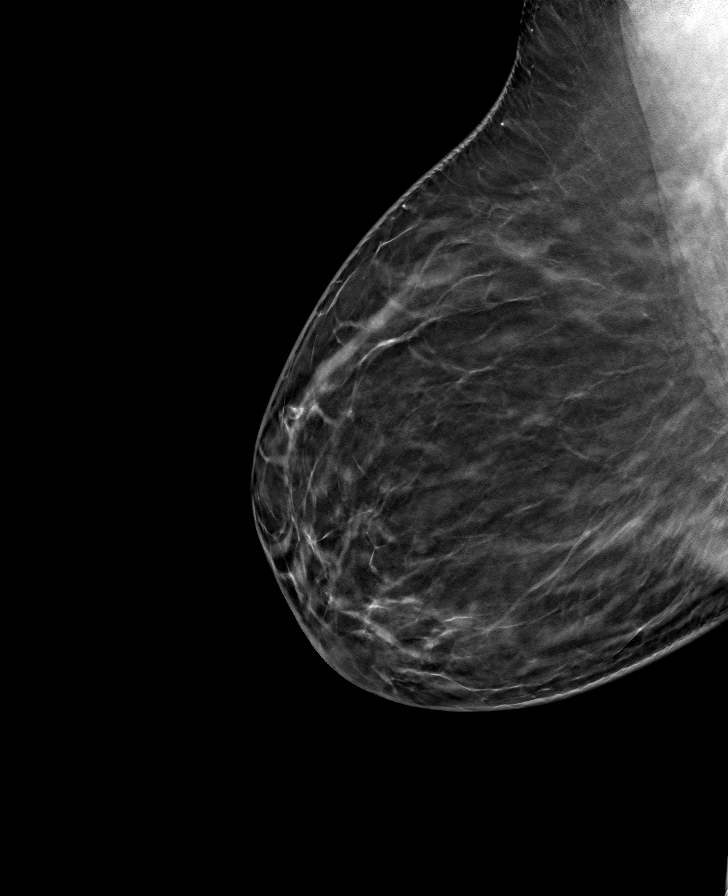

[R CC tomo · tomo slice 38/75.0]
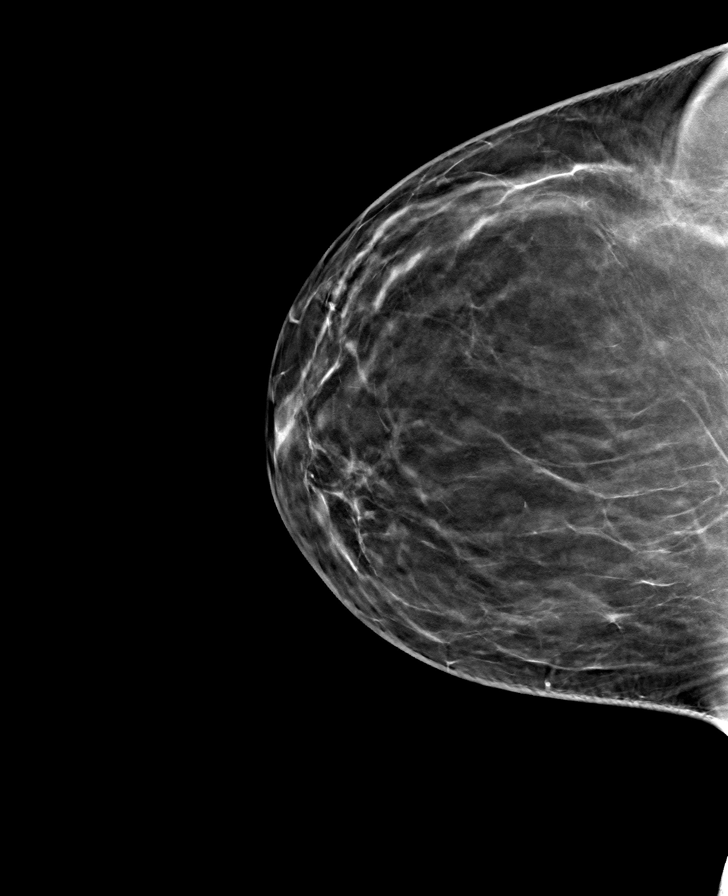

[L MLO tomo · tomo slice 43/84.0]
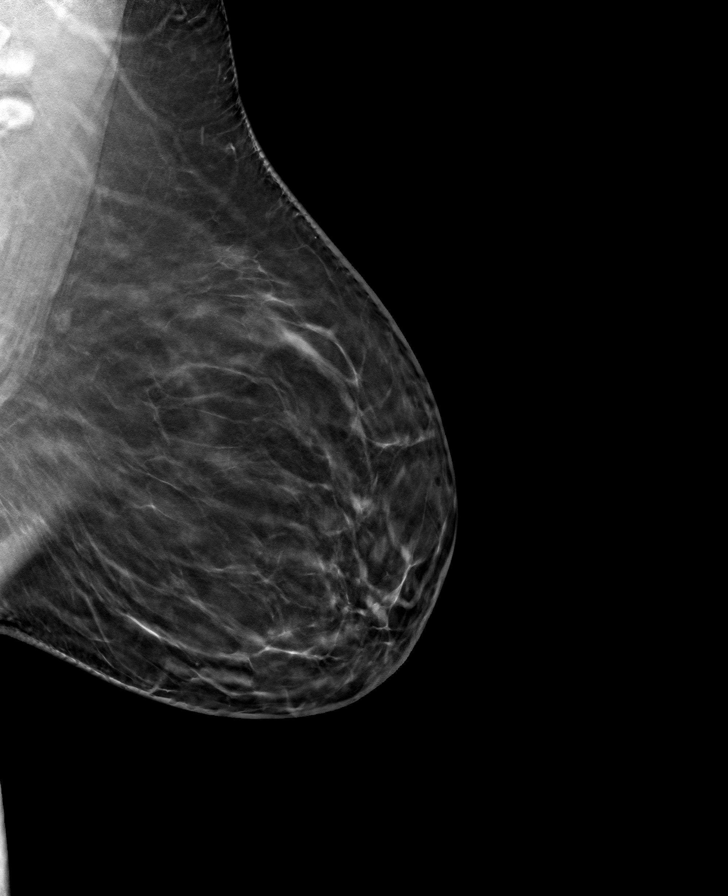

[L CC tomo · tomo slice 38/75.0]
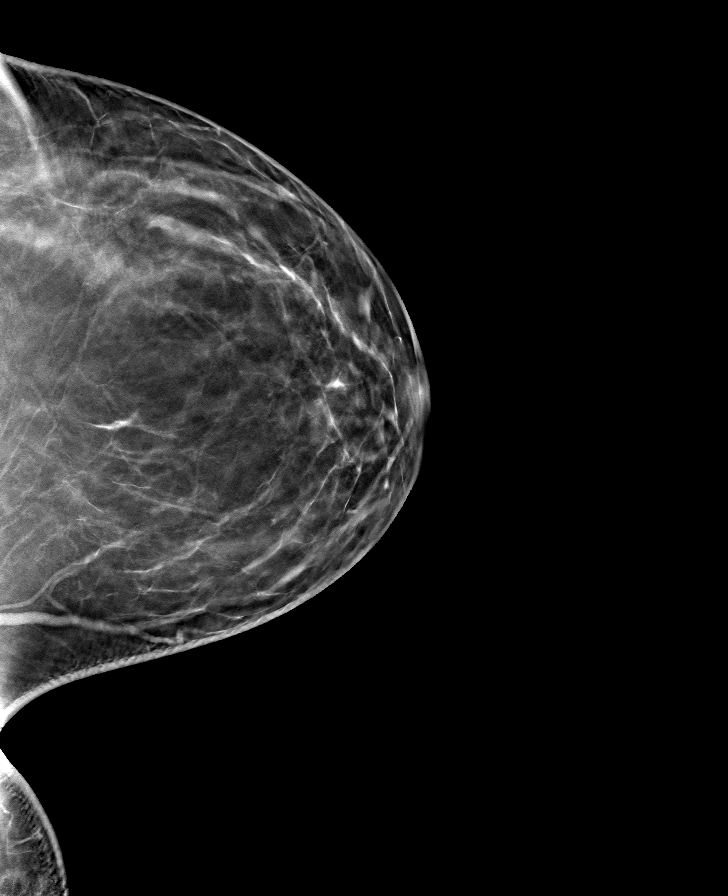

[8 of 24 positions shown; findings below may reference images not displayed]

ACR Breast Density Category b: There are scattered areas of
fibroglandular density.
FINDINGS: There are no findings suspicious for malignancy. Images were
processed with CAD.
IMPRESSION: No mammographic evidence of malignancy. A result letter of this
screening mammogram will be mailed directly to the patient.

RECOMMENDATION:
Screening mammogram in one year. (Code:[id number])

BI-RADS CATEGORY  1: Negative.

## 2020-03-17 ENCOUNTER — Other Ambulatory Visit: Payer: Self-pay | Admitting: Physician Assistant

## 2020-03-26 ENCOUNTER — Other Ambulatory Visit: Payer: Self-pay

## 2020-03-26 ENCOUNTER — Encounter: Payer: Self-pay | Admitting: Internal Medicine

## 2020-03-26 ENCOUNTER — Ambulatory Visit (INDEPENDENT_AMBULATORY_CARE_PROVIDER_SITE_OTHER): Payer: No Typology Code available for payment source | Admitting: Internal Medicine

## 2020-03-26 VITALS — BP 130/80 | HR 75 | Ht 63.0 in | Wt 198.0 lb

## 2020-03-26 DIAGNOSIS — E1129 Type 2 diabetes mellitus with other diabetic kidney complication: Secondary | ICD-10-CM | POA: Diagnosis not present

## 2020-03-26 DIAGNOSIS — E785 Hyperlipidemia, unspecified: Secondary | ICD-10-CM

## 2020-03-26 DIAGNOSIS — R809 Proteinuria, unspecified: Secondary | ICD-10-CM | POA: Diagnosis not present

## 2020-03-26 DIAGNOSIS — E669 Obesity, unspecified: Secondary | ICD-10-CM

## 2020-03-26 LAB — LIPID PANEL
Cholesterol: 133 mg/dL (ref 0–200)
HDL: 54.3 mg/dL (ref >39.00)
LDL Cholesterol: 56 mg/dL (ref 0–99)
NonHDL: 78.3
Total CHOL/HDL Ratio: 2
Triglycerides: 114 mg/dL (ref 0.0–149.0)
VLDL: 22.8 mg/dL (ref 0.0–40.0)

## 2020-03-26 LAB — POCT GLYCOSYLATED HEMOGLOBIN (HGB A1C): Hemoglobin A1C: 7.4 % — AB (ref 4.0–5.6)

## 2020-03-26 LAB — MICROALBUMIN / CREATININE URINE RATIO
Creatinine,U: 141.1 mg/dL
Microalb Creat Ratio: 2.5 mg/g (ref 0.0–30.0)
Microalb, Ur: 3.6 mg/dL — ABNORMAL HIGH (ref 0.0–1.9)

## 2020-03-26 MED ORDER — GLIPIZIDE ER 5 MG PO TB24
ORAL_TABLET | ORAL | 3 refills | Status: DC
Start: 2020-03-26 — End: 2020-09-28

## 2020-03-26 NOTE — Patient Instructions (Addendum)
Please continue: - Metformin 1000 mg 2x a day, with meals - Glipizide ER XL 10 mg before breakfast and 2.5 mg before dinner - Farxiga 10 mg in am  - Ozempic 1 mg weekly  Please return in 3-4 months with your sugar log.

## 2020-03-26 NOTE — Progress Notes (Signed)
Patient ID: Laurie Small, female   DOB: [birth year], 44 y.o.   MRN: [id number]  This visit occurred during the SARS-CoV-2 public health emergency.  Safety protocols were in place, including screening questions prior to the visit, additional usage of staff PPE, and extensive cleaning of exam room while observing appropriate contact time as indicated for disinfecting solutions.   HPI: Laurie Small is a 44 y.o.-year-old female, presenting for f/u for DM2, dx in 2003 (after an episode of myocarditis), non-insulin-dependent, uncontrolled, with complications (microalbuminuria). Last visit 46 months ago.  She saw Cristy Folks  for nutrition advice since last visit.  She started to adopt the dietary changes and her sugars improved.  Reviewed HbA1c levels: Lab Results  Component Value Date   HGBA1C 7.6 (A) 11/21/2019   HGBA1C 7.6 (A) 05/23/2019   HGBA1C 8.1 (A) 05/20/2018  05/23/2019: HbA1c calculated from fructosamine is 5.77%, in this case, however, discrepant with the sugars at home. 05/20/2018: HbA1c calculated from the fructosamine is 6.27%, still excellent. 01/07/2018: HbA1c calculated from fructosamine is 6.1% (great, slightly higher than before!) 03/22/2017: HbA1c calculated from fructosamine is 5.9%!  12/14/2016: HbA1c calculated from fructosamine is 6.6%. 09/14/2016: HbA1c calculated from fructosamine is MUCH lower, at 6.8%! 05/31/2015: HbA1c 10% 09/15/2014: HbA1c 9.6% 04/23/2014: HbA1c 10.9% 01/20/2014: HbA1c 12.9%  Pt is on a regimen of: - Metformin 1000 mg 2x a day, with meals - Glipizide ER XL 10 mg before breakfast and 2.5 mg before dinner - Farxiga 10 mg in am - Ozempic 0.5 >> 1 mg weekly  She was on Onglyza (too expensive).  She was on JanuMet (stopped being effective), then Januvia >> stopped when starting Trulicity She was on Victoza (trial period). We changed from Victoza to Ozempic 08/2018. She refuses insulin.  Pt checks her sugars 1-2 times a day: - am:   110-150 >> 106, 109, 120-219 >> 90, 103-204, 208 >> 58, 66-148, 162, 212 (forgot meds) -improved in last month - 2h after b'fast:n/c >> 115-205, 267 >> n/c >> 244 >> n/c >> 150, 184, 241  - before lunch:176 >> n/c >> 126 >> n/c >> 128, 161 >> 59, 127-162 - 2h after lunch: 165 >> n/c >> 189, 194 >> 139 >> n/c  >> 245-344 >> 87, 243 - before dinner: n/c >> 136-205 >> n/c >> 153  >> 101, 180-210 >> 139 - 2h after dinner: 159 >> n/c >> 134 >> n/c >> 216-225 >> 184 - bedtime: n/c >> 115-193, 206 >> n/c - nighttime: 85-142, 195 >> 73, 75, 214 >> n/c Lowest sugar was 83 >> 103 >> 106 >> 90 >> 58 ; it is unclear at which level she has hypoglycemia awareness. Highest sugar was 256 >> 200 >> 219 >> 344 >> 243.  Glucometer: FreeStyle Ultra mini >> ReliOn  Pt's meals are: - Breakfast: fruit, yoghurt, oatmeal, eggs, grits,  - Lunch: pasta, sandwich, salad - Dinner: meat + veggies - Snacks: 3  During the coronavirus pandemic she had to change her job and this was a stressful period for her. She still works from home, finishes late, around 8 PM, so dinner is late.  -+ History of CKD, last BUN/creatinine:  03/27/2019: 17/1.01, GFR 72, glucose 128 Lab Results  Component Value Date   BUN 18 01/07/2018   CREATININE 0.97 01/07/2018   Lab Results  Component Value Date   GFRAA 82 01/07/2018    No recent microalbuminuria: 03/27/2019: ACR 21.2 Lab Results  Component Value Date   MICRALBCREAT 1.0 01/07/2018  ACR  (09/15/2014): 44.4 ACR (01/20/2014): 79.9 On ramipril.  -+ HL; last set of lipids: 03/27/2019: 147/138/57/62 Lab Results  Component Value Date   CHOL 128 01/07/2018   HDL 49.60 01/07/2018   LDLCALC 61 01/07/2018   TRIG 90.0 01/07/2018   CHOLHDL 3 01/07/2018  03/31/2016: 136/69/49/73 06/12/2014: 147/118/44/80 On Lipitor 40.  - last eye exam was in 07/2019: No DR reportedly  -She denies numbness and tingling in her feet.  She has heel spurs.  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  She stopped Digoxin >> on Corlanor now. 2D echo by her cardiologist >> EF improved to 50 to 55%.  ROS: Constitutional: no weight gain/no weight loss, no fatigue, no subjective hyperthermia, no subjective hypothermia Eyes: no blurry vision, no xerophthalmia ENT: no sore throat, no nodules palpated in neck, no dysphagia, no odynophagia, no hoarseness Cardiovascular: no CP/no SOB/no palpitations/no leg swelling Respiratory: no cough/no SOB/no wheezing Gastrointestinal: no N/no V/no D/no C/no acid reflux Musculoskeletal: no muscle aches/no joint aches Skin: no rashes, no hair loss Neurological: no tremors/no numbness/no tingling/no dizziness  I reviewed pt's medications, allergies, PMH, social hx, family hx, and changes were documented in the history of present illness. Otherwise, unchanged from my initial visit note.  Past Medical History:  Diagnosis Date  . Depression   . NICM (nonischemic cardiomyopathy) (HCC)    2D ECHO, 12/26/2011 - EF 45-50%, left ventricle borderline dilated   Past Surgical History:  Procedure Laterality Date  . CARDIAC CATHETERIZATION  01/06/2002   Dilated nonsichemic cardiopmyopathy, EF 28%, at least 2+ angiographic mitral regurgitation   Social History   Social History  . Marital Status: Married    Spouse Name: N/A  . Number of Children: 1   Occupational History  .  claims benefits specialist    Social History Main Topics  . Smoking status: Never Smoker   . Smokeless tobacco: Not on file  . Alcohol Use: No  . Drug Use: No   Current Outpatient Medications on File Prior to Visit  Medication Sig Dispense Refill  . allopurinol (ZYLOPRIM) 100 MG tablet Take 1 tablet by mouth daily.    Marland Kitchen aspirin 325 MG tablet Take 325 mg by mouth daily.    Marland Kitchen atorvastatin (LIPITOR) 40 MG tablet Take 1 tablet (40 mg total) by mouth daily at 6 PM. 90 tablet 3  . carvedilol (COREG) 25 MG tablet TAKE 1  TABLET BY MOUTH 2 TIMES DAILY WITH A MEAL 180 tablet 1  . diphenhydrAMINE (BENADRYL) 25 MG tablet Take 25 mg by mouth daily.    Marland Kitchen FARXIGA 10 MG TABS tablet TAKE 10 MG BY MOUTH DAILY. 90 tablet 3  . glipiZIDE (GLUCOTROL XL) 5 MG 24 hr tablet TAKE 2 TABLETS BY MOUTH BEFORE BREAKFAST AND 1/2 TABLET BY MOUTH BEFORE DINNER 225 tablet 3  . hydrochlorothiazide (HYDRODIURIL) 25 MG tablet Take 1 tablet (25 mg total) by mouth daily. 90 tablet 3  . hydrocortisone 2.5 % cream as needed.    . Insulin Pen Needle (BD PEN NEEDLE MICRO U/F) 32G X 6 MM MISC Use with Victoza pen daily 100 each 2  . ivabradine (CORLANOR) 5 MG TABS tablet Take 1 tablet (5 mg total) by mouth 2 (two) times daily with a meal. 180 tablet 3  . metFORMIN (GLUCOPHAGE) 1000 MG tablet TAKE 1 TABLET BY MOUTH 2 TIMES DAILY WITH A MEAL. 180 tablet 3  . ramipril (  ALTACE) 5 MG capsule TAKE 2 CAPSULES BY MOUTH IN THE MORNING AND 1 CAPSULE IN THE EVENING. 270 capsule 1  . Semaglutide, 1 MG/DOSE, (OZEMPIC, 1 MG/DOSE,) 2 MG/1.5ML SOPN Inject 1 mg into the skin once a week. 6 pen 5  . spironolactone (ALDACTONE) 25 MG tablet Take 0.5 tablets (12.5 mg total) by mouth daily. 30 tablet 0   No current facility-administered medications on file prior to visit.   Allergies  Allergen Reactions  . Betadine [Povidone Iodine]   . Iodine   . Peanuts [Peanut Oil]   . Shellfish Allergy   . Other Hives and Rash    Sunlight and Grass    Family History  Problem Relation Age of Onset  . Hypertension Mother   . GI Bleed Mother   . Pneumonia Maternal Grandmother   . Cancer Paternal Grandmother        Breast cancer  . Breast cancer Paternal Grandmother    PE: BP 130/80   Pulse 75   Ht 5\' 3"  (1.6 m)   Wt 198 lb (89.8 kg)   SpO2 98%   BMI 35.07 kg/m  Body mass index is 35.07 kg/m. Wt Readings from Last 3 Encounters:  03/26/20 198 lb (89.8 kg)  11/21/19 201 lb (91.2 kg)  05/23/19 206 lb (93.4 kg)   Constitutional: overweight, in NAD Eyes: PERRLA,  EOMI, no exophthalmos ENT: moist mucous membranes, no thyromegaly, no cervical lymphadenopathy Cardiovascular: RRR, No MRG Respiratory: CTA B Gastrointestinal: abdomen soft, NT, ND, BS+ Musculoskeletal: no deformities, strength intact in all 4 Skin: moist, warm, no rashes Neurological: no tremor with outstretched hands, DTR normal in all 4  ASSESSMENT: 1. DM2, non-insulin-dependent, uncontrolled, with complications - CKD  Cardiologist: Dr 05/25/19  2. Obesity class 2 BMI Classification:  < 18.5 underweight   18.5-24.9 normal weight   25.0-29.9 overweight   30.0-34.9 class I obesity   35.0-39.9 class II obesity   ? 40.0 class III obesity   3. HL  PLAN:  1. Patient with longstanding, uncontrolled, type 2 diabetes, on oral antidiabetic regimen with Metformin, sulfonylurea, and SGLT2 inhibitor, and also on weekly GLP-1 receptor agonist, with still suboptimal control.  At last visit, HbA1c was 7.6%, stable, still above target.  At that time, sugars were still above target in the morning and they were increasing throughout the day.  After lunch, they were even reaching up to 300s.  We discussed that I would recommend insulin since she was already maximized on the rest of her medicines.  However, she refused.  In that case, we discussed that she needed a drastic change in her diet to improve her blood sugars.  I suggested a plant-based diet.  She agreed to try this. -At this visit, she returned after starting made dietary changes in the last month with improvement in her sugars since then.  Her sugars are now mostly at goal in the morning with few exceptions, and I advised her to write possible reasons for the high or low blood sugars in her log.  She is now checking her sugars later in the day for the last month and I strongly advised her to do so.  I did review her daytime blood sugars from 2 to 3 months ago and they were still slightly elevated but these were checked before she actually  changed her diet. -For now, I advised her to continue the current diet and would not change her regimen. - I suggested to:  Patient Instructions  Please continue: -  Metformin 1000 mg 2x a day, with meals - Glipizide ER XL 10 mg before breakfast and 2.5 mg before dinner - Farxiga 10 mg in am  - Ozempic 1 mg weekly  Please return in 3-4 months with your sugar log.  - we checked her HbA1c: 7.4% (slightly better) - advised to check sugars at different times of the day - 1-2x a day, rotating check times - advised for yearly eye exams >> she is UTD - will check annual labs today - return to clinic in 3-4 months        2. Obesity class 2 -She continues to walk for exercise -She lost 5 pounds before last visit -Continues on the SGLT2 inhibitor and GLP-1 receptor agonist, which should also help with weight loss -At last visit I also recommended to switch to a more plant-based diet to help with weight loss -She started to adopt dietary changes since last visit and lost 3 pounds.  3. HL - Reviewed latest lipid panel from 2020: All fractions at goal - Continues Lipitor without side effects.  Component     Latest Ref Rng & Units 03/26/2020  Glucose     65 - 99 mg/dL 754 (H)  BUN     7 - 25 mg/dL 22  Creatinine     4.92 - 1.10 mg/dL 0.10 (H)  GFR, Est Non African American     > OR = 60 mL/min/1.81m2 55 (L)  GFR, Est African American     > OR = 60 mL/min/1.30m2 63  BUN/Creatinine Ratio     6 - 22 (calc) 18  Sodium     135 - 146 mmol/L 138  Potassium     3.5 - 5.3 mmol/L 5.0  Chloride     98 - 110 mmol/L 99  CO2     20 - 32 mmol/L 26  Calcium     8.6 - 10.2 mg/dL 9.8  Total Protein     6.1 - 8.1 g/dL 7.3  Albumin MSPROF     3.6 - 5.1 g/dL 4.6  Globulin     1.9 - 3.7 g/dL (calc) 2.7  AG Ratio     1.0 - 2.5 (calc) 1.7  Total Bilirubin     0.2 - 1.2 mg/dL 0.3  Alkaline phosphatase (APISO)     31 - 125 U/L 51  AST     10 - 35 U/L 21  ALT     6 - 29 U/L 18  Cholesterol      0 - 200 mg/dL 071  Triglycerides     0 - 149 mg/dL 219.7  HDL Cholesterol     >39.00 mg/dL 58.83  VLDL     0.0 - 25.4 mg/dL 98.2  LDL (calc)     0 - 99 mg/dL 56  Total CHOL/HDL Ratio      2  NonHDL      78.30  Microalb, Ur     0.0 - 1.9 mg/dL 3.6 (H)  Creatinine,U     mg/dL 641.5  MICROALB/CREAT RATIO     0.0 - 30.0 mg/g 2.5   Kidney function is slightly lower, but for now I would suggest that she stays well-hydrated and will recheck at next visit.  Carlus Pavlov, MD PhD [facility] Endocrinology

## 2020-03-27 LAB — COMPLETE METABOLIC PANEL WITH GFR
AG Ratio: 1.7 (calc) (ref 1.0–2.5)
ALT: 18 U/L (ref 6–29)
AST: 21 U/L (ref 10–35)
Albumin: 4.6 g/dL (ref 3.6–5.1)
Alkaline phosphatase (APISO): 51 U/L (ref 31–125)
BUN/Creatinine Ratio: 18 (calc) (ref 6–22)
BUN: 22 mg/dL (ref 7–25)
CO2: 26 mmol/L (ref 20–32)
Calcium: 9.8 mg/dL (ref 8.6–10.2)
Chloride: 99 mmol/L (ref 98–110)
Creat: 1.19 mg/dL — ABNORMAL HIGH (ref 0.50–1.10)
GFR, Est African American: 63 mL/min/{1.73_m2} (ref >=60)
GFR, Est Non African American: 55 mL/min/{1.73_m2} — ABNORMAL LOW (ref >=60)
Globulin: 2.7 g/dL (calc) (ref 1.9–3.7)
Glucose, Bld: 156 mg/dL — ABNORMAL HIGH (ref 65–99)
Potassium: 5 mmol/L (ref 3.5–5.3)
Sodium: 138 mmol/L (ref 135–146)
Total Bilirubin: 0.3 mg/dL (ref 0.2–1.2)
Total Protein: 7.3 g/dL (ref 6.1–8.1)

## 2020-04-12 ENCOUNTER — Other Ambulatory Visit: Payer: Self-pay | Admitting: Physician Assistant

## 2020-05-02 ENCOUNTER — Other Ambulatory Visit: Payer: Self-pay | Admitting: Physician Assistant

## 2020-05-03 NOTE — Telephone Encounter (Signed)
Rx has been sent to the pharmacy electronically. ° °

## 2020-05-16 ENCOUNTER — Other Ambulatory Visit: Payer: Self-pay | Admitting: Physician Assistant

## 2020-06-02 ENCOUNTER — Ambulatory Visit (INDEPENDENT_AMBULATORY_CARE_PROVIDER_SITE_OTHER): Payer: No Typology Code available for payment source | Admitting: Internal Medicine

## 2020-06-02 ENCOUNTER — Other Ambulatory Visit: Payer: Self-pay

## 2020-06-02 ENCOUNTER — Encounter: Payer: Self-pay | Admitting: Internal Medicine

## 2020-06-02 VITALS — BP 122/68 | HR 75 | Ht 62.5 in | Wt 198.8 lb

## 2020-06-02 DIAGNOSIS — I428 Other cardiomyopathies: Secondary | ICD-10-CM

## 2020-06-02 DIAGNOSIS — I10 Essential (primary) hypertension: Secondary | ICD-10-CM | POA: Diagnosis not present

## 2020-06-02 DIAGNOSIS — R000 Tachycardia, unspecified: Secondary | ICD-10-CM

## 2020-06-02 MED ORDER — ATORVASTATIN CALCIUM 40 MG PO TABS
40.0000 mg | ORAL_TABLET | Freq: Every day | ORAL | 3 refills | Status: DC
Start: 2020-06-02 — End: 2020-09-27

## 2020-06-02 MED ORDER — RAMIPRIL 5 MG PO CAPS
ORAL_CAPSULE | ORAL | 3 refills | Status: DC
Start: 2020-06-02 — End: 2020-09-27

## 2020-06-02 MED ORDER — IVABRADINE HCL 5 MG PO TABS
5.0000 mg | ORAL_TABLET | Freq: Two times a day (BID) | ORAL | 3 refills | Status: DC
Start: 2020-06-02 — End: 2021-06-13

## 2020-06-02 MED ORDER — HYDROCHLOROTHIAZIDE 25 MG PO TABS
25.0000 mg | ORAL_TABLET | Freq: Every day | ORAL | 3 refills | Status: DC
Start: 2020-06-02 — End: 2020-09-27

## 2020-06-02 MED ORDER — SPIRONOLACTONE 25 MG PO TABS
12.5000 mg | ORAL_TABLET | Freq: Every day | ORAL | 3 refills | Status: DC
Start: 2020-06-02 — End: 2021-08-22

## 2020-06-02 MED ORDER — CARVEDILOL 25 MG PO TABS
25.0000 mg | ORAL_TABLET | Freq: Two times a day (BID) | ORAL | 3 refills | Status: DC
Start: 2020-06-02 — End: 2020-09-27

## 2020-06-02 NOTE — Patient Instructions (Signed)
Medication Instructions:  ALL CARDIAC MEDICATIONS HAVE BEEN REFILLED  *If you need a refill on your cardiac medications before your next appointment, please call your pharmacy*   Follow-Up: At [facility], you and your health needs are our priority.  As part of our continuing mission to provide you with exceptional heart care, we have created designated Provider Care Teams.  These Care Teams include your primary Cardiologist (physician) and Advanced Practice Providers (APPs -  Physician Assistants and Nurse Practitioners) who all work together to provide you with the care you need, when you need it.  We recommend signing up for the patient portal called "MyChart".  Sign up information is provided on this After Visit Summary.  MyChart is used to connect with patients for Virtual Visits (Telemedicine).  Patients are able to view lab/test results, encounter notes, upcoming appointments, etc.  Non-urgent messages can be sent to your provider as well.   To learn more about what you can do with MyChart, go to [email or web address].    Your next appointment:   12 month(s)  The format for your next appointment:   In Person  Provider:   You may see Chrystie Nose, MD or one of the following Advanced Practice Providers on your designated Care Team:    Azalee Course, PA-C  Micah Flesher, PA-C or   Judy Pimple, [place]    Other Instructions

## 2020-06-02 NOTE — Progress Notes (Signed)
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 In 2011, her EF was 45% to 50% by echocardiogram with medical therapy. Unfortunately, after her initial diagnosis, she developed a pulmonary embolus in 2003 and was on Coumadin for about 2 years, and now is on aspirin 325 mg daily. She also has diabetes, hypertension, and obesity. She recently she tripped in her house and developed a Small stress fracture and sprain of her left ankle which she has recovered from. She denies any shortness of breath with exertion, chest pain, palpitations, presyncope, syncopal symptoms.  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 She continues to feel at baseline. Interestingly, her heart rate is lower than it was at her last office visit.  Laurie Small returns today for followup. She reports doing well denies any shortness of breath or chest pain with exertion. Unfortunately she's not been able to lose any significant amount of weight. She has had some adjustments in her diabetes medicine with the addition of glipizide, she is also taking Onglyza and metformin 1000 mg twice daily.  She is interested in possibly getting off of some of her medications.  Saw Laurie Small back in the office today. Overall she is doing well without any symptoms of heart failure. Blood sugar has been better controlled. Weight appears to be down just a few pounds. EKG looks normal today. Her last echocardiogram however did not show significant improvement in EF, therefore I recommended we remain on her current heart failure medications. Currently she has NYHA class I symptoms.  04/28/2016  Laurie Small returns today for  follow-up. In the past year she's done fairly well. She denies any chest pain or worsening shortness of breath. Unfortunately she's been struggling with left plantar fasciitis and she is in a walking boot today. She's actually lost 46 pounds since we last saw her. She continues to have class I heart failure symptoms. Interestingly she is tachycardic today. Initially this was thought to be due to her rushing in the office however her heart rate remained elevated even at the end of the visit. She reports compliance with her beta blocker which is actually a high dose. I don't know if this is related to worsening heart failure with sympathetic activation. She was supposed to have an echo a year ago but we did not obtain that and I like to reassess LV function since his been 2 years since her last study. Her last EF was 40-45%. Stress new heart failure treatments that are available today including Entresto and Corlanor - which may be of some benefit.  06/21/2016  Laurie Small was seen in follow-up today. A repeat echo still shows her LVEF to be around 40%. She remains persistently tachycardiac with HR in the upper 90's and low 100's. This appears to be sinus. Blood pressure runs in the 110-120 systolic range and there is little room to increase her carvedilol which is already at 25 mg BID. She was on digoxin, but it did not seem to affect her heartrate, so I discontinued it. She may be a candidate for Corlanor. A big question is whether she has persistent, inappropriate sinus tachycardia and whether this is contributing to her cardiomyopathy.  07/28/2016  Mrs. Laurie Grant  Salomon Small returns today for follow-up. Her heart rate remains in the upper 90s. Her monitor showed heart rates between the 70s and 130s, however she is persistently in the low 90s and 100s. Blood pressure is a little higher today and she may tolerate an increase in her carvedilol. We discussed adding Corlanor, however she is not interested this time  due to cost issues.  08/29/2016  Laurie Small returns for follow-up. I increased her carvedilol and blood pressure now is more in the 110-120 systolic range. Heart rate remains elevated in the 80s and 90s. This is actually on except behind given her low ejection fraction of 40%.  I discussed this with Dr. Gala Romney and although EF is not less than 35%, there is probably good reason to consider trying it for HR improvement.  09/28/2016  Laurie Small returns today for follow-up. Interestingly her heart rate has improved in general on Ivabradine. It appears from her home measurements that her heart rate is somewhere around 70. She is on the 5 mg twice a day dosing. The only side effect she notices are the visual disturbances which seems to be worse in the morning but gets better fairly quickly.  03/30/2017  Laurie Small was seen today in follow-up. She seems to noted an improvement in her heart rate on Ivabradine. Heart rate today is 67 and in general she's not had any significant tachycardia or inappropriate increases in heart rate with minimal exertion. In addition she is on carvedilol. She denies any chest pain. She occasionally has some visual disturbances in the morning however it seems to be better as the day goes on and not associated to the dose of medicine.  04/12/2018  Laurie Small returns today for follow-up.  Overall she continues to do really well.  She is tolerated ivabradine with improvement in heart rate.  Her last echo showed her EF up to 50 to 55% as of October 2018.  She is asymptomatic with NYHA class I symptoms.  Blood pressure is well controlled today.  06/02/2020  Laurie Small is seen today in follow-up.  Overall she is doing well.  Her heart function had returned to near normal at 50 to 55% in 2018.  Since then she has had no heart failure symptoms.  Heart rate control has been consistent in the mid 70s on ivabradine and high dose carvedilol.  She is also on  ramipril and Aldactone.  I am hesitant to change her medications since she is done well with it.  PMHx:  Past Medical History:  Diagnosis Date   Depression    NICM (nonischemic cardiomyopathy) (HCC)    2D ECHO, 12/26/2011 - EF 45-50%, left ventricle borderline dilated    Past Surgical History:  Procedure Laterality Date   CARDIAC CATHETERIZATION  01/06/2002   Dilated nonsichemic cardiopmyopathy, EF 28%, at least 2+ angiographic mitral regurgitation    FAMHx:  Family History  Problem Relation Age of Onset   Hypertension Mother    GI Bleed Mother    Pneumonia Maternal Grandmother    Cancer Paternal Grandmother        Breast cancer   Breast cancer Paternal Grandmother     SOCHx:   reports that she has never smoked. She has never used smokeless tobacco. She reports that she does not drink alcohol and does not use drugs.  ALLERGIES:  Allergies  Allergen Reactions   Betadine [Povidone Iodine]    Iodine    Peanuts [Peanut Oil]    Shellfish  Allergy    Other Hives and Rash    Sunlight and Grass     ROS: Pertinent items noted in HPI and remainder of comprehensive ROS otherwise negative.  HOME MEDS: Current Outpatient Medications  Medication Sig Dispense Refill   allopurinol (ZYLOPRIM) 100 MG tablet Take 1 tablet by mouth daily.     aspirin 325 MG tablet Take 325 mg by mouth daily.     atorvastatin (LIPITOR) 40 MG tablet Take 1 tablet (40 mg total) by mouth daily at 6 PM. 90 tablet 3   carvedilol (COREG) 25 MG tablet TAKE 1 TABLET BY MOUTH 2 TIMES DAILY WITH A MEAL 180 tablet 1   diphenhydrAMINE (BENADRYL) 25 MG tablet Take 25 mg by mouth daily.     FARXIGA 10 MG TABS tablet TAKE 10 MG BY MOUTH DAILY. 90 tablet 3   glipiZIDE (GLUCOTROL XL) 5 MG 24 hr tablet Take 2 tablets in am and 1/2 a tablet before dinner 225 tablet 3   hydrochlorothiazide (HYDRODIURIL) 25 MG tablet TAKE 1 TABLET BY MOUTH EVERY DAY 90 tablet 1   hydrocortisone 2.5 % cream as needed.      Insulin Pen Needle (BD PEN NEEDLE MICRO U/F) 32G X 6 MM MISC Use with Victoza pen daily 100 each 2   ivabradine (CORLANOR) 5 MG TABS tablet Take 1 tablet (5 mg total) by mouth 2 (two) times daily with a meal. Need appointment 180 tablet 0   metFORMIN (GLUCOPHAGE) 1000 MG tablet TAKE 1 TABLET BY MOUTH 2 TIMES DAILY WITH A MEAL. 180 tablet 3   ramipril (ALTACE) 5 MG capsule TAKE 2 CAPSULES BY MOUTH IN THE MORNING AND 1 CAPSULE IN THE EVENING. 270 capsule 1   Semaglutide, 1 MG/DOSE, (OZEMPIC, 1 MG/DOSE,) 2 MG/1.5ML SOPN Inject 1 mg into the skin once a week. 6 pen 5   spironolactone (ALDACTONE) 25 MG tablet TAKE 1/2 TABLET BY MOUTH EVERY DAY 45 tablet 1   No current facility-administered medications for this visit.    LABS/IMAGING: No results found for this or any previous visit (from the past 48 hour(s)). No results found.  VITALS: BP 122/68    Pulse 75    Ht 5' 2.5" (1.588 m)    Wt 198 lb 12.8 oz (90.2 kg)    BMI 35.78 kg/m   EXAM: General appearance: alert and no distress Neck: no carotid bruit, no JVD and thyroid not enlarged, symmetric, no tenderness/mass/nodules Lungs: clear to auscultation bilaterally Heart: regular rate and rhythm, S1, S2 normal, no murmur, click, rub or gallop Abdomen: soft, non-tender; bowel sounds normal; no masses,  no organomegaly and obese Extremities: extremities normal, atraumatic, no cyanosis or edema Pulses: 2+ and symmetric Skin: Skin color, texture, turgor normal. No rashes or lesions Neurologic: Grossly normal Psych: Pleasant  EKG: Normal sinus rhythm at 75, incomplete right bundle branch block and left anterior fascicular block-personally reviewed  ASSESSMENT: 1. Nonischemic cardiomyopathy, EF 40-45% -> 50-55% (04/2017) - NYHA class I symptoms 2. Inappropriate sinus tachycardia 3. History of pulmonary embolism 4. Diabetes type 2 - controlled 5. Hypertension - controlled 6. Obesity  PLAN: 1.    Ms. Greis continues to do  well on her current therapies.  Cost of the ivabradine is an issue.  We will see if we have any samples or co-pay assistance.  She did use a card which she said took about $100 off.  Overall she continues to do well with NYHA class I symptoms.  I would recommend continue her current  therapies.  We may wish to reevaluate LV function at some point may be next year to see if she has completely normalized or is maintaining her current LVEF.  Follow-up annually or sooner as necessary.  Chrystie Nose, MD, [facility], FACP  [place]   [facility]  Medical Director of the [facility] Diplomate of the American Board of Clinical Lipidology Attending Cardiologist  Direct Dial: [phone]   Fax: [phone]  Website:  [email or web address] Nicely Laquincy Eastridge 06/02/2020, 2:00 PM

## 2020-06-22 ENCOUNTER — Ambulatory Visit: Payer: No Typology Code available for payment source | Admitting: Internal Medicine

## 2020-07-08 ENCOUNTER — Other Ambulatory Visit: Payer: Self-pay | Admitting: Internal Medicine

## 2020-07-18 ENCOUNTER — Other Ambulatory Visit: Payer: Self-pay | Admitting: Internal Medicine

## 2020-08-03 ENCOUNTER — Ambulatory Visit: Payer: No Typology Code available for payment source | Admitting: Internal Medicine

## 2020-08-18 LAB — HM DIABETES EYE EXAM

## 2020-08-23 ENCOUNTER — Other Ambulatory Visit: Payer: Self-pay | Admitting: Internal Medicine

## 2020-09-06 ENCOUNTER — Encounter: Payer: Self-pay | Admitting: Internal Medicine

## 2020-09-06 ENCOUNTER — Ambulatory Visit (INDEPENDENT_AMBULATORY_CARE_PROVIDER_SITE_OTHER): Payer: No Typology Code available for payment source | Admitting: Internal Medicine

## 2020-09-06 ENCOUNTER — Other Ambulatory Visit: Payer: Self-pay

## 2020-09-06 VITALS — BP 128/80 | HR 83 | Ht 62.5 in | Wt 203.4 lb

## 2020-09-06 DIAGNOSIS — E785 Hyperlipidemia, unspecified: Secondary | ICD-10-CM | POA: Diagnosis not present

## 2020-09-06 DIAGNOSIS — R809 Proteinuria, unspecified: Secondary | ICD-10-CM

## 2020-09-06 DIAGNOSIS — E1129 Type 2 diabetes mellitus with other diabetic kidney complication: Secondary | ICD-10-CM | POA: Diagnosis not present

## 2020-09-06 DIAGNOSIS — E669 Obesity, unspecified: Secondary | ICD-10-CM

## 2020-09-06 LAB — POCT GLYCOSYLATED HEMOGLOBIN (HGB A1C): Hemoglobin A1C: 8.1 % — AB (ref 4.0–5.6)

## 2020-09-06 MED ORDER — ONETOUCH VERIO VI STRP: ORAL_STRIP | 3 refills | Status: DC

## 2020-09-06 NOTE — Progress Notes (Addendum)
Patient ID: Laurie Small, female   DOB: Jan 23, 1976, 45 y.o.   MRN: [id number]  This visit occurred during the SARS-CoV-2 public health emergency.  Safety protocols were in place, including screening questions prior to the visit, additional usage of staff PPE, and extensive cleaning of exam room while observing appropriate contact time as indicated for disinfecting solutions.   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 Last visit 5 mo ago.  She saw Cristy Folks  for nutrition advice before last visit.  She started to adopt the dietary changes and her sugars improved.  Reviewed HbA1c levels: Lab Results  Component Value Date   HGBA1C 7.4 (A) 03/26/2020   HGBA1C 7.6 (A) 11/21/2019   HGBA1C 7.6 (A) 05/23/2019  05/23/2019: HbA1c calculated from fructosamine is 5.77%, in this case, however, discrepant with the sugars at home. 05/20/2018: HbA1c calculated from the fructosamine is 6.27%, still excellent. 01/07/2018: HbA1c calculated from fructosamine is 6.1% (great, slightly higher than before!) 03/22/2017: HbA1c calculated from fructosamine is 5.9%!  12/14/2016: HbA1c calculated from fructosamine is 6.6%. 09/14/2016: HbA1c calculated from fructosamine is MUCH lower, at 6.8%! 05/31/2015: HbA1c 10% 09/15/2014: HbA1c 9.6% 04/23/2014: HbA1c 10.9% 01/20/2014: HbA1c 12.9%  Pt is on a regimen of: - Metformin 1000 mg 2x a day, with meals - Glipizide ER XL 10 mg before breakfast and 2.5 mg before dinner - Farxiga 10 mg in am - Ozempic 0.5 >> 1 mg weekly  She was on Onglyza (too expensive).  She was on JanuMet (stopped being effective), then Januvia >> stopped when starting Trulicity She was on Victoza (trial period). We changed from Victoza to Ozempic 08/2018. She refuses insulin.  Pt checks her sugars 1-2 times a day: - am: 90,  103-204, 208 >> 58, 66-148, 162, 212 (forgot meds) >> 68, 76, 82-180, 190 - 2h after b'fast: 244 >> n/c >> 150, 184, 241 >> n/c - before lunch: 128, 161 >> 59, 127-162 >> 100, 148 - 2h after lunch: 245-344 >> 87, 243 >> 130, 240 - before dinner:  101, 180-210 >> 139 >> n/c - 2h after dinner:216-225 >> 184 >> 190 - bedtime: n/c >> 115-193, 206 >> n/c - nighttime: 85-142, 195 >> 73, 75, 214 >> n/c Lowest sugar was 90 >> 58 ; it is unclear at which level she has hypoglycemia awareness Highest sugar was 344 >> 243.  Glucometer: FreeStyle Ultra mini >> ReliOn  Pt's meals are: - Breakfast: fruit, yoghurt, oatmeal, eggs, grits,  - Lunch: pasta, sandwich, salad - Dinner: meat + veggies - Snacks: 3  During the coronavirus pandemic, she had to change her job and this was a stressful. She works from home and dinner is usually late.  -+ h/o CKD, last BUN/creatinine:  Lab Results  Component Value Date   BUN 22 03/26/2020   CREATININE 1.19 (H) 03/26/2020  03/27/2019: 17/1.01, GFR 72, glucose 128 Lab Results  Component Value Date   GFRAA 63 03/26/2020   GFRAA 82 01/07/2018    No recent microalbuminuria: Lab Results  Component Value Date   MICRALBCREAT 2.5 03/26/2020   MICRALBCREAT 1.0 01/07/2018  03/27/2019: ACR 21.2 ACR  (09/15/2014): 44.4 ACR (01/20/2014): 79.9 On ramipril.  -+ HL; last set of lipids: Lab Results  Component Value Date   CHOL 133 03/26/2020   HDL 54.30 03/26/2020   LDLCALC 56 03/26/2020   TRIG 114.0 03/26/2020   CHOLHDL 2 03/26/2020  03/27/2019: 147/138/57/62 03/31/2016: 136/69/49/73 06/12/2014: 147/118/44/80 On Lipitor 40.  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  She has heel spurs.  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  She stopped Digoxin >> on Corlanor now. 2D echo by her cardiologist >> EF improved to 50 to  55%.  ROS: Constitutional: no weight gain/no weight loss, no fatigue, no subjective hyperthermia, no subjective hypothermia Eyes: no blurry vision, no xerophthalmia ENT: no sore throat, no nodules palpated in neck, no dysphagia, no odynophagia, no hoarseness Cardiovascular: no CP/no SOB/no palpitations/no leg swelling Respiratory: no cough/no SOB/no wheezing Gastrointestinal: no N/no V/no D/no C/no acid reflux Musculoskeletal: no muscle aches/no joint aches Skin: no rashes, no hair loss Neurological: no tremors/no numbness/no tingling/no dizziness  I reviewed pt's medications, allergies, PMH, social hx, family hx, and changes were documented in the history of present illness. Otherwise, unchanged from my initial visit note.  Past Medical History:  Diagnosis Date  . Depression   . NICM (nonischemic cardiomyopathy) (HCC)    2D ECHO, 12/26/2011 - EF 45-50%, left ventricle borderline dilated   Past Surgical History:  Procedure Laterality Date  . CARDIAC CATHETERIZATION  01/06/2002   Dilated nonsichemic cardiopmyopathy, EF 28%, at least 2+ angiographic mitral regurgitation   Social History   Social History  . Marital Status: Married    Spouse Name: N/A  . Number of Children: 1   Occupational History  .  claims benefits specialist    Social History Main Topics  . Smoking status: Never Smoker   . Smokeless tobacco: Not on file  . Alcohol Use: No  . Drug Use: No   Current Outpatient Medications on File Prior to Visit  Medication Sig Dispense Refill  . allopurinol (ZYLOPRIM) 100 MG tablet Take 1 tablet by mouth daily.    Marland Kitchen aspirin 325 MG tablet Take 325 mg by mouth daily.    Marland Kitchen atorvastatin (LIPITOR) 40 MG tablet Take 1 tablet (40 mg total) by mouth daily at 6 PM. 90 tablet 3  . carvedilol (COREG) 25 MG tablet Take 1 tablet (25 mg total) by mouth 2 (two) times daily with a meal. 180 tablet 3  . diphenhydrAMINE (BENADRYL) 25 MG tablet Take 25 mg by mouth daily.    Marland Kitchen FARXIGA 10 MG  TABS tablet TAKE 1 TABLET BY MOUTH EVERY DAY 90 tablet 3  . glipiZIDE (GLUCOTROL XL) 5 MG 24 hr tablet Take 2 tablets in am and 1/2 a tablet before dinner 225 tablet 3  . hydrochlorothiazide (HYDRODIURIL) 25 MG tablet Take 1 tablet (25 mg total) by mouth daily. 90 tablet 3  . hydrocortisone 2.5 % cream as needed.    . Insulin Pen Needle (BD PEN NEEDLE MICRO U/F) 32G X 6 MM MISC Use with Victoza pen daily 100 each 2  . ivabradine (CORLANOR) 5 MG TABS tablet Take 1 tablet (5 mg total) by mouth 2 (two) times daily with a meal. Need appointment 180 tablet 3  . metFORMIN (GLUCOPHAGE) 1000 MG tablet TAKE 1 TABLET BY MOUTH 2 TIMES DAILY WITH A MEAL. 180 tablet 3  . OZEMPIC, 1 MG/DOSE, 4 MG/3ML SOPN INJECT 1MG  INTO THE SKIN ONCE A WEEK 3 mL 3  . ramipril (ALTACE) 5 MG capsule Take 2 capsules by mouth in the morning and 1 capsule in the evening. 270 capsule 3  . Semaglutide,  1 MG/DOSE, (OZEMPIC, 1 MG/DOSE,) 2 MG/1.5ML SOPN Inject 1 mg into the skin once a week. 6 pen 5  . spironolactone (ALDACTONE) 25 MG tablet Take 0.5 tablets (12.5 mg total) by mouth daily. 45 tablet 3   No current facility-administered medications on file prior to visit.   Allergies  Allergen Reactions  . Betadine [Povidone Iodine]   . Iodine   . Peanuts [Peanut Oil]   . Shellfish Allergy   . Other Hives and Rash    Sunlight and Grass    Family History  Problem Relation Age of Onset  . Hypertension Mother   . GI Bleed Mother   . Pneumonia Maternal Grandmother   . Cancer Paternal Grandmother        Breast cancer  . Breast cancer Paternal Grandmother    PE: BP 128/80   Pulse 83   Ht 5' 2.5" (1.588 m)   Wt 203 lb 6.4 oz (92.3 kg)   SpO2 98%   BMI 36.61 kg/m  Body mass index is 36.61 kg/m. Wt Readings from Last 3 Encounters:  09/06/20 203 lb 6.4 oz (92.3 kg)  06/02/20 198 lb 12.8 oz (90.2 kg)  03/26/20 198 lb (89.8 kg)   Constitutional: overweight, in NAD Eyes: PERRLA, EOMI, no exophthalmos ENT: moist mucous  membranes, no thyromegaly, no cervical lymphadenopathy Cardiovascular: RRR, No MRG Respiratory: CTA B Gastrointestinal: abdomen soft, NT, ND, BS+ Musculoskeletal: no deformities, strength intact in all 4 Skin: moist, warm, no rashes Neurological: no tremor with outstretched hands, DTR normal in all 4  ASSESSMENT: 1. DM2, non-insulin-dependent, uncontrolled, with complications - CKD - DR  Cardiologist: Dr Rennis Golden  2. Obesity class 2 BMI Classification:  < 18.5 underweight   18.5-24.9 normal weight   25.0-29.9 overweight   30.0-34.9 class I obesity   35.0-39.9 class II obesity   ? 40.0 class III obesity   3. HL  PLAN:  1. Patient with longstanding, uncontrolled, DM2, on oral antidiabetic regimen with Metformin, sulfonylurea, SGLT2 inhibitor and also weekly GLP-1 receptor agonist, with still suboptimal control.  At last visit, sugars started to improve after she started to improve her diet.  Her sugars were mostly at goal in the morning with food exceptions and I advised her to write down possible reasons for the high blood sugars.  I also advised her to start checking sugars later in the day, as she was not checking then.  Otherwise, we did not change her regimen.  HbA1c was better, at 7.4%. -Her kidney function was slightly lower at last visit, but consistent with prior values.  I advised her to stay hydrated.  She has an appointment coming up with PCP and this will be checked. -At today's visit, sugars are usually at goal in the morning but occasionally she has higher blood sugars in the 170s to 190.  Upon review of her sugar log, it appears that these usually happen around the time of her next Ozempic injection.  Upon questioning, she is taking this on Mondays but she may have more dietary indiscretions during the weekend.  We discussed about moving Ozempic on Saturday morning.  Also, I advised her to check some sugars at bedtime and to rotate the blood sugar checks more.  She tells  me that she had problems obtaining enough strips from the pharmacy.  I called in a prescription today to check twice a day.  I am hoping this will be covered by her insurance. - I suggested to:  Patient Instructions  Please  continue: - Metformin 1000 mg 2x a day, with meals - Glipizide ER XL 10 mg before breakfast and 2.5 mg before dinner - Farxiga 10 mg in am  - Ozempic 1 mg weekly  Please ask Dr. Tracie Harrier if she can check a fructosamine level and send it to me.  Check some sugars at bedtime.  Move Ozempic to Saturday morning.  Please return in 3-4 months with your sugar log.  - we checked her HbA1c: 8.1% (higher).  However, sugars at home appear to be slightly better than expected from his HbA1c.  I would like to check a fructosamine level but we do not have a lab technician today.  Since she has labs with PCP soon, I advised her to discuss with PCP whether a fructosamine level could be checked.  If not, will check one at next visit. - advised to check sugars at different times of the day - 2x a day, rotating check times - advised for yearly eye exams >> she is UTD - return to clinic in 3-4 months        2. Obesity class 2 -She continues to walk for exercise. -Before last visit, she lost 3 pounds, switching to a more plant-based diet.  Since last visit, she gained 5 pounds. -continue SGLT 2 inhibitor and GLP-1 receptor agonist which should also help with weight loss  3. HL - Reviewed latest lipid panel from 03/2020: All fractions at goal: Lab Results  Component Value Date   CHOL 133 03/26/2020   HDL 54.30 03/26/2020   LDLCALC 56 03/26/2020   TRIG 114.0 03/26/2020   CHOLHDL 2 03/26/2020  -Continues Lipitor 40 without side effects  Addendum (09/17/2020): Received labs from PCP from 09/13/2020: Fructosamine 255 -HbA1c calculated from fructosamine is 5.9%, much better than the directly measured HbA1c, although slightly lower than expected from her log Lipids: 138/93/56/64 Glucose  131, BUN/creatinine 29/1.13, GFR 62  Carlus Pavlov, MD PhD [facility] Endocrinology

## 2020-09-06 NOTE — Patient Instructions (Addendum)
Please continue: - Metformin 1000 mg 2x a day, with meals - Glipizide ER XL 10 mg before breakfast and 2.5 mg before dinner - Farxiga 10 mg in am  - Ozempic 1 mg weekly  Please ask Dr. Tracie Harrier if she can check a fructosamine level and send it to me.  Check some sugars at bedtime.  Move Ozempic to Saturday morning.  Please return in 3-4 months with your sugar log.

## 2020-09-06 NOTE — Addendum Note (Signed)
Addended by: Kenyon Ana on: 09/06/2020 03:38 PM   Modules accepted: Orders

## 2020-09-14 ENCOUNTER — Other Ambulatory Visit: Payer: Self-pay | Admitting: Family Medicine

## 2020-09-14 ENCOUNTER — Other Ambulatory Visit: Payer: Self-pay | Admitting: Internal Medicine

## 2020-09-14 DIAGNOSIS — Z1231 Encounter for screening mammogram for malignant neoplasm of breast: Secondary | ICD-10-CM

## 2020-09-15 NOTE — Telephone Encounter (Signed)
Currently out of Ozempic. Pt can not have Rx sent to any other pharmacy. Alternative?

## 2020-09-17 ENCOUNTER — Encounter: Payer: Self-pay | Admitting: Internal Medicine

## 2020-09-17 ENCOUNTER — Other Ambulatory Visit: Payer: Self-pay | Admitting: Internal Medicine

## 2020-09-21 NOTE — Telephone Encounter (Signed)
Please advise 

## 2020-09-22 NOTE — Telephone Encounter (Signed)
Can we use another pharmacy?

## 2020-09-24 NOTE — Telephone Encounter (Signed)
Message left for patient to return my call.  

## 2020-09-27 ENCOUNTER — Other Ambulatory Visit: Payer: Self-pay

## 2020-09-27 MED ORDER — HYDROCHLOROTHIAZIDE 25 MG PO TABS: 25.0000 mg | ORAL_TABLET | Freq: Every day | ORAL | 3 refills | Status: DC

## 2020-09-27 MED ORDER — RAMIPRIL 5 MG PO CAPS: ORAL_CAPSULE | ORAL | 3 refills | Status: DC

## 2020-09-27 MED ORDER — CARVEDILOL 25 MG PO TABS: 25.0000 mg | ORAL_TABLET | Freq: Two times a day (BID) | ORAL | 3 refills | Status: DC

## 2020-09-27 MED ORDER — ATORVASTATIN CALCIUM 40 MG PO TABS: 40.0000 mg | ORAL_TABLET | Freq: Every day | ORAL | 3 refills | Status: DC

## 2020-09-28 ENCOUNTER — Telehealth: Payer: Self-pay

## 2020-09-28 DIAGNOSIS — E1129 Type 2 diabetes mellitus with other diabetic kidney complication: Secondary | ICD-10-CM

## 2020-09-28 MED ORDER — METFORMIN HCL 1000 MG PO TABS: 1000.0000 mg | ORAL_TABLET | Freq: Two times a day (BID) | ORAL | 3 refills | Status: DC

## 2020-09-28 MED ORDER — DAPAGLIFLOZIN PROPANEDIOL 10 MG PO TABS: 10.0000 mg | ORAL_TABLET | Freq: Every day | ORAL | 3 refills | Status: DC

## 2020-09-28 MED ORDER — GLIPIZIDE ER 5 MG PO TB24: ORAL_TABLET | ORAL | 3 refills | Status: DC

## 2020-09-28 NOTE — Telephone Encounter (Signed)
Message left for patient to return my call. Also sent a message on MyChart 

## 2020-09-28 NOTE — Telephone Encounter (Signed)
Inbound fax from [facility] requesting rx be sent to [facility] for the follow medications: Glipizide, Farxiga, and Metformin Called and confirmed with pt [facility] is her preferred pharmacy.

## 2020-09-29 NOTE — Telephone Encounter (Signed)
Please advise 

## 2020-10-08 ENCOUNTER — Telehealth: Payer: Self-pay

## 2020-10-08 NOTE — Telephone Encounter (Signed)
Called and advised pt there is a sample box of Ozempic labeled for her to pick up.

## 2020-10-28 ENCOUNTER — Encounter: Payer: Self-pay | Admitting: Internal Medicine

## 2020-11-01 ENCOUNTER — Other Ambulatory Visit: Payer: Self-pay | Admitting: Internal Medicine

## 2020-11-01 MED ORDER — TRULICITY 3 MG/0.5ML ~~LOC~~ SOAJ: 3.0000 mg | SUBCUTANEOUS | 11 refills | Status: DC

## 2020-11-02 ENCOUNTER — Ambulatory Visit
Admission: RE | Admit: 2020-11-02 | Discharge: 2020-11-02 | Disposition: A | Discharge status: Home / Self Care | Payer: No Typology Code available for payment source | Source: Ambulatory Visit | Attending: Family Medicine | Admitting: Family Medicine

## 2020-11-02 ENCOUNTER — Other Ambulatory Visit: Payer: Self-pay

## 2020-11-02 DIAGNOSIS — Z1231 Encounter for screening mammogram for malignant neoplasm of breast: Secondary | ICD-10-CM

## 2020-11-29 ENCOUNTER — Other Ambulatory Visit: Payer: Self-pay | Admitting: Internal Medicine

## 2021-01-04 ENCOUNTER — Encounter: Payer: Self-pay | Admitting: Internal Medicine

## 2021-01-04 ENCOUNTER — Other Ambulatory Visit: Payer: Self-pay

## 2021-01-04 ENCOUNTER — Ambulatory Visit (INDEPENDENT_AMBULATORY_CARE_PROVIDER_SITE_OTHER): Payer: No Typology Code available for payment source | Admitting: Internal Medicine

## 2021-01-04 VITALS — BP 140/82 | HR 76 | Ht 62.5 in | Wt 205.0 lb

## 2021-01-04 DIAGNOSIS — E669 Obesity, unspecified: Secondary | ICD-10-CM

## 2021-01-04 DIAGNOSIS — E785 Hyperlipidemia, unspecified: Secondary | ICD-10-CM

## 2021-01-04 DIAGNOSIS — R809 Proteinuria, unspecified: Secondary | ICD-10-CM

## 2021-01-04 DIAGNOSIS — E1129 Type 2 diabetes mellitus with other diabetic kidney complication: Secondary | ICD-10-CM | POA: Diagnosis not present

## 2021-01-04 LAB — POCT GLYCOSYLATED HEMOGLOBIN (HGB A1C): Hemoglobin A1C: 9.1 % — AB (ref 4.0–5.6)

## 2021-01-04 MED ORDER — OZEMPIC (2 MG/DOSE) 8 MG/3ML ~~LOC~~ SOPN: 2.0000 mg | PEN_INJECTOR | SUBCUTANEOUS | 3 refills | Status: DC

## 2021-01-04 NOTE — Progress Notes (Signed)
Patient ID: Laurie Small, female   DOB: 17-Oct-1975, 45 y.o.   MRN: [id number]  This visit occurred during the SARS-CoV-2 public health emergency.  Safety protocols were in place, including screening questions prior to the visit, additional usage of staff PPE, and extensive cleaning of exam room while observing appropriate contact time as indicated for disinfecting solutions.   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 Last visit 5 mo ago.  Interim history: She saw Cristy Folks  for nutrition advice before last visit.  She started to adopt the dietary changes and her sugars improved. Since last visit, her sugars increased because her pharmacy did not have Ozempic in stock so she was for a few weeks with noted and afterwards on Trulicity, but with delays in getting even this from the pharmacy. No increased urination, blurry vision, nausea, chest pain.  She does have increased thirst.  Reviewed HbA1c levels: 09/13/2020: HbA1c calculated from fructosamine is 5.9%, much better than the directly measured HbA1c, although slightly lower than expected from her log Lab Results  Component Value Date   HGBA1C 8.1 (A) 09/06/2020   HGBA1C 7.4 (A) 03/26/2020   HGBA1C 7.6 (A) 11/21/2019  05/23/2019: HbA1c calculated from fructosamine is 5.77%, in this case, however, discrepant with the sugars at home. 05/20/2018: HbA1c calculated from the fructosamine is 6.27%, still excellent. 01/07/2018: HbA1c calculated from fructosamine is 6.1% (great, slightly higher than before!) 03/22/2017: HbA1c calculated from fructosamine is 5.9%!  12/14/2016: HbA1c calculated from fructosamine is 6.6%. 09/14/2016: HbA1c calculated from fructosamine is MUCH lower, at 6.8%! 05/31/2015: HbA1c 10% 09/15/2014: HbA1c 9.6% 04/23/2014: HbA1c 10.9% 01/20/2014: HbA1c  12.9%  Pt is on a regimen of: - Metformin 1000 mg 2x a day, with meals - Glipizide ER XL 10 mg before breakfast and 2.5 mg before dinner - Farxiga 10 mg in am - Ozempic 0.5 >> 1 mg weekly >> Trulicity 3 mg weekly She was on Onglyza (too expensive).  She was on JanuMet (stopped being effective), then Januvia >> stopped when starting Trulicity She was on Victoza (trial period). We changed from Victoza to Ozempic 08/2018. She refuses insulin.  Pt checks her sugars 1-2 times a day: - am: 58, 66-148, 162, 212 >> 68, 76, 82-180, 190 >> 88, 92-160, 190, 214 - 2h after b'fast: 244 >> n/c >> 150, 184, 241 >> n/c - before lunch: 128, 161 >> 59, 127-162 >> 100, 148 >> 224 - 2h after lunch: 245-344 >> 87, 243 >> 130, 240 >> n/c - before dinner:  101, 180-210 >> 139 >> n/c >> 130-170 - 2h after dinner:216-225 >> 184 >> 190 >> n/c - bedtime: n/c >> 115-193, 206 >> n/c - nighttime: 85-142, 195 >> 73, 75, 214 >> n/c Lowest sugar was 90 >> 58 >> 88; it is unclear at which level she has hypoglycemia awareness Highest sugar was 344 >> 243 >> 224.  Glucometer: FreeStyle Ultra mini >> ReliOn  Pt's meals are: - Breakfast: fruit, yoghurt, oatmeal, eggs, grits, - Lunch: pasta, sandwich, salad - Dinner: meat + veggies - Snacks: 3  During the coronavirus pandemic, she had to change her job and this was a stressful. She works from home and dinner is usually late.  -+ h/o CKD, last BUN/creatinine:  09/13/2020: Glucose 131, BUN/creatinine 29/1.13, GFR 62 Lab Results  Component Value Date   BUN 22 03/26/2020   CREATININE 1.19 (H) 03/26/2020  03/27/2019: 17/1.01, GFR 72, glucose 128 Lab Results  Component Value Date   GFRAA 63 03/26/2020   GFRAA 82 01/07/2018    No recent microalbuminuria: Lab Results  Component Value Date   MICRALBCREAT 2.5 03/26/2020   MICRALBCREAT 1.0 01/07/2018  03/27/2019: ACR 21.2 ACR  (09/15/2014): 44.4 ACR (01/20/2014): 79.9 On ramipril.  -+ HL; last set of  lipids: 09/17/2020: 138/93/56/64 Lab Results  Component Value Date   CHOL 133 03/26/2020   HDL 54.30 03/26/2020   LDLCALC 56 03/26/2020   TRIG 114.0 03/26/2020   CHOLHDL 2 03/26/2020  03/27/2019: 147/138/57/62 03/31/2016: 136/69/49/73 06/12/2014: 147/118/44/80 On Lipitor 40.  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  She has heel spurs.  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  She stopped Digoxin >> on Corlanor now. 2D echo by her cardiologist >> EF improved to 50 to 55%.  ROS: Constitutional: no weight gain/no weight loss, no fatigue, no subjective hyperthermia, no subjective hypothermia Eyes: no blurry vision, no xerophthalmia ENT: no sore throat, no nodules palpated in neck, no dysphagia, no odynophagia, no hoarseness Cardiovascular: no CP/no SOB/no palpitations/no leg swelling Respiratory: no cough/no SOB/no wheezing Gastrointestinal: no N/no V/no D/no C/no acid reflux Musculoskeletal: no muscle aches/no joint aches Skin: no rashes, no hair loss Neurological: no tremors/no numbness/no tingling/no dizziness  I reviewed pt's medications, allergies, PMH, social hx, family hx, and changes were documented in the history of present illness. Otherwise, unchanged from my initial visit note.  Past Medical History:  Diagnosis Date   Depression    NICM (nonischemic cardiomyopathy) (HCC)    2D ECHO, 12/26/2011 - EF 45-50%, left ventricle borderline dilated   Past Surgical History:  Procedure Laterality Date   CARDIAC CATHETERIZATION  01/06/2002   Dilated nonsichemic cardiopmyopathy, EF 28%, at least 2+ angiographic mitral regurgitation   Social History   Social History   Marital Status: Married    Spouse Name: N/A   Number of Children: 1   Occupational History    claims benefits specialist    Social History Main Topics   Smoking status: Never Smoker     Smokeless tobacco: Not on file   Alcohol Use: No   Drug Use: No   Current Outpatient Medications on File Prior to Visit  Medication Sig Dispense Refill   allopurinol (ZYLOPRIM) 100 MG tablet Take 1 tablet by mouth daily.     aspirin 325 MG tablet Take 325 mg by mouth daily.     atorvastatin (LIPITOR) 40 MG tablet Take 1 tablet (40 mg total) by mouth daily at 6 PM. 90 tablet 3   carvedilol (COREG) 25 MG tablet Take 1 tablet (25 mg total) by mouth 2 (two) times daily with a meal. 180 tablet 3   dapagliflozin propanediol (FARXIGA) 10 MG TABS tablet Take 1 tablet (10 mg total) by mouth daily. 90 tablet 3   diphenhydrAMINE (BENADRYL) 25 MG tablet Take 25 mg by mouth daily.     Dulaglutide (TRULICITY) 3 MG/0.5ML SOPN Inject 3 mg into the skin once a week. 2 mL 11   glipiZIDE (GLUCOTROL XL) 5 MG 24 hr tablet Take 2 tablets in am and 1/2 a tablet before dinner 225 tablet 3   glucose blood (ONETOUCH VERIO) test strip USE TO CHECK BLOOD SUGAR   TWO TIMES A DAY 100 strip 3   hydrochlorothiazide (HYDRODIURIL)  25 MG tablet Take 1 tablet (25 mg total) by mouth daily. 90 tablet 3   hydrocortisone 2.5 % cream as needed.     Insulin Pen Needle (BD PEN NEEDLE MICRO U/F) 32G X 6 MM MISC Use with Victoza pen daily 100 each 2   ivabradine (CORLANOR) 5 MG TABS tablet Take 1 tablet (5 mg total) by mouth 2 (two) times daily with a meal. Need appointment 180 tablet 3   metFORMIN (GLUCOPHAGE) 1000 MG tablet Take 1 tablet (1,000 mg total) by mouth 2 (two) times daily with a meal. 180 tablet 3   OZEMPIC, 1 MG/DOSE, 4 MG/3ML SOPN INJECT 1MG  INTO THE SKIN ONCE A WEEK 9 mL 3   ramipril (ALTACE) 5 MG capsule Take 2 capsules by mouth in the morning and 1 capsule in the evening. 270 capsule 3   spironolactone (ALDACTONE) 25 MG tablet Take 0.5 tablets (12.5 mg total) by mouth daily. 45 tablet 3   No current facility-administered medications on file prior to visit.   Allergies  Allergen Reactions   Betadine [Povidone  Iodine]    Iodine    Peanuts [Peanut Oil]    Shellfish Allergy    Other Hives and Rash    Sunlight and Grass    Family History  Problem Relation Age of Onset   Hypertension Mother    GI Bleed Mother    Pneumonia Maternal Grandmother    Cancer Paternal Grandmother        Breast cancer   Breast cancer Paternal Grandmother    PE: BP 140/82 (BP Location: Right Arm, Patient Position: Sitting, Cuff Size: Normal)   Pulse 76   Ht 5' 2.5" (1.588 m)   Wt 205 lb (93 kg)   SpO2 98%   BMI 36.90 kg/m  Body mass index is 36.9 kg/m. Wt Readings from Last 3 Encounters:  01/04/21 205 lb (93 kg)  09/06/20 203 lb 6.4 oz (92.3 kg)  06/02/20 198 lb 12.8 oz (90.2 kg)   Constitutional: overweight, in NAD Eyes: PERRLA, EOMI, no exophthalmos ENT: moist mucous membranes, no thyromegaly, no cervical lymphadenopathy Cardiovascular: RRR, No MRG Respiratory: CTA B Gastrointestinal: abdomen soft, NT, ND, BS+ Musculoskeletal: no deformities, strength intact in all 4 Skin: moist, warm, no rashes Neurological: no tremor with outstretched hands, DTR normal in all 4  ASSESSMENT: 1. DM2, non-insulin-dependent, uncontrolled, with complications - CKD - DR  Cardiologist: Dr 13/10/21  2. Obesity class 2 BMI Classification: < 18.5 underweight  18.5-24.9 normal weight  25.0-29.9 overweight  30.0-34.9 class I obesity  35.0-39.9 class II obesity  ? 40.0 class III obesity   3. HL  PLAN:  1. Patient with longstanding, uncontrolled, type 2 diabetes, on oral antidiabetic regimen with metformin, sulfonylurea, and SGLT2 inhibitor and also weekly GLP-1 receptor agonist, with a high HbA1c at last visit, at 8.1, however, higher than expected from her log.  We checked a fructosamine level after the visit and the HbA1c calculated from the fructosamine was actually much better, at 5.9%.  At last visit, sugars were usually at goal in the morning with only occasional higher values around the time of her next Ozempic  injection.  She was taking it on Mondays but she had more dietary indiscretions during the weekend so I advised her about moving Ozempic on Saturday morning.  I also advised him to check some sugars at bedtime.  Since then, she contacted me that her pharmacy did not carry Ozempic.  We sent Trulicity to the pharmacy in 10/2020.  She started this, but she was getting it from the pharmacy without delay. -At today's visit, sugars are higher, up to the 200s, without her changing her diet or oth anything else, mostly related to not getting her GLP-1 receptor agonist at all or not getting it on time.  On her blood sugar log, sugars are much better right after the GLP-1 receptor agonist injection and they increased around the time of the next injection. -At this visit, we discussed about trying to obtain Ozempic from another [facility] and I printed the prescription for her.  I also sent a new prescription to her original pharmacy in case they have  this in stock now.  We discussed about increasing the dose to 2 mg weekly but I did advise her that sugars may improve significantly on this as she may need to stop glipizide. - I suggested to:  Patient Instructions  Please continue: - Metformin 1000 mg 2x a day, with meals - Glipizide ER XL 10 mg before breakfast and 2.5 mg before dinner - Farxiga 10 mg in am   Increase: - Ozempic 2 mg weekly  If the sugars decrease on Ozempic >> you may stop Glipizide.  Please return in 3-4 months with your sugar log.  - we checked her HbA1c: 9.1% (higher) - advised to check sugars at different times of the day - 1x a day, rotating check times - advised for yearly eye exams >> she is UTD - return to clinic in 3-4 months        2. Obesity class 2 -She continues to walk for exercise -She gained 5 pounds before last visit and 2 pounds since then -continue SGLT 2 inhibitor and GLP-1 receptor agonist which should also help with weight loss.  We will increase the dose of her  Ozempic.  3. HL -Reviewed latest lipid panel from 08/2020: All fractions at goal -Continues Lipitor 40 without side effects  Carlus Pavlov, MD PhD [facility] Endocrinology

## 2021-01-04 NOTE — Addendum Note (Signed)
Addended by: Kenyon Ana on: 01/04/2021 03:06 PM   Modules accepted: Orders

## 2021-01-04 NOTE — Patient Instructions (Addendum)
Please continue: - Metformin 1000 mg 2x a day, with meals - Glipizide ER XL 10 mg before breakfast and 2.5 mg before dinner - Farxiga 10 mg in am   Increase: - Ozempic 2 mg weekly  If the sugars decrease on Ozempic >> you may stop Glipizide.  Please return in 3-4 months with your sugar log.

## 2021-02-28 ENCOUNTER — Telehealth: Payer: No Typology Code available for payment source | Admitting: Internal Medicine

## 2021-03-22 ENCOUNTER — Other Ambulatory Visit: Payer: Self-pay | Admitting: Internal Medicine

## 2021-03-24 ENCOUNTER — Telehealth: Payer: Self-pay

## 2021-03-24 NOTE — Telephone Encounter (Signed)
Called and advised pt sample is available at office until Ozempic is back in stock at the pharmacy.

## 2021-03-25 ENCOUNTER — Other Ambulatory Visit: Payer: Self-pay | Admitting: Internal Medicine

## 2021-04-01 ENCOUNTER — Other Ambulatory Visit: Payer: Self-pay | Admitting: Internal Medicine

## 2021-04-06 ENCOUNTER — Other Ambulatory Visit: Payer: Self-pay | Admitting: Internal Medicine

## 2021-04-06 DIAGNOSIS — E1129 Type 2 diabetes mellitus with other diabetic kidney complication: Secondary | ICD-10-CM

## 2021-04-12 ENCOUNTER — Other Ambulatory Visit: Payer: Self-pay

## 2021-04-12 ENCOUNTER — Ambulatory Visit (INDEPENDENT_AMBULATORY_CARE_PROVIDER_SITE_OTHER): Payer: No Typology Code available for payment source | Admitting: Podiatry

## 2021-04-12 ENCOUNTER — Encounter: Payer: Self-pay | Admitting: Podiatry

## 2021-04-12 ENCOUNTER — Ambulatory Visit (INDEPENDENT_AMBULATORY_CARE_PROVIDER_SITE_OTHER): Payer: No Typology Code available for payment source

## 2021-04-12 DIAGNOSIS — M722 Plantar fascial fibromatosis: Secondary | ICD-10-CM

## 2021-04-12 DIAGNOSIS — Q666 Other congenital valgus deformities of feet: Secondary | ICD-10-CM

## 2021-04-12 DIAGNOSIS — E119 Type 2 diabetes mellitus without complications: Secondary | ICD-10-CM | POA: Diagnosis not present

## 2021-04-12 MED ORDER — TRIAMCINOLONE ACETONIDE 40 MG/ML IJ SUSP
40.0000 mg | Freq: Once | INTRAMUSCULAR | Status: AC
  Administered 2021-04-12: 40 mg

## 2021-04-12 NOTE — Patient Instructions (Signed)
Plantar Fasciitis Rehab Ask your health care provider which exercises are safe for you. Do exercises exactly as told by your health care provider and adjust them as directed. It is normal to feel mild stretching, pulling, tightness, or discomfort as you do these exercises. Stop right away if you feel sudden pain or your pain gets worse. Do not begin these exercises until told by your health care provider. Stretching and range-of-motion exercises These exercises warm up your muscles and joints and improve the movement andflexibility of your foot. These exercises also help to relieve pain. Plantar fascia stretch  Sit with your left / right leg crossed over your opposite knee. Hold your heel with one hand with that thumb near your arch. With your other hand, hold your toes and gently pull them back toward the top of your foot. You should feel a stretch on the base (bottom) of your toes, or the bottom of your foot (plantar fascia), or both. Hold this stretch for__________ seconds. Slowly release your toes and return to the starting position. Repeat __________ times. Complete this exercise __________ times a day. Gastrocnemius stretch, standing This exercise is also called a calf (gastroc) stretch. It stretches the muscles in the back of the upper calf. Stand with your hands against a wall. Extend your left / right leg behind you, and bend your front knee slightly. Keeping your heels on the floor, your toes facing forward, and your back knee straight, shift your weight toward the wall. Do not arch your back. You should feel a gentle stretch in your upper calf. Hold this position for __________ seconds. Repeat __________ times. Complete this exercise __________ times a day. Soleus stretch, standing This exercise is also called a calf (soleus) stretch. It stretches the muscles in the back of the lower calf. Stand with your hands against a wall. Extend your left / right leg behind you, and bend your  front knee slightly. Keeping your heels on the floor and your toes facing forward, bend your back knee and shift your weight slightly over your back leg. You should feel a gentle stretch deep in your lower calf. Hold this position for __________ seconds. Repeat __________ times. Complete this exercise __________ times a day. Gastroc and soleus stretch, standing step This exercise stretches the muscles in the back of the lower leg. These muscles are in the upper calf (gastrocnemius) and the lower calf (soleus). Stand with the ball of your left / right foot on the front of a step. The ball of your foot is on the walking surface, right under your toes. Keep your other foot firmly on the same step. Hold on to the wall or a railing for balance. Slowly lift your other foot, allowing your body weight to press your heel down over the edge of the front of the step. Keep knee straight and unbent. You should feel a stretch in your calf. Hold this position for __________ seconds. Return both feet to the step. Repeat this exercise with a slight bend in your left / right knee. Repeat __________ times with your left / right knee straight and __________ times with your left / right knee bent. Complete this exercise __________ timesa day. Balance exercise This exercise builds your balance and strength control of your arch to helptake pressure off your plantar fascia. Single leg stand If this exercise is too easy, you can try it with your eyes closed or while standing on a pillow. Without shoes, stand near a railing or in   a doorway. You may hold on to the railing or door frame as needed. Stand on your left / right foot. Keep your big toe down on the floor and lift the arch of your foot. You should feel a stretch across the bottom of your foot and your arch. Do not let your foot roll inward. Hold this position for __________ seconds. Repeat __________ times. Complete this exercise __________ times a day. This  information is not intended to replace advice given to you by your health care provider. Make sure you discuss any questions you have with your healthcare provider. Document Revised: 04/22/2020 Document Reviewed: 04/22/2020 Elsevier Patient Education  2022 [employer] Plantar Fasciitis  Plantar fasciitis is a painful foot condition that affects the heel. It occurs when the band of tissue that connects the toes to the heel bone (plantar fascia) becomes irritated. This can happen as the result of exercising too much or doing other repetitive activities (overuse injury). Plantar fasciitis can cause mild irritation to severe pain that makes it difficult to walk or move. The pain is usually worse in the morning after sleeping, or after sitting or lying down for a period of time. Pain may also beworse after long periods of walking or standing. What are the causes? This condition may be caused by: Standing for long periods of time. Wearing shoes that do not have good arch support. Doing activities that put stress on joints (high-impact activities). This includes ballet and exercise that makes your heart beat faster (aerobic exercise), such as running. Being overweight. An abnormal way of walking (gait). Tight muscles in the back of your lower leg (calf). High arches in your feet or flat feet. Starting a new athletic activity. What are the signs or symptoms? The main symptom of this condition is heel pain. Pain may get worse after the following: Taking the first steps after a time of rest, especially in the morning after awakening, or after you have been sitting or lying down for a while. Long periods of standing still. Pain may decrease after 30-45 minutes of activity, such as gentle walking. How is this diagnosed? This condition may be diagnosed based on your medical history, a physical exam, and your symptoms. Your health care provider will check for: A tender area on the bottom of your  foot. A high arch in your foot or flat feet. Pain when you move your foot. Difficulty moving your foot. You may have imaging tests to confirm the diagnosis, such as: X-rays. Ultrasound. MRI. How is this treated? Treatment for plantar fasciitis depends on how severe your condition is. Treatment may include: Rest, ice, pressure (compression), and raising (elevating) the affected foot. This is called RICE therapy. Your health care provider may recommend RICE therapy along with over-the-counter pain medicines to manage your pain. Exercises to stretch your calves and your plantar fascia. A splint that holds your foot in a stretched, upward position while you sleep (night splint). Physical therapy to relieve symptoms and prevent problems in the future. Injections of steroid medicine (cortisone) to relieve pain and inflammation. Stimulating your plantar fascia with electrical impulses (extracorporeal shock wave therapy). This is usually the last treatment option before surgery. Surgery, if other treatments have not worked after 12 months. Follow these instructions at home: Managing pain, stiffness, and swelling  If directed, put ice on the painful area. To do this: Put ice in a plastic bag, or use a frozen bottle of water. Place a towel between your skin and the   bag or bottle. Roll the bottom of your foot over the bag or bottle. Do this for 20 minutes, 2-3 times a day. Wear athletic shoes that have air-sole or gel-sole cushions, or try soft shoe inserts that are designed for plantar fasciitis. Elevate your foot above the level of your heart while you are sitting or lying down.  Activity Avoid activities that cause pain. Ask your health care provider what activities are safe for you. Do physical therapy exercises and stretches as told by your health care provider. Try activities and forms of exercise that are easier on your joints (low impact). Examples include swimming, water aerobics, and  biking. General instructions Take over-the-counter and prescription medicines only as told by your health care provider. Wear a night splint while sleeping, if told by your health care provider. Loosen the splint if your toes tingle, become numb, or turn cold and blue. Maintain a healthy weight, or work with your health care provider to lose weight as needed. Keep all follow-up visits. This is important. Contact a health care provider if you have: Symptoms that do not go away with home treatment. Pain that gets worse. Pain that affects your ability to move or do daily activities. Summary Plantar fasciitis is a painful foot condition that affects the heel. It occurs when the band of tissue that connects the toes to the heel bone (plantar fascia) becomes irritated. Heel pain is the main symptom of this condition. It may get worse after exercising too much or standing still for a long time. Treatment varies, but it usually starts with rest, ice, pressure (compression), and raising (elevating) the affected foot. This is called RICE therapy. Over-the-counter medicines can also be used to manage pain. This information is not intended to replace advice given to you by your health care provider. Make sure you discuss any questions you have with your healthcare provider. Document Revised: 10/27/2019 Document Reviewed: 10/27/2019 Elsevier Patient Education  2022 [employer]  

## 2021-04-13 NOTE — Progress Notes (Signed)
Subjective:  Patient ID: Laurie Small, female    DOB: 1975/11/26,  MRN: [id number] HPI Chief Complaint  Patient presents with   Foot Pain    Pt reports she has a history of gout and plantar fasciitis- bilateral  She is having ongoing pain since her last visit in 2017-    45 y.o. female presents with the above complaint.   ROS: Denies fever chills nausea vomiting muscle aches pains calf pain back pain chest pain shortness of breath.  Past Medical History:  Diagnosis Date   Depression    NICM (nonischemic cardiomyopathy) (HCC)    2D ECHO, 12/26/2011 - EF 45-50%, left ventricle borderline dilated   Past Surgical History:  Procedure Laterality Date   CARDIAC CATHETERIZATION  01/06/2002   Dilated nonsichemic cardiopmyopathy, EF 28%, at least 2+ angiographic mitral regurgitation    Current Outpatient Medications:    allopurinol (ZYLOPRIM) 100 MG tablet, Take 1 tablet by mouth daily., Disp: , Rfl:    aspirin 325 MG tablet, Take 325 mg by mouth daily., Disp: , Rfl:    atorvastatin (LIPITOR) 40 MG tablet, Take 1 tablet (40 mg total) by mouth daily at 6 PM., Disp: 90 tablet, Rfl: 3   carvedilol (COREG) 25 MG tablet, Take 1 tablet (25 mg total) by mouth 2 (two) times daily with a meal., Disp: 180 tablet, Rfl: 3   dapagliflozin propanediol (FARXIGA) 10 MG TABS tablet, Take 1 tablet (10 mg total) by mouth daily., Disp: 90 tablet, Rfl: 3   diphenhydrAMINE (BENADRYL) 25 MG tablet, Take 25 mg by mouth daily., Disp: , Rfl:    ferrous sulfate 325 (65 FE) MG EC tablet, Take 1 tablet by mouth daily., Disp: , Rfl:    glipiZIDE (GLUCOTROL XL) 5 MG 24 hr tablet, TAKE 2 TABLETS IN AM AND 1/2 A TABLET BEFORE DINNER, Disp: 225 tablet, Rfl: 2   glucose blood (ONETOUCH VERIO) test strip, USE TO CHECK BLOOD SUGAR   TWO TIMES A DAY, Disp: 100 strip, Rfl: 3   hydrochlorothiazide (HYDRODIURIL) 25 MG tablet, Take 1 tablet (25 mg total) by mouth daily., Disp: 90 tablet, Rfl: 3   hydrocortisone 2.5 % cream, as  needed., Disp: , Rfl:    Insulin Pen Needle (BD PEN NEEDLE MICRO U/F) 32G X 6 MM MISC, Use with Victoza pen daily, Disp: 100 each, Rfl: 2   ivabradine (CORLANOR) 5 MG TABS tablet, Take 1 tablet (5 mg total) by mouth 2 (two) times daily with a meal. Need appointment, Disp: 180 tablet, Rfl: 3   metFORMIN (GLUCOPHAGE) 1000 MG tablet, Take 1 tablet (1,000 mg total) by mouth 2 (two) times daily with a meal., Disp: 180 tablet, Rfl: 3   ramipril (ALTACE) 5 MG capsule, Take 2 capsules by mouth in the morning and 1 capsule in the evening., Disp: 270 capsule, Rfl: 3   Semaglutide, 2 MG/DOSE, (OZEMPIC, 2 MG/DOSE,) 8 MG/3ML SOPN, Inject 2 mg into the skin once a week., Disp: 9 mL, Rfl: 3   spironolactone (ALDACTONE) 25 MG tablet, Take 0.5 tablets (12.5 mg total) by mouth daily., Disp: 45 tablet, Rfl: 3   TRULICITY 3 MG/0.5ML SOPN, SMARTSIG:3 Milligram(s) SUB-Q Once a Week, Disp: , Rfl:   Allergies  Allergen Reactions   Betadine [Povidone Iodine]    Iodine    Peanuts [Peanut Oil]    Shellfish Allergy    Other Hives and Rash    Sunlight and Grass    Review of Systems Objective:  There were no vitals filed for this  visit.  General: Well developed, nourished, in no acute distress, alert and oriented x3   Dermatological: Skin is warm, dry and supple bilateral. Nails x 10 are well maintained; remaining integument appears unremarkable at this time. There are no open sores, no preulcerative lesions, no rash or signs of infection present.  Vascular: Dorsalis Pedis artery and Posterior Tibial artery pedal pulses are 2/4 bilateral with immedate capillary fill time. Pedal hair growth present. No varicosities and no lower extremity edema present bilateral.   Neruologic: Grossly intact via light touch bilateral. Vibratory intact via tuning fork bilateral. Protective threshold with Semmes Wienstein monofilament intact to all pedal sites bilateral. Patellar and Achilles deep tendon reflexes 2+ bilateral. No  Babinski or clonus noted bilateral.   Musculoskeletal: No gross boney pedal deformities bilateral. No pain, crepitus, or limitation noted with foot and ankle range of motion bilateral. Muscular strength 5/5 in all groups tested bilateral.  Pain on palpation medial calcaneal tubercles bilateral.  No pain on medial lateral compression of the calcaneus.  Gait: Unassisted, Nonantalgic.    Radiographs:  Radiographs taken today demonstrate mild to moderate pes planus and osseously mature individual there is soft tissue increase in density of plantar fashion calcaneal insertion site.  No acute findings are identified.    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  Diabetes mellitus with early neuropathic changes.  Pes planovalgus.  Plan: We discussed the etiology pathology conservative surgical therapies at this point started her on use of a plantar fascial brace bilaterally and then single night splint.  I injected the bilateral heels today 20 mg Kenalog 5 mg of Marcaine to the point of maximal tenderness proximally medial plantar fascial band insertion scheduled her for casting for orthotics none like to follow-up with her in 1 month.  She knows she needs to work on getting her A1c down.     Fatin Bachicha T. [place], [place]

## 2021-04-19 ENCOUNTER — Ambulatory Visit (INDEPENDENT_AMBULATORY_CARE_PROVIDER_SITE_OTHER): Payer: No Typology Code available for payment source

## 2021-04-19 ENCOUNTER — Other Ambulatory Visit: Payer: Self-pay

## 2021-04-19 DIAGNOSIS — Q666 Other congenital valgus deformities of feet: Secondary | ICD-10-CM | POA: Diagnosis not present

## 2021-04-19 DIAGNOSIS — M722 Plantar fascial fibromatosis: Secondary | ICD-10-CM

## 2021-04-19 DIAGNOSIS — E119 Type 2 diabetes mellitus without complications: Secondary | ICD-10-CM | POA: Diagnosis not present

## 2021-04-19 NOTE — Progress Notes (Signed)
Patient in office today for custom orthotic casting. Patient feet were casted in foam box. Patient measured at shoe size 9 regular fit women's. Height 5'3" and weight 200lbs. Patient stated orthotics will be worn mostly in athletic shoes and dress shoes during the work week. Advised patient office will call when orthotics are available for pick-up. Patient verbalized understanding.

## 2021-04-22 ENCOUNTER — Other Ambulatory Visit: Payer: Self-pay | Admitting: Internal Medicine

## 2021-04-27 ENCOUNTER — Other Ambulatory Visit: Payer: Self-pay | Admitting: Internal Medicine

## 2021-04-27 MED ORDER — OZEMPIC (2 MG/DOSE) 8 MG/3ML ~~LOC~~ SOPN: 2.0000 mg | PEN_INJECTOR | SUBCUTANEOUS | 3 refills | Status: DC

## 2021-04-30 ENCOUNTER — Other Ambulatory Visit: Payer: Self-pay | Admitting: Internal Medicine

## 2021-05-02 NOTE — Telephone Encounter (Signed)
Patient will pick up sample this afternoon

## 2021-05-02 NOTE — Telephone Encounter (Signed)
Product Backordered/Unavailable:BACKORDERED.

## 2021-05-17 ENCOUNTER — Ambulatory Visit (INDEPENDENT_AMBULATORY_CARE_PROVIDER_SITE_OTHER): Payer: No Typology Code available for payment source | Admitting: Internal Medicine

## 2021-05-17 ENCOUNTER — Other Ambulatory Visit: Payer: Self-pay

## 2021-05-17 ENCOUNTER — Encounter: Payer: Self-pay | Admitting: Internal Medicine

## 2021-05-17 VITALS — BP 132/80 | HR 79 | Ht 62.5 in | Wt 201.6 lb

## 2021-05-17 DIAGNOSIS — E669 Obesity, unspecified: Secondary | ICD-10-CM

## 2021-05-17 DIAGNOSIS — Z23 Encounter for immunization: Secondary | ICD-10-CM | POA: Diagnosis not present

## 2021-05-17 DIAGNOSIS — E1129 Type 2 diabetes mellitus with other diabetic kidney complication: Secondary | ICD-10-CM

## 2021-05-17 DIAGNOSIS — E785 Hyperlipidemia, unspecified: Secondary | ICD-10-CM | POA: Diagnosis not present

## 2021-05-17 DIAGNOSIS — R809 Proteinuria, unspecified: Secondary | ICD-10-CM

## 2021-05-17 LAB — POCT GLYCOSYLATED HEMOGLOBIN (HGB A1C): Hemoglobin A1C: 9.1 % — AB (ref 4.0–5.6)

## 2021-05-17 MED ORDER — FREESTYLE LIBRE 3 SENSOR MISC: 1.0000 | 3 refills | Status: DC

## 2021-05-17 NOTE — Patient Instructions (Addendum)
Please continue: - Metformin 1000 mg 2x a day, with meals - Glipizide ER XL 10 mg before breakfast and 2.5 mg before dinner - Farxiga 10 mg in am  - Ozempic 2 mg weekly  Check sugars 2x a day, rotating check times.  Please return in 1.5 months with your sugar log.

## 2021-05-17 NOTE — Progress Notes (Addendum)
Patient ID: Laurie Small, female   DOB: 1975/10/13, 45 y.o.   MRN: [id number]  This visit occurred during the SARS-CoV-2 public health emergency.  Safety protocols were in place, including screening questions prior to the visit, additional usage of staff PPE, and extensive cleaning of exam room while observing appropriate contact time as indicated for disinfecting solutions.   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 Last visit 4 mo ago.  Interim history: She saw Cristy Folks  for nutrition advice in the past and sugars improved afterwards.  However, before last visit, they started to worsen again.  At that time, sugars were higher as she had to come off Ozempic due to delay in getting it filled at the pharmacy. Since last visit, she developed plantar fasciitis. No increased urination, blurry vision, nausea, chest pain. She started iron tx.  Reviewed HbA1c levels: Lab Results  Component Value Date   HGBA1C 9.1 (A) 01/04/2021   HGBA1C 8.1 (A) 09/06/2020   HGBA1C 7.4 (A) 03/26/2020  09/13/2020: HbA1c calculated from fructosamine is 5.9%, much better than the directly measured HbA1c, although slightly lower than expected from her log 05/23/2019: HbA1c calculated from fructosamine is 5.77%, in this case, however, discrepant with the sugars at home. 05/20/2018: HbA1c calculated from the fructosamine is 6.27%, still excellent. 01/07/2018: HbA1c calculated from fructosamine is 6.1% (great, slightly higher than before!) 03/22/2017: HbA1c calculated from fructosamine is 5.9%!  12/14/2016: HbA1c calculated from fructosamine is 6.6%. 09/14/2016: HbA1c calculated from fructosamine is MUCH lower, at 6.8%! 05/31/2015: HbA1c 10% 09/15/2014: HbA1c 9.6% 04/23/2014: HbA1c 10.9% 01/20/2014: HbA1c 12.9%  Pt is on a regimen of: -  Metformin 1000 mg 2x a day, with meals - Glipizide ER XL 10 mg before breakfast and 2.5 mg before dinner - Farxiga 10 mg in am - Ozempic 0.5 >> 1 mg weekly >> Trulicity 3 mg weekly >> Ozempic 2 mg weekly She was on Onglyza (too expensive).  She was on JanuMet (stopped being effective), then Januvia >> stopped when starting Trulicity She was on Victoza (trial period). We changed from Victoza to Ozempic 08/2018. She refuses insulin.  Pt checks her sugars 1-2 times a day: - am: 68, 76, 82-180, 190 >> 88, 92-160, 190, 214 >> 81-189, 199, 203 - 2h after b'fast: 244 >> n/c >> 150, 184, 241 >> n/c - before lunch: 59, 127-162 >> 100, 148 >> 224 >> n/c - 2h after lunch: 245-344 >> 87, 243 >> 130, 240 >> n/c - before dinner:  101, 180-210 >> 139 >> n/c >> 130-170 >> n/c - 2h after dinner:216-225 >> 184 >> 190 >> n/c - bedtime: n/c >> 115-193, 206 >> n/c - nighttime: 85-142, 195 >> 73, 75, 214 >> n/c Lowest sugar was 90 >> 58 >> 88 >> 81; it is unclear at which level she has hypoglycemia awareness Highest sugar was 344 >> 243 >> 224 >> 200.  Glucometer: FreeStyle Ultra mini >> ReliOn  Pt's meals are: - Breakfast: fruit, yoghurt, oatmeal, eggs, grits, - Lunch: pasta, sandwich, salad - Dinner: meat + veggies - Snacks: 3  During the coronavirus pandemic, she had to change her job and this was a stressful. She works from home and dinner is usually late.  -+ h/o CKD, last BUN/creatinine:  09/13/2020: Glucose 131, BUN/creatinine 29/1.13, GFR 62 Lab Results  Component Value Date   BUN 22 03/26/2020   CREATININE 1.19 (  H) 03/26/2020  03/27/2019: 17/1.01, GFR 72, glucose 128 Lab Results  Component Value Date   GFRAA 63 03/26/2020   GFRAA 82 01/07/2018    No recent microalbuminuria: Lab Results  Component Value Date   MICRALBCREAT 2.5 03/26/2020   MICRALBCREAT 1.0 01/07/2018  03/27/2019: ACR 21.2 ACR  (09/15/2014): 44.4 ACR (01/20/2014): 79.9 On ramipril.  -+ HL; last set of  lipids: 09/17/2020: 138/93/56/64 Lab Results  Component Value Date   CHOL 133 03/26/2020   HDL 54.30 03/26/2020   LDLCALC 56 03/26/2020   TRIG 114.0 03/26/2020   CHOLHDL 2 03/26/2020  03/27/2019: 147/138/57/62 03/31/2016: 136/69/49/73 06/12/2014: 147/118/44/80 On Lipitor 40.  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   -No numbness and tingling in her feet.  She has heel spurs.  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  She stopped Digoxin >> on Corlanor now. 2D echo by her cardiologist >> EF improved to 50 to 55%.  ROS: + see HPI  I reviewed pt's medications, allergies, PMH, social hx, family hx, and changes were documented in the history of present illness. Otherwise, unchanged from my initial visit note.  Past Medical History:  Diagnosis Date   Depression    NICM (nonischemic cardiomyopathy) (HCC)    2D ECHO, 12/26/2011 - EF 45-50%, left ventricle borderline dilated   Past Surgical History:  Procedure Laterality Date   CARDIAC CATHETERIZATION  01/06/2002   Dilated nonsichemic cardiopmyopathy, EF 28%, at least 2+ angiographic mitral regurgitation   Social History   Social History   Marital Status: Married    Spouse Name: N/A   Number of Children: 1   Occupational History    claims benefits specialist    Social History Main Topics   Smoking status: Never Smoker    Smokeless tobacco: Not on file   Alcohol Use: No   Drug Use: No   Current Outpatient Medications on File Prior to Visit  Medication Sig Dispense Refill   allopurinol (ZYLOPRIM) 100 MG tablet Take 1 tablet by mouth daily.     aspirin 325 MG tablet Take 325 mg by mouth daily.     atorvastatin (LIPITOR) 40 MG tablet Take 1 tablet (40 mg total) by mouth daily at 6 PM. 90 tablet 3   carvedilol (COREG) 25 MG tablet Take 1 tablet (25 mg total) by mouth 2 (two) times daily with a meal. 180 tablet 3   dapagliflozin propanediol (FARXIGA)  10 MG TABS tablet Take 1 tablet (10 mg total) by mouth daily. 90 tablet 3   diphenhydrAMINE (BENADRYL) 25 MG tablet Take 25 mg by mouth daily.     ferrous sulfate 325 (65 FE) MG EC tablet Take 1 tablet by mouth daily.     glipiZIDE (GLUCOTROL XL) 5 MG 24 hr tablet TAKE 2 TABLETS IN AM AND 1/2 A TABLET BEFORE DINNER 225 tablet 2   glucose blood (ONETOUCH VERIO) test strip USE TO CHECK BLOOD SUGAR   TWO TIMES A DAY 100 strip 3   hydrochlorothiazide (HYDRODIURIL) 25 MG tablet Take 1 tablet (25 mg total) by mouth daily. 90 tablet 3   hydrocortisone 2.5 % cream as needed.     Insulin Pen Needle (BD PEN NEEDLE MICRO U/F) 32G X 6 MM MISC Use with Victoza pen daily 100 each 2   ivabradine (CORLANOR) 5 MG TABS tablet Take 1 tablet (5 mg total) by mouth 2 (two) times  daily with a meal. Need appointment 180 tablet 3   metFORMIN (GLUCOPHAGE) 1000 MG tablet Take 1 tablet (1,000 mg total) by mouth 2 (two) times daily with a meal. 180 tablet 3   ramipril (ALTACE) 5 MG capsule Take 2 capsules by mouth in the morning and 1 capsule in the evening. 270 capsule 3   Semaglutide, 2 MG/DOSE, (OZEMPIC, 2 MG/DOSE,) 8 MG/3ML SOPN Inject 2 mg into the skin once a week. 9 mL 3   spironolactone (ALDACTONE) 25 MG tablet Take 0.5 tablets (12.5 mg total) by mouth daily. 45 tablet 3   TRULICITY 3 MG/0.5ML SOPN SMARTSIG:3 Milligram(s) SUB-Q Once a Week     No current facility-administered medications on file prior to visit.   Allergies  Allergen Reactions   Betadine [Povidone Iodine]    Iodine    Peanuts [Peanut Oil]    Shellfish Allergy    Other Hives and Rash    Sunlight and Grass    Family History  Problem Relation Age of Onset   Hypertension Mother    GI Bleed Mother    Pneumonia Maternal Grandmother    Cancer Paternal Grandmother        Breast cancer   Breast cancer Paternal Grandmother    PE: BP 132/80 (BP Location: Right Arm, Patient Position: Sitting, Cuff Size: Normal)   Pulse 79   Ht 5' 2.5" (1.588 m)    Wt 201 lb 9.6 oz (91.4 kg)   SpO2 98%   BMI 36.29 kg/m  There is no height or weight on file to calculate BMI. Wt Readings from Last 3 Encounters:  05/17/21 201 lb 9.6 oz (91.4 kg)  01/04/21 205 lb (93 kg)  09/06/20 203 lb 6.4 oz (92.3 kg)   Constitutional: overweight, in NAD Eyes: PERRLA, EOMI, no exophthalmos ENT: moist mucous membranes, no thyromegaly, no cervical lymphadenopathy Cardiovascular: RRR, No MRG Respiratory: CTA B Gastrointestinal: abdomen soft, NT, ND, BS+ Musculoskeletal: no deformities, strength intact in all 4 Skin: moist, warm, no rashes Neurological: no tremor with outstretched hands, DTR normal in all 4  ASSESSMENT: 1. DM2, non-insulin-dependent, uncontrolled, with complications - CKD - DR  Cardiologist: Dr Rennis Golden  2. Obesity class 2 BMI Classification: < 18.5 underweight  18.5-24.9 normal weight  25.0-29.9 overweight  30.0-34.9 class I obesity  35.0-39.9 class II obesity  ? 40.0 class III obesity   3. HL  PLAN:  1. Patient with longstanding, uncontrolled, type 2 diabetes, on oral antidiabetic regimen with metformin, sulfonylurea, and SGLT2 inhibitor and also weekly GLP-1 receptor agonist. At last OV, sugars were higher after she could not get Ozempic from the pharmacy and was previously on Trulicity. I sent a new Rx for a higher dose of Ozempic, 2 mg weekly to the pharmacy.  I advised her that if the sugars improved after increasing the dose of Ozempic, to stop glipizide.  At that time, HbA1c was higher, at 9.1%. However, the directly measured HbA1c level are not very accurate for her and we usually follow her with fructosamine levels. -At this visit, she only checks blood sugars in the morning and they are mostly at goal with few hyperglycemic excursions.  Unfortunately, she did not check sugars later in the day and her HbA1c levels are not very accurate for her so it is difficult to understand patterns in her blood sugar checks.  I strongly suggested  that she check sugars at different times of the day, rotating check times, but I also suggested a CGM and I sent  a prescription for freestyle libre 2 CGM to her pharmacy.  I am hoping that this would be covered for her.  For now, I advised her to continue the same regimen and we will also check a fructosamine level.  I plan to see her back sooner, in 1.5 months with more data, to see if we need to change her regimen. - I suggested to:  Patient Instructions  Please continue: - Metformin 1000 mg 2x a day, with meals - Glipizide ER XL 10 mg before breakfast and 2.5 mg before dinner - Farxiga 10 mg in am  - Ozempic 2 mg weekly  Check sugars 2x a day, rotating check times.  Please return in 1.5 months with your sugar log.  - we checked her HbA1c: 9.1% (stable)+ will also check a fructosamine level  - advised to check sugars at different times of the day - 4x a day, rotating check times - advised for yearly eye exams >> she is UTD - return to clinic in 1.5 months  2. Obesity class 2 -She continues to walk for exercise -continue SGLT 2 inhibitor and GLP-1 receptor agonist which should also help with weight loss -she lost 4 pounds since last visit  3. HL -Reviewed latest lipid panel from 08/2020: All fractions at goal -Continues Lipitor 40 mg daily without side effects  + flu shot today Component     Latest Ref Rng & Units 05/17/2021  Hemoglobin A1C     4.0 - 5.6 % 9.1 (A)  Fructosamine     205 - 285 umol/L 318 (H)   HbA1c calculated from fructosamine is actually 7%, much better than the directly measured HbA1c.  Carlus Pavlov, MD PhD [facility] Endocrinology

## 2021-05-19 ENCOUNTER — Other Ambulatory Visit: Payer: Self-pay

## 2021-05-19 ENCOUNTER — Ambulatory Visit: Payer: No Typology Code available for payment source | Admitting: Podiatry

## 2021-05-19 DIAGNOSIS — M722 Plantar fascial fibromatosis: Secondary | ICD-10-CM

## 2021-05-19 NOTE — Progress Notes (Signed)
Patient came in to pick up orthotics, went over the use of inserts. Patient reported she will schedule appointment to follow up with Dr.Hyatt

## 2021-05-20 LAB — FRUCTOSAMINE: Fructosamine: 318 umol/L — ABNORMAL HIGH (ref 205–285)

## 2021-06-10 ENCOUNTER — Other Ambulatory Visit: Payer: Self-pay | Admitting: Internal Medicine

## 2021-06-12 ENCOUNTER — Encounter: Payer: Self-pay | Admitting: Internal Medicine

## 2021-07-04 ENCOUNTER — Encounter: Payer: Self-pay | Admitting: Internal Medicine

## 2021-07-04 ENCOUNTER — Other Ambulatory Visit: Payer: Self-pay

## 2021-07-04 ENCOUNTER — Ambulatory Visit (INDEPENDENT_AMBULATORY_CARE_PROVIDER_SITE_OTHER): Payer: No Typology Code available for payment source | Admitting: Internal Medicine

## 2021-07-04 VITALS — BP 122/80 | HR 74 | Ht 62.5 in | Wt 203.8 lb

## 2021-07-04 DIAGNOSIS — R809 Proteinuria, unspecified: Secondary | ICD-10-CM

## 2021-07-04 DIAGNOSIS — E785 Hyperlipidemia, unspecified: Secondary | ICD-10-CM

## 2021-07-04 DIAGNOSIS — E1129 Type 2 diabetes mellitus with other diabetic kidney complication: Secondary | ICD-10-CM

## 2021-07-04 DIAGNOSIS — E669 Obesity, unspecified: Secondary | ICD-10-CM | POA: Diagnosis not present

## 2021-07-04 MED ORDER — FREESTYLE LIBRE 2 SENSOR MISC: 1.0000 | 3 refills | Status: DC

## 2021-07-04 NOTE — Patient Instructions (Addendum)
Please change: - Metformin 2000 mg with dinner  Please continue: - Glipizide ER XL 10 mg before breakfast and 2.5 mg before dinner - Farxiga 10 mg in am - Ozempic 2 mg weekly/Trulicity 3 mg weekly  Try to get the Freestyle Libre 2 CGM.  Please return in 2 months with your sugar log.

## 2021-07-04 NOTE — Progress Notes (Signed)
Patient ID: Laurie Small, female   DOB: 1976-05-30, 45 y.o.   MRN: [id number]  This visit occurred during the SARS-CoV-2 public health emergency.  Safety protocols were in place, including screening questions prior to the visit, additional usage of staff PPE, and extensive cleaning of exam room while observing appropriate contact time as indicated for disinfecting solutions.   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 Last visit 1.5 mo ago.  Interim history: Before last visit, she started iron therapy.  She continues on this.  She continues to have fatigue. No increased urination, blurry vision, nausea, chest pain.   Reviewed HbA1c levels: 05/17/2021: HbA1c calculated from fructosamine is 7%, much better than the directly measured HbA1c. Lab Results  Component Value Date   HGBA1C 9.1 (A) 05/17/2021   HGBA1C 9.1 (A) 01/04/2021   HGBA1C 8.1 (A) 09/06/2020  09/13/2020: HbA1c calculated from fructosamine is 5.9%, much better than the directly measured HbA1c, although slightly lower than expected from her log 05/23/2019: HbA1c calculated from fructosamine is 5.77%, in this case, however, discrepant with the sugars at home. 05/20/2018: HbA1c calculated from the fructosamine is 6.27%, still excellent. 01/07/2018: HbA1c calculated from fructosamine is 6.1% (great, slightly higher than before!) 03/22/2017: HbA1c calculated from fructosamine is 5.9%!  12/14/2016: HbA1c calculated from fructosamine is 6.6%. 09/14/2016: HbA1c calculated from fructosamine is MUCH lower, at 6.8%! 05/31/2015: HbA1c 10% 09/15/2014: HbA1c 9.6% 04/23/2014: HbA1c 10.9% 01/20/2014: HbA1c 12.9%  Pt is on a regimen of: - Metformin 1000 mg 2x a day, with meals - Glipizide ER XL 10 mg before breakfast and 2.5 mg before dinner - Farxiga 10 mg in am - Ozempic  0.5 >> 1 mg weekly >> Trulicity 3 mg weekly/Ozempic 2 mg weekly (alternates between the 2 depending on availability at the pharmacy-currently on Trulicity) She was on Onglyza (too expensive).  She was on JanuMet (stopped being effective), then Januvia >> stopped when starting Trulicity She was on Victoza (trial period). We changed from Victoza to Ozempic 08/2018. She refuses insulin.  Pt checks her sugars 1-2 times a day: - am: 81-189, 199, 203 >> 75-149, 184, 185 (missed Ozempic injetion), 224  - 2h after b'fast: 244 >> n/c >> 150, 184, 241 >> n/c >> 200, 225 - before lunch: 59, 127-162 >> 100, 148 >> 224 >> n/c >> 93, 105-180 - 2h after lunch: 245-344 >> 87, 243 >> 130, 240 >> n/c - before dinner: 139 >> n/c >> 130-170 >> n/c >> 110-125 - 2h after dinner:216-225 >> 184 >> 190 >> n/c >> 132, 274 - bedtime: n/c >> 115-193, 206 >> n/c >> 154 - nighttime: 85-142, 195 >> 73, 75, 214 >> n/c Lowest sugar was 90 >> 58 >> 88 >> 81 >> 75; it is unclear at which level she has hypoglycemia awareness Highest sugar was 344 >> 243 >> 224 >> 200  >> 274.  Glucometer: FreeStyle Ultra mini >> ReliOn  Pt's meals are: - Breakfast: fruit, yoghurt, oatmeal, eggs, grits, - Lunch: pasta, sandwich, salad - Dinner: meat + veggies - Snacks: 3  During the coronavirus pandemic, she had to change her job and this was a stressful. She works from home and dinner is usually late.  -+ h/o CKD, last BUN/creatinine:  09/13/2020: Glucose 131, BUN/creatinine 29/1.13, GFR 62 Lab Results  Component Value Date   BUN 22 03/26/2020   CREATININE 1.19 (H) 03/26/2020  03/27/2019:  17/1.01, GFR 72, glucose 128 Lab Results  Component Value Date   GFRAA 63 03/26/2020   GFRAA 82 01/07/2018    No recent microalbuminuria: Lab Results  Component Value Date   MICRALBCREAT 2.5 03/26/2020   MICRALBCREAT 1.0 01/07/2018  03/27/2019: ACR 21.2 ACR  (09/15/2014): 44.4 ACR (01/20/2014): 79.9 On ramipril.  -+ HL; last set of  lipids: 09/17/2020: 138/93/56/64 Lab Results  Component Value Date   CHOL 133 03/26/2020   HDL 54.30 03/26/2020   LDLCALC 56 03/26/2020   TRIG 114.0 03/26/2020   CHOLHDL 2 03/26/2020  03/27/2019: 147/138/57/62 03/31/2016: 136/69/49/73 06/12/2014: 147/118/44/80 On Lipitor 40.  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   -No numbness and tingling in her feet.  She has heel spurs.  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  She stopped Digoxin >> on Corlanor now. 2D echo by her cardiologist >> EF improved to 50 to 55%.  ROS: + see HPI  I reviewed pt's medications, allergies, PMH, social hx, family hx, and changes were documented in the history of present illness. Otherwise, unchanged from my initial visit note.  Past Medical History:  Diagnosis Date   Depression    NICM (nonischemic cardiomyopathy) (HCC)    2D ECHO, 12/26/2011 - EF 45-50%, left ventricle borderline dilated   Past Surgical History:  Procedure Laterality Date   CARDIAC CATHETERIZATION  01/06/2002   Dilated nonsichemic cardiopmyopathy, EF 28%, at least 2+ angiographic mitral regurgitation   Social History   Social History   Marital Status: Married    Spouse Name: N/A   Number of Children: 1   Occupational History    claims benefits specialist    Social History Main Topics   Smoking status: Never Smoker    Smokeless tobacco: Not on file   Alcohol Use: No   Drug Use: No   Current Outpatient Medications on File Prior to Visit  Medication Sig Dispense Refill   allopurinol (ZYLOPRIM) 100 MG tablet Take 1 tablet by mouth daily.     aspirin 325 MG tablet Take 325 mg by mouth daily.     atorvastatin (LIPITOR) 40 MG tablet Take 1 tablet (40 mg total) by mouth daily at 6 PM. 90 tablet 3   carvedilol (COREG) 25 MG tablet Take 1 tablet (25 mg total) by mouth 2 (two) times daily with a meal. 180 tablet 3   Continuous Blood Gluc Sensor (FREESTYLE  LIBRE 3 SENSOR) MISC 1 each by Does not apply route every 14 (fourteen) days. Place 1 sensor on the skin every 14 days. Use to check glucose continuously 6 each 3   CORLANOR 5 MG TABS tablet TAKE 1 TABLET (5 MG TOTAL) BY MOUTH 2 (TWO) TIMES DAILY WITH A MEAL. NEED APPOINTMENT 60 tablet 11   dapagliflozin propanediol (FARXIGA) 10 MG TABS tablet Take 1 tablet (10 mg total) by mouth daily. 90 tablet 3   diphenhydrAMINE (BENADRYL) 25 MG tablet Take 25 mg by mouth daily.     ferrous sulfate 325 (65 FE) MG EC tablet Take 1 tablet by mouth daily.     glipiZIDE (GLUCOTROL XL) 5 MG 24 hr tablet TAKE 2 TABLETS IN AM AND 1/2 A TABLET BEFORE DINNER 225 tablet 2   glucose blood (ONETOUCH VERIO) test strip USE TO CHECK BLOOD SUGAR   TWO TIMES A DAY 100 strip 3   hydrochlorothiazide (HYDRODIURIL) 25 MG tablet Take 1 tablet (  25 mg total) by mouth daily. 90 tablet 3   hydrocortisone 2.5 % cream as needed.     Insulin Pen Needle (BD PEN NEEDLE MICRO U/F) 32G X 6 MM MISC Use with Victoza pen daily 100 each 2   metFORMIN (GLUCOPHAGE) 1000 MG tablet Take 1 tablet (1,000 mg total) by mouth 2 (two) times daily with a meal. 180 tablet 3   ramipril (ALTACE) 5 MG capsule Take 2 capsules by mouth in the morning and 1 capsule in the evening. 270 capsule 3   Semaglutide, 2 MG/DOSE, (OZEMPIC, 2 MG/DOSE,) 8 MG/3ML SOPN Inject 2 mg into the skin once a week. 9 mL 3   spironolactone (ALDACTONE) 25 MG tablet Take 0.5 tablets (12.5 mg total) by mouth daily. 45 tablet 3   TRULICITY 3 MG/0.5ML SOPN SMARTSIG:3 Milligram(s) SUB-Q Once a Week     No current facility-administered medications on file prior to visit.   Allergies  Allergen Reactions   Betadine [Povidone Iodine]    Iodine    Peanuts [Peanut Oil]    Shellfish Allergy    Other Hives and Rash    Sunlight and Grass    Family History  Problem Relation Age of Onset   Hypertension Mother    GI Bleed Mother    Pneumonia Maternal Grandmother    Cancer Paternal  Grandmother        Breast cancer   Breast cancer Paternal Grandmother    PE: BP 122/80 (BP Location: Right Arm, Patient Position: Sitting, Cuff Size: Normal)   Pulse 74   Ht 5' 2.5" (1.588 m)   Wt 203 lb 12.8 oz (92.4 kg)   SpO2 99%   BMI 36.68 kg/m   Wt Readings from Last 3 Encounters:  07/04/21 203 lb 12.8 oz (92.4 kg)  05/17/21 201 lb 9.6 oz (91.4 kg)  01/04/21 205 lb (93 kg)   Constitutional: overweight, in NAD Eyes: PERRLA, EOMI, no exophthalmos ENT: moist mucous membranes, no thyromegaly, no cervical lymphadenopathy Cardiovascular: RRR, No MRG Respiratory: CTA B/ Musculoskeletal: no deformities, strength intact in all 4 Skin: moist, warm, no rashes Neurological: no tremor with outstretched hands, DTR normal in all 4  ASSESSMENT: 1. DM2, non-insulin-dependent, uncontrolled, with complications - CKD - DR  Cardiologist: Dr Rennis Golden  2. Obesity class 2 BMI Classification: < 18.5 underweight  18.5-24.9 normal weight  25.0-29.9 overweight  30.0-34.9 class I obesity  35.0-39.9 class II obesity  ? 40.0 class III obesity   3. HL  PLAN:  1. Patient with longstanding, uncontrolled, type 2 diabetes, on oral antidiabetic regimen with metformin, sulfonylurea, SGLT2 inhibitor and also weekly GLP-1 receptor agonist.  Sugars are higher in the past when she could not get Ozempic from the pharmacy. At last visit she was only checking sugars in the morning, but these were mostly at goal with only few hyperglycemic excursions.  Unfortunately, we were not able to change her regimen at that time, due to lack of data.  I did suggest a CGM and sent the prescription for the freestyle libre 2 CGM to her pharmacy.  I directly measured HbA1c was 9.1%, but we checked a fructosamine level and this was actually 7%, at goal. -At today's visit, sugars appear to be better than before, mostly at goal but with few hyperglycemic exceptions.  Her sugars are high in the morning when she eats a late dinner.   We discussed about trying to eat dinners earlier and make these smaller.  Also, advised her to move the entire  daily metformin dose with dinner.  She tells me she tolerates well the 1000 mg tablet.  I advised her to let me know if she has problems tolerating the higher dose, in which case we can switch to metformin extended release.  For now, we will continue the rest of the regimen.  She is currently on Trulicity, previously on Ozempic.  I am hoping that after Ozempic becomes again available, she can switch back to this, since this was more potent than Trulicity. - I suggested to:  Patient Instructions  Please change: - Metformin 2000 mg with dinner  Please continue: - Glipizide ER XL 10 mg before breakfast and 2.5 mg before dinner - Farxiga 10 mg in am - Ozempic 2 mg weekly/Trulicity 3 mg weekly  Try to get the Freestyle Libre 2 CGM.  Please return in 2 months with your sugar log.   - advised to check sugars at different times of the day - 4x a day, rotating check times - advised for yearly eye exams >> she is UTD - return to clinic in 3 months  2. Obesity class 2 -She continues to walk for exercise -continue SGLT 2 inhibitor and GLP-1 receptor agonist which should also help with weight loss -She lost 4 pounds before last visit but gained 2 pounds since last visit.  3. HL -Reviewed latest lipid panel from 08/2020: Fractions at goal -She continues on Lipitor 40 mg daily without side effects  Carlus Pavlov, MD PhD [facility] Endocrinology

## 2021-07-08 ENCOUNTER — Ambulatory Visit: Payer: No Typology Code available for payment source | Admitting: Internal Medicine

## 2021-08-12 ENCOUNTER — Other Ambulatory Visit: Payer: Self-pay

## 2021-08-12 ENCOUNTER — Ambulatory Visit (INDEPENDENT_AMBULATORY_CARE_PROVIDER_SITE_OTHER): Payer: No Typology Code available for payment source | Admitting: Internal Medicine

## 2021-08-12 ENCOUNTER — Encounter: Payer: Self-pay | Admitting: Internal Medicine

## 2021-08-12 VITALS — BP 138/80 | HR 77 | Resp 20 | Ht 62.0 in | Wt 207.0 lb

## 2021-08-12 DIAGNOSIS — I10 Essential (primary) hypertension: Secondary | ICD-10-CM | POA: Diagnosis not present

## 2021-08-12 DIAGNOSIS — I428 Other cardiomyopathies: Secondary | ICD-10-CM

## 2021-08-12 DIAGNOSIS — R000 Tachycardia, unspecified: Secondary | ICD-10-CM

## 2021-08-12 NOTE — Patient Instructions (Signed)
Medication Instructions:  Your physician recommends that you continue on your current medications as directed. Please refer to the Current Medication list given to you today.  *If you need a refill on your cardiac medications before your next appointment, please call your pharmacy*  Testing/Procedures: Your physician has requested that you have an echocardiogram. Echocardiography is a painless test that uses sound waves to create images of your heart. It provides your doctor with information about the size and shape of your heart and how well your hearts chambers and valves are working. This procedure takes approximately one hour. There are no restrictions for this procedure. -- schedule at [address] - [address] Floor   Follow-Up: At [facility], you and your health needs are our priority.  As part of our continuing mission to provide you with exceptional heart care, we have created designated Provider Care Teams.  These Care Teams include your primary Cardiologist (physician) and Advanced Practice Providers (APPs -  Physician Assistants and Nurse Practitioners) who all work together to provide you with the care you need, when you need it.  We recommend signing up for the patient portal called "MyChart".  Sign up information is provided on this After Visit Summary.  MyChart is used to connect with patients for Virtual Visits (Telemedicine).  Patients are able to view lab/test results, encounter notes, upcoming appointments, etc.  Non-urgent messages can be sent to your provider as well.   To learn more about what you can do with MyChart, go to [email or web address].    Your next appointment:   1 year with Dr. Rennis Golden

## 2021-08-12 NOTE — Progress Notes (Signed)
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 In 2011, her EF was 45% to 50% by echocardiogram with medical therapy. Unfortunately, after her initial diagnosis, she developed a pulmonary embolus in 2003 and was on Coumadin for about 2 years, and now is on aspirin 325 mg daily. She also has diabetes, hypertension, and obesity. She recently she tripped in her house and developed a Small stress fracture and sprain of her left ankle which she has recovered from. She denies any shortness of breath with exertion, chest pain, palpitations, presyncope, syncopal symptoms.  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 She continues to feel at baseline. Interestingly, her heart rate is lower than it was at her last office visit.  Laurie Small returns today for followup. She reports doing well denies any shortness of breath or chest pain with exertion. Unfortunately she's not been able to lose any significant amount of weight. She has had some adjustments in her diabetes medicine with the addition of glipizide, she is also taking Onglyza and metformin 1000 mg twice daily.  She is interested in possibly getting off of some of her medications.  Saw Laurie Small back in the office today. Overall she is doing well without any symptoms of heart failure. Blood sugar has been better controlled. Weight appears to be down just a few pounds. EKG looks normal today. Her last echocardiogram however did not show significant improvement in EF, therefore I recommended we remain on her current heart failure medications. Currently she has NYHA class I symptoms.  04/28/2016  Laurie Small returns today for  follow-up. In the past year she's done fairly well. She denies any chest pain or worsening shortness of breath. Unfortunately she's been struggling with left plantar fasciitis and she is in a walking boot today. She's actually lost 46 pounds since we last saw her. She continues to have class I heart failure symptoms. Interestingly she is tachycardic today. Initially this was thought to be due to her rushing in the office however her heart rate remained elevated even at the end of the visit. She reports compliance with her beta blocker which is actually a high dose. I don't know if this is related to worsening heart failure with sympathetic activation. She was supposed to have an echo a year ago but we did not obtain that and I like to reassess LV function since his been 2 years since her last study. Her last EF was 40-45%. Stress new heart failure treatments that are available today including Entresto and Corlanor - which may be of some benefit.  06/21/2016  Laurie Small was seen in follow-up today. A repeat echo still shows her LVEF to be around 40%. She remains persistently tachycardiac with HR in the upper 90's and low 100's. This appears to be sinus. Blood pressure runs in the 110-120 systolic range and there is little room to increase her carvedilol which is already at 25 mg BID. She was on digoxin, but it did not seem to affect her heartrate, so I discontinued it. She may be a candidate for Corlanor. A big question is whether she has persistent, inappropriate sinus tachycardia and whether this is contributing to her cardiomyopathy.  07/28/2016  Mrs. Laurie Grant  Salomon Small returns today for follow-up. Her heart rate remains in the upper 90s. Her monitor showed heart rates between the 70s and 130s, however she is persistently in the low 90s and 100s. Blood pressure is a little higher today and she may tolerate an increase in her carvedilol. We discussed adding Corlanor, however she is not interested this time  due to cost issues.  08/29/2016  Laurie Small returns for follow-up. I increased her carvedilol and blood pressure now is more in the 110-120 systolic range. Heart rate remains elevated in the 80s and 90s. This is actually on except behind given her low ejection fraction of 40%.  I discussed this with Dr. Gala Romney and although EF is not less than 35%, there is probably good reason to consider trying it for HR improvement.  09/28/2016  Laurie Small returns today for follow-up. Interestingly her heart rate has improved in general on Ivabradine. It appears from her home measurements that her heart rate is somewhere around 70. She is on the 5 mg twice a day dosing. The only side effect she notices are the visual disturbances which seems to be worse in the morning but gets better fairly quickly.  03/30/2017  Laurie Small was seen today in follow-up. She seems to noted an improvement in her heart rate on Ivabradine. Heart rate today is 67 and in general she's not had any significant tachycardia or inappropriate increases in heart rate with minimal exertion. In addition she is on carvedilol. She denies any chest pain. She occasionally has some visual disturbances in the morning however it seems to be better as the day goes on and not associated to the dose of medicine.  04/12/2018  Laurie Small returns today for follow-up.  Overall she continues to do really well.  She is tolerated ivabradine with improvement in heart rate.  Her last echo showed her EF up to 50 to 55% as of October 2018.  She is asymptomatic with NYHA class I symptoms.  Blood pressure is well controlled today.  06/02/2020  Laurie Small is seen today in follow-up.  Overall she is doing well.  Her heart function had returned to near normal at 50 to 55% in 2018.  Since then she has had no heart failure symptoms.  Heart rate control has been consistent in the mid 70s on ivabradine and high dose carvedilol.  She is also on  ramipril and Aldactone.  I am hesitant to change her medications since she is done well with it.  08/12/2021  Laurie Small returns today for follow-up.  She seems to be doing well.  Her last echo was in 2018 at which time her EF was low normal which demonstrated marked improvement.  The this is related to the number of medications she is using.  She has been on the Corlanor which has helped with her tachycardia and I think that was an issue possibly related to her nonischemic cardiomyopathy.  She continues to exercise and is asymptomatic.  Blood pressure is well controlled.  Her lipids were at target with total 138, HDL 56, triglycerides 93 and LDL 64.  The only outstanding issue was her hemoglobin A1c at 9.1%.  She is seeing Laurie Small with endocrinology who is working on this.  PMHx:  Past Medical History:  Diagnosis Date   Depression    NICM (nonischemic cardiomyopathy) (HCC)    2D ECHO, 12/26/2011 - EF 45-50%, left ventricle borderline dilated    Past Surgical History:  Procedure Laterality  Date   CARDIAC CATHETERIZATION  01/06/2002   Dilated nonsichemic cardiopmyopathy, EF 28%, at least 2+ angiographic mitral regurgitation    FAMHx:  Family History  Problem Relation Age of Onset   Hypertension Mother    GI Bleed Mother    Pneumonia Maternal Grandmother    Cancer Paternal Grandmother        Breast cancer   Breast cancer Paternal Grandmother     SOCHx:   reports that she has never smoked. She has never used smokeless tobacco. She reports that she does not drink alcohol and does not use drugs.  ALLERGIES:  Allergies  Allergen Reactions   Betadine [Povidone Iodine]    Iodine    Peanuts [Peanut Oil]    Shellfish Allergy    Other Hives and Rash    Sunlight and Grass     ROS: Pertinent items noted in HPI and remainder of comprehensive ROS otherwise negative.  HOME MEDS: Current Outpatient Medications  Medication Sig Dispense Refill   allopurinol (ZYLOPRIM) 100 MG  tablet Take 1 tablet by mouth daily.     aspirin 325 MG tablet Take 325 mg by mouth daily.     atorvastatin (LIPITOR) 40 MG tablet Take 1 tablet (40 mg total) by mouth daily at 6 PM. 90 tablet 3   carvedilol (COREG) 25 MG tablet Take 1 tablet (25 mg total) by mouth 2 (two) times daily with a meal. 180 tablet 3   Continuous Blood Gluc Sensor (FREESTYLE LIBRE 2 SENSOR) MISC 1 each by Does not apply route every 14 (fourteen) days. 6 each 3   CORLANOR 5 MG TABS tablet TAKE 1 TABLET (5 MG TOTAL) BY MOUTH 2 (TWO) TIMES DAILY WITH A MEAL. NEED APPOINTMENT 60 tablet 11   dapagliflozin propanediol (FARXIGA) 10 MG TABS tablet Take 1 tablet (10 mg total) by mouth daily. 90 tablet 3   diphenhydrAMINE (BENADRYL) 25 MG tablet Take 25 mg by mouth daily.     ferrous sulfate 325 (65 FE) MG EC tablet Take 1 tablet by mouth daily.     glipiZIDE (GLUCOTROL XL) 5 MG 24 hr tablet TAKE 2 TABLETS IN AM AND 1/2 A TABLET BEFORE DINNER 225 tablet 2   glucose blood (ONETOUCH VERIO) test strip USE TO CHECK BLOOD SUGAR   TWO TIMES A DAY 100 strip 3   hydrochlorothiazide (HYDRODIURIL) 25 MG tablet Take 1 tablet (25 mg total) by mouth daily. 90 tablet 3   hydrocortisone 2.5 % cream as needed.     Insulin Pen Needle (BD PEN NEEDLE MICRO U/F) 32G X 6 MM MISC Use with Victoza pen daily 100 each 2   metFORMIN (GLUCOPHAGE) 1000 MG tablet Take 1 tablet (1,000 mg total) by mouth 2 (two) times daily with a meal. 180 tablet 3   ramipril (ALTACE) 5 MG capsule Take 2 capsules by mouth in the morning and 1 capsule in the evening. 270 capsule 3   Semaglutide, 2 MG/DOSE, (OZEMPIC, 2 MG/DOSE,) 8 MG/3ML SOPN Inject 2 mg into the skin once a week. 9 mL 3   spironolactone (ALDACTONE) 25 MG tablet Take 0.5 tablets (12.5 mg total) by mouth daily. 45 tablet 3   TRULICITY 3 MG/0.5ML SOPN SMARTSIG:3 Milligram(s) SUB-Q Once a Week     No current facility-administered medications for this visit.    LABS/IMAGING: No results found for this or any  previous visit (from the past 48 hour(s)). No results found.  VITALS: BP 138/80 (BP Location: Left Arm, Patient Position: Sitting, Cuff Size:  Normal)    Pulse 77    Resp 20    Ht 5\' 2"  (1.575 m)    Wt 207 lb (93.9 kg)    SpO2 98%    BMI 37.86 kg/m   EXAM: General appearance: alert and no distress Neck: no carotid bruit, no JVD and thyroid not enlarged, symmetric, no tenderness/mass/nodules Lungs: clear to auscultation bilaterally Heart: regular rate and rhythm, S1, S2 normal, no murmur, click, rub or gallop Abdomen: soft, non-tender; bowel sounds normal; no masses,  no organomegaly and obese Extremities: extremities normal, atraumatic, no cyanosis or edema Pulses: 2+ and symmetric Skin: Skin color, texture, turgor normal. No rashes or lesions Neurologic: Grossly normal Psych: Pleasant  EKG: Normal sinus rhythm 77, incomplete right bundle branch block and left anterior fascicular block-personally reviewed  ASSESSMENT: Nonischemic cardiomyopathy, EF 40-45% -> 50-55% (04/2017) - NYHA class I symptoms Inappropriate sinus tachycardia History of pulmonary embolism Diabetes type 2 Hypertension Obesity  PLAN: 1.    Ms. Norfolk seems to be doing well.  Her EF, up to 50 to 55% in 2018 but she has NYHA class I symptoms.  As has been now 3 years since her last echo, would like to reassess this.  Her inappropriate tachycardia is not an issue at this point she remains on ivabradine.  Her blood sugar however recently has been elevated.  She is working with her endocrinologist on this.  Her cholesterol is at goal.  No changes to her medicines today.  Follow-up annually or sooner as necessary.  2019, MD, [facility], FACP  [place]   [facility]  Medical Director of the [facility]  [facility] Diplomate of the American Board of Clinical Lipidology Attending Cardiologist  Direct Dial: [phone]   Fax: [phone]  Website:   [email or web address]   [phone] Davan Hark 08/12/2021, 2:28 PM

## 2021-08-21 ENCOUNTER — Other Ambulatory Visit: Payer: Self-pay | Admitting: Internal Medicine

## 2021-08-22 ENCOUNTER — Other Ambulatory Visit: Payer: Self-pay | Admitting: Internal Medicine

## 2021-08-22 DIAGNOSIS — E1129 Type 2 diabetes mellitus with other diabetic kidney complication: Secondary | ICD-10-CM

## 2021-08-22 DIAGNOSIS — R809 Proteinuria, unspecified: Secondary | ICD-10-CM

## 2021-08-23 ENCOUNTER — Ambulatory Visit (HOSPITAL_COMMUNITY): Payer: No Typology Code available for payment source | Attending: Cardiology

## 2021-08-23 ENCOUNTER — Other Ambulatory Visit: Payer: Self-pay

## 2021-08-23 DIAGNOSIS — I428 Other cardiomyopathies: Secondary | ICD-10-CM | POA: Insufficient documentation

## 2021-08-24 LAB — ECHOCARDIOGRAM COMPLETE
Area-P 1/2: 3.12 cm2
S' Lateral: 3.9 cm

## 2021-08-25 ENCOUNTER — Other Ambulatory Visit: Payer: Self-pay | Admitting: Internal Medicine

## 2021-08-25 ENCOUNTER — Encounter: Payer: Self-pay | Admitting: Internal Medicine

## 2021-08-25 DIAGNOSIS — E1129 Type 2 diabetes mellitus with other diabetic kidney complication: Secondary | ICD-10-CM

## 2021-08-26 MED ORDER — TRULICITY 1.5 MG/0.5ML ~~LOC~~ SOAJ: 3.0000 mg | SUBCUTANEOUS | 2 refills | Status: DC

## 2021-09-05 ENCOUNTER — Ambulatory Visit: Payer: No Typology Code available for payment source | Admitting: Internal Medicine

## 2021-09-08 ENCOUNTER — Other Ambulatory Visit: Payer: Self-pay | Admitting: Internal Medicine

## 2021-09-16 ENCOUNTER — Encounter: Payer: Self-pay | Admitting: Internal Medicine

## 2021-09-16 ENCOUNTER — Other Ambulatory Visit: Payer: Self-pay

## 2021-09-16 ENCOUNTER — Ambulatory Visit (INDEPENDENT_AMBULATORY_CARE_PROVIDER_SITE_OTHER): Payer: No Typology Code available for payment source | Admitting: Internal Medicine

## 2021-09-16 VITALS — BP 120/68 | HR 77 | Ht 62.0 in | Wt 205.4 lb

## 2021-09-16 DIAGNOSIS — E785 Hyperlipidemia, unspecified: Secondary | ICD-10-CM | POA: Diagnosis not present

## 2021-09-16 DIAGNOSIS — E1129 Type 2 diabetes mellitus with other diabetic kidney complication: Secondary | ICD-10-CM

## 2021-09-16 DIAGNOSIS — E669 Obesity, unspecified: Secondary | ICD-10-CM

## 2021-09-16 DIAGNOSIS — R809 Proteinuria, unspecified: Secondary | ICD-10-CM | POA: Diagnosis not present

## 2021-09-16 LAB — POCT GLYCOSYLATED HEMOGLOBIN (HGB A1C): Hemoglobin A1C: 9.8 % — AB (ref 4.0–5.6)

## 2021-09-16 MED ORDER — FIASP FLEXTOUCH 100 UNIT/ML ~~LOC~~ SOPN: 5.0000 [IU] | PEN_INJECTOR | Freq: Every day | SUBCUTANEOUS | 3 refills | Status: DC

## 2021-09-16 MED ORDER — INSULIN PEN NEEDLE 32G X 4 MM MISC: 3 refills | Status: DC

## 2021-09-16 NOTE — Progress Notes (Signed)
Patient ID: Laurie Small, female   DOB: October 30, 1975, 46 y.o.   MRN: [id number]  This visit occurred during the SARS-CoV-2 public health emergency.  Safety protocols were in place, including screening questions prior to the visit, additional usage of staff PPE, and extensive cleaning of exam room while observing appropriate contact time as indicated for disinfecting solutions.   HPI: Laurie Small is a 46 y.o.-year-old female, presenting for f/u for DM2, dx in 2003 (after an episode of myocarditis), non-insulin-dependent, uncontrolled, with complications (microalbuminuria, DR). Last visit 2.5 mo ago.  Interim history: She continues to have fatigue -on iron therapy. No increased urination, blurry vision, nausea, chest pain.   Reviewed HbA1c levels: 05/17/2021: HbA1c calculated from fructosamine is 7%, much better than the directly measured HbA1c. Lab Results  Component Value Date   HGBA1C 9.1 (A) 05/17/2021   HGBA1C 9.1 (A) 01/04/2021   HGBA1C 8.1 (A) 09/06/2020  09/13/2020: HbA1c calculated from fructosamine is 5.9%, much better than the directly measured HbA1c, although slightly lower than expected from her log 05/23/2019: HbA1c calculated from fructosamine is 5.77%, in this case, however, discrepant with the sugars at home. 05/20/2018: HbA1c calculated from the fructosamine is 6.27%, still excellent. 01/07/2018: HbA1c calculated from fructosamine is 6.1% (great, slightly higher than before!) 03/22/2017: HbA1c calculated from fructosamine is 5.9%!  12/14/2016: HbA1c calculated from fructosamine is 6.6%. 09/14/2016: HbA1c calculated from fructosamine is MUCH lower, at 6.8%! 05/31/2015: HbA1c 10% 09/15/2014: HbA1c 9.6% 04/23/2014: HbA1c 10.9% 01/20/2014: HbA1c 12.9%  Pt is on a regimen of: - Metformin 1000 mg 2x a day, with meals - Glipizide ER XL 10 mg before breakfast and 2.5 mg before dinner - Farxiga 10 mg in am - Trulicity 3 mg weekly/Ozempic 2 mg weekly (alternates between  the 2 ~ on availability at the pharmacy-currently on Trulicity), but ran out. She was on Onglyza (too expensive).  She was on JanuMet (stopped being effective), then Januvia >> stopped when starting Trulicity She was on Victoza (trial period). We changed from Victoza to Ozempic 08/2018. She refuses insulin.  Pt checks her sugars 1-2 times a day: - am: 81-189, 199, 203 >> 75-149, 184, 185, 224 >> 93-190 - 2h after b'fast: 150, 184, 241 >> n/c >> 200, 225 >> 215 - before lunch: 224 >> n/c >> 93, 105-180 >> 121-150, 185 - 2h after lunch: 87, 243 >> 130, 240 >> n/c >> 98, 199, 258 - before dinner: 139 >> n/c >> 130-170 >> n/c >> 110-125 >> 178 - 2h after dinner:184 >> 190 >> n/c >> 132, 274 >> n/c - bedtime: n/c >> 115-193, 206 >> n/c >> 154 >> 149-260 - nighttime: 85-142, 195 >> 73, 75, 214 >> n/c Lowest sugar was 58 ... >> 75 >> 93; it is unclear at which level she has hypoglycemia awareness Highest sugar was 344 ... >> 274 >> 260  Glucometer: FreeStyle Ultra mini >> ReliOn  Pt's meals are: - Breakfast: fruit, yoghurt, oatmeal, eggs, grits, - Lunch: pasta, sandwich, salad - Dinner: meat + veggies - Snacks: 3  During the coronavirus pandemic, she had to change her job and this was a stressful. She works from home and dinner is usually late.  -+ h/o CKD, last BUN/creatinine:  09/13/2020: Glucose 131, BUN/creatinine 29/1.13, GFR 62 Lab Results  Component Value Date   BUN 22 03/26/2020   CREATININE 1.19 (H) 03/26/2020  03/27/2019: 17/1.01, GFR 72, glucose 128 Lab Results  Component Value Date   GFRAA 63 03/26/2020   GFRAA 82 01/07/2018  No recent microalbuminuria: Lab Results  Component Value Date   MICRALBCREAT 2.5 03/26/2020   MICRALBCREAT 1.0 01/07/2018  03/27/2019: ACR 21.2 ACR  (09/15/2014): 44.4 ACR (01/20/2014): 79.9 On ramipril.  -+ HL; last set of lipids: 09/17/2020: 138/93/56/64 Lab Results  Component Value Date   CHOL 133 03/26/2020   HDL 54.30 03/26/2020    LDLCALC 56 03/26/2020   TRIG 114.0 03/26/2020   CHOLHDL 2 03/26/2020  03/27/2019: 147/138/57/62 03/31/2016: 136/69/49/73 06/12/2014: 147/118/44/80 On Lipitor 40.  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   -No numbness and tingling in her feet.  She has heel spurs. Foot exam UTD.  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  She stopped Digoxin >> on Corlanor now. 2D echo by her cardiologist >> EF improved to 50 to 55%.  ROS: + see HPI  I reviewed pt's medications, allergies, PMH, social hx, family hx, and changes were documented in the history of present illness. Otherwise, unchanged from my initial visit note.  Past Medical History:  Diagnosis Date   Depression    NICM (nonischemic cardiomyopathy) (HCC)    2D ECHO, 12/26/2011 - EF 45-50%, left ventricle borderline dilated   Past Surgical History:  Procedure Laterality Date   CARDIAC CATHETERIZATION  01/06/2002   Dilated nonsichemic cardiopmyopathy, EF 28%, at least 2+ angiographic mitral regurgitation   Social History   Social History   Marital Status: Married    Spouse Name: N/A   Number of Children: 1   Occupational History    claims benefits specialist    Social History Main Topics   Smoking status: Never Smoker    Smokeless tobacco: Not on file   Alcohol Use: No   Drug Use: No   Current Outpatient Medications on File Prior to Visit  Medication Sig Dispense Refill   hydrochlorothiazide (HYDRODIURIL) 25 MG tablet TAKE 1 TABLET DAILY 90 tablet 3   ramipril (ALTACE) 5 MG capsule TAKE 2 CAPSULES BY MOUTH IN THE MORNING AND 1 CAPSULE IN THE EVENING. 270 capsule 3   allopurinol (ZYLOPRIM) 100 MG tablet Take 1 tablet by mouth daily.     aspirin 325 MG tablet Take 325 mg by mouth daily.     atorvastatin (LIPITOR) 40 MG tablet TAKE 1 TABLET BY MOUTH DAILY AT 6 PM. 90 tablet 3   carvedilol (COREG) 25 MG tablet Take 1 tablet (25 mg total) by mouth 2  (two) times daily with a meal. 180 tablet 3   Continuous Blood Gluc Sensor (FREESTYLE LIBRE 2 SENSOR) MISC 1 each by Does not apply route every 14 (fourteen) days. 6 each 3   CORLANOR 5 MG TABS tablet TAKE 1 TABLET (5 MG TOTAL) BY MOUTH 2 (TWO) TIMES DAILY WITH A MEAL. NEED APPOINTMENT 60 tablet 11   dapagliflozin propanediol (FARXIGA) 10 MG TABS tablet Take 1 tablet (10 mg total) by mouth daily. 90 tablet 3   diphenhydrAMINE (BENADRYL) 25 MG tablet Take 25 mg by mouth daily.     Dulaglutide (TRULICITY) 1.5 MG/0.5ML SOPN Inject 3 mg into the skin once a week. 4 mL 2   ferrous sulfate 325 (65 FE) MG EC tablet Take 1 tablet by mouth daily.     glipiZIDE (GLUCOTROL XL) 5 MG 24 hr tablet TAKE 2 TABLETS IN AM AND 1/2 A TABLET BEFORE DINNER 225 tablet 2   glucose blood (ONETOUCH VERIO) test strip USE TO CHECK BLOOD SUGAR  TWO TIMES A DAY 100 strip 3   hydrocortisone 2.5 % cream as needed.     Insulin Pen Needle (BD PEN NEEDLE MICRO U/F) 32G X 6 MM MISC Use with Victoza pen daily 100 each 2   metFORMIN (GLUCOPHAGE) 1000 MG tablet TAKE 1 TABLET BY MOUTH 2 TIMES DAILY WITH A MEAL. 180 tablet 3   Semaglutide, 2 MG/DOSE, (OZEMPIC, 2 MG/DOSE,) 8 MG/3ML SOPN Inject 2 mg into the skin once a week. 9 mL 3   spironolactone (ALDACTONE) 25 MG tablet TAKE 1/2 TABLET BY MOUTH EVERY DAY 45 tablet 3   TRULICITY 3 MG/0.5ML SOPN SMARTSIG:3 Milligram(s) SUB-Q Once a Week     No current facility-administered medications on file prior to visit.   Allergies  Allergen Reactions   Betadine [Povidone Iodine]    Iodine    Peanuts [Peanut Oil]    Shellfish Allergy    Other Hives and Rash    Sunlight and Grass    Family History  Problem Relation Age of Onset   Hypertension Mother    GI Bleed Mother    Pneumonia Maternal Grandmother    Cancer Paternal Grandmother        Breast cancer   Breast cancer Paternal Grandmother    PE: BP 120/68 (BP Location: Right Arm, Patient Position: Sitting, Cuff Size: Normal)     Pulse 77    Ht 5\' 2"  (1.575 m)    Wt 205 lb 6.4 oz (93.2 kg)    SpO2 98%    BMI 37.57 kg/m   Wt Readings from Last 3 Encounters:  09/16/21 205 lb 6.4 oz (93.2 kg)  08/12/21 207 lb (93.9 kg)  07/04/21 203 lb 12.8 oz (92.4 kg)   Constitutional: overweight, in NAD Eyes: PERRLA, EOMI, no exophthalmos ENT: moist mucous membranes, no thyromegaly, no cervical lymphadenopathy Cardiovascular: RRR, No MRG Respiratory: CTA B/ Musculoskeletal: no deformities, strength intact in all 4 Skin: moist, warm, no rashes Neurological: no tremor with outstretched hands, DTR normal in all 4  ASSESSMENT: 1. DM2, non-insulin-dependent, uncontrolled, with complications - CKD - DR  Cardiologist: Dr 14/12/22  2. Obesity class 2 BMI Classification: < 18.5 underweight  18.5-24.9 normal weight  25.0-29.9 overweight  30.0-34.9 class I obesity  35.0-39.9 class II obesity  ? 40.0 class III obesity   3. HL  PLAN:  1. Patient with longstanding, uncontrolled, type 2 diabetes, on oral antidiabetic regimen with metformin, sulfonylurea, and SGLT2 inhibitor and also weekly GLP-1 receptor agonist.  Sugars were higher in the past when she could not get Ozempic from the pharmacy.  At last visit, sugars are better, mostly at goal but with few hyperglycemic exceptions.  Sugars were higher in the morning when she was eating a late dinner.  We discussed about trying to eat earlier dinners and make these smaller.  Also, we moved the entire daily metformin dose with dinner.  At last visit she was on Trulicity, as she was not able to obtain Ozempic from the pharmacy.  We did discuss about switching back to Ozempic when it becomes available since it is more potent than Trulicity.  Otherwise, we did not change her regimen.  Latest HbA1c was from 04/2021 and it was 9.1%.  An HbA1c calculated from fructosamine, however, was better, at 7%.  She is on iron therapy and this could have influenced the HbA1c result. -At last visit, I also  recommended a freestyle libre 2 CGM.  However, she mentions that this was too expensive. -As of now,  reviewing her blood sugars at home, they are fluctuating, occasionally in the normal range, but with hyperglycemic spikes above the target range, even in the mid 200s, after a late/larger meal.  At this visit, we gave her samples of Ozempic 2 mg weekly to hopefully bridge her until she can obtain it from the pharmacy, but I also advised her to start mealtime insulin before a larger meal.  Discussed about what constitutes a larger meal and how much insulin to take.  I advised her to titrate the dose up if needed. - I suggested to:  Patient Instructions  Please continue: - Metformin 2000 mg with dinner - Glipizide ER XL 10 mg before breakfast and 2.5 mg before dinner - Farxiga 10 mg in am - Ozempic 2 mg weekly/Trulicity 3 mg weekly  Try to start: - FiAsp 5-8 units before a larger meal  Please return in 2 months with your sugar log.   - we checked her HbA1c: 9.8% (even higher) - advised to check sugars at different times of the day - 1x a day, rotating check times - advised for yearly eye exams >> she is not UTD - she ws due for labs but had them this morning for PCP.  Results are pending. - return to clinic in 2 months  2. Obesity class 2 -She continues to walk for exercise -continue SGLT 2 inhibitor and GLP-1 receptor agonist which should also help with weight loss -She gained 2 pounds before last visit and 2 more since then  3. HL - Reviewed latest lipid panel from 08/2020: Fractions at goal - Continues Lipitor 40 mg daily without side effects.  Carlus Pavlov, MD PhD [facility] Endocrinology

## 2021-09-16 NOTE — Progress Notes (Signed)
Samples of this drug were given to the patient, quantity 1 box, Lot Number [id number] Exp: 01/20/22

## 2021-09-16 NOTE — Patient Instructions (Addendum)
Please continue: - Metformin 2000 mg with dinner - Glipizide ER XL 10 mg before breakfast and 2.5 mg before dinner - Farxiga 10 mg in am - Ozempic 2 mg weekly/Trulicity 3 mg weekly  Try to start: - FiAsp 5-8 units before a larger meal  Please return in 2 months with your sugar log.

## 2021-10-03 ENCOUNTER — Other Ambulatory Visit: Payer: Self-pay | Admitting: Family Medicine

## 2021-10-03 DIAGNOSIS — Z1231 Encounter for screening mammogram for malignant neoplasm of breast: Secondary | ICD-10-CM

## 2021-11-03 ENCOUNTER — Ambulatory Visit
Admission: RE | Admit: 2021-11-03 | Discharge: 2021-11-03 | Disposition: A | Discharge status: Home / Self Care | Payer: No Typology Code available for payment source | Source: Ambulatory Visit | Attending: Family Medicine | Admitting: Family Medicine

## 2021-11-03 DIAGNOSIS — Z1231 Encounter for screening mammogram for malignant neoplasm of breast: Secondary | ICD-10-CM

## 2021-11-07 ENCOUNTER — Other Ambulatory Visit: Payer: Self-pay | Admitting: Family Medicine

## 2021-11-07 DIAGNOSIS — R928 Other abnormal and inconclusive findings on diagnostic imaging of breast: Secondary | ICD-10-CM

## 2021-11-14 ENCOUNTER — Ambulatory Visit (INDEPENDENT_AMBULATORY_CARE_PROVIDER_SITE_OTHER): Payer: No Typology Code available for payment source | Admitting: Internal Medicine

## 2021-11-14 ENCOUNTER — Encounter: Payer: Self-pay | Admitting: Internal Medicine

## 2021-11-14 VITALS — BP 124/82 | HR 79 | Ht 62.0 in | Wt 202.2 lb

## 2021-11-14 DIAGNOSIS — R809 Proteinuria, unspecified: Secondary | ICD-10-CM

## 2021-11-14 DIAGNOSIS — E669 Obesity, unspecified: Secondary | ICD-10-CM

## 2021-11-14 DIAGNOSIS — E785 Hyperlipidemia, unspecified: Secondary | ICD-10-CM | POA: Diagnosis not present

## 2021-11-14 DIAGNOSIS — E1129 Type 2 diabetes mellitus with other diabetic kidney complication: Secondary | ICD-10-CM | POA: Diagnosis not present

## 2021-11-14 MED ORDER — BASAGLAR KWIKPEN 100 UNIT/ML ~~LOC~~ SOPN: 12.0000 [IU] | PEN_INJECTOR | Freq: Every day | SUBCUTANEOUS | 5 refills | Status: DC

## 2021-11-14 MED ORDER — GLIPIZIDE ER 5 MG PO TB24: ORAL_TABLET | ORAL | 2 refills | Status: DC

## 2021-11-14 NOTE — Progress Notes (Signed)
Patient ID: Laurie Small, female   DOB: 07-01-1976, 46 y.o.   MRN: [id number] ? ?This visit occurred during the SARS-CoV-2 public health emergency.  Safety protocols were in place, including screening questions prior to the visit, additional usage of staff PPE, and extensive cleaning of exam room while observing appropriate contact time as indicated for disinfecting solutions.  ? 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 Last visit 2 mo ago. ? ?Interim history: ?No increased urination, blurry vision, nausea, chest pain.  ?She had an abnormal mammogram recently and (possible asymmetry) and will go for a breast ultrasound soon. ? ?Reviewed HbA1c levels: ?Lab Results  ?Component Value Date  ? HGBA1C 9.8 (A) 09/16/2021  ? HGBA1C 9.1 (A) 05/17/2021  ? HGBA1C 9.1 (A) 01/04/2021  ?05/17/2021: HbA1c calculated from fructosamine is 7%, much better than the directly measured HbA1c. ?09/13/2020: HbA1c calculated from fructosamine is 5.9%, much better than the directly measured HbA1c, although slightly lower than expected from her log ?05/23/2019: HbA1c calculated from fructosamine is 5.77%, in this case, however, discrepant with the sugars at home. ?05/20/2018: HbA1c calculated from the fructosamine is 6.27%, still excellent. ?01/07/2018: HbA1c calculated from fructosamine is 6.1% (great, slightly higher than before!) ?03/22/2017: HbA1c calculated from fructosamine is 5.9%!  ?12/14/2016: HbA1c calculated from fructosamine is 6.6%. ?09/14/2016: HbA1c calculated from fructosamine is MUCH lower, at 6.8%! ?05/31/2015: HbA1c 10% ?09/15/2014: HbA1c 9.6% ?04/23/2014: HbA1c 10.9% ?01/20/2014: HbA1c 12.9% ? ?Pt is on a regimen of: ?- Metformin 1000 mg 2x a day, with meals ?- Glipizide ER XL 10 mg before breakfast and 2.5 mg before dinner ?- Farxiga 10 mg in am ?-  Trulicity 3 mg weekly/Ozempic 2 mg weekly (alternates between the 2 ~ on availability at the pharmacy) ?- FiAsp 5-8 units before a larger meal (added 08/2021) ?She was on Onglyza (too expensive).  ?She was on JanuMet (stopped being effective), then Januvia >> stopped when starting Trulicity ?She was on Victoza (trial period). We changed from Victoza to Ozempic 08/2018. ?She refuses insulin. ? ?Pt checks her sugars 1-2 times a day: ?- am: 81-189, 199, 203 >> 75-149, 184, 185, 224 >> 93-190 >> 147-211 ?- 2h after b'fast: 150, 184, 241 >> n/c >> 200, 225 >> 215 >> n/c ?- before lunch: 93, 105-180 >> 121-150, 185 >> 129, 250, 256 ?- 2h after lunch: 87, 243 >> 130, 240 >> n/c >> 98, 199, 258 >> 186 ?- before dinner: n/c >> 130-170 >> n/c >> 110-125 >> 178 >> n/c ?- 2h after dinner:184 >> 190 >> n/c >> 132, 274 >> n/c  ?- bedtime: n/c >> 115-193, 206 >> n/c >> 154 >> 149-260 >> 133-180 ?- nighttime: 85-142, 195 >> 73, 75, 214 >> n/c ?Lowest sugar was 58 ... >> 75 >> 93 >> 129; it is unclear at which level she has hypoglycemia awareness ?Highest sugar was 344 ... >> 274 >> 260 >> 256 ? ?Glucometer: FreeStyle Ultra mini >> ReliOn ? ?Pt's meals are: ?- Breakfast: fruit, yoghurt, oatmeal, eggs, grits, - Lunch: pasta, sandwich, salad ?- Dinner: meat + veggies ?- Snacks: 3 ? ?During the coronavirus pandemic, she had to change her job and this was a stressful. She works from home and dinner is usually late. ? ?-+ h/o CKD, last BUN/creatinine:  ?   2021-09-16    ?Albumin 4.7   3.4-4.8  ?ALP 50   38-126  ?ALT 21  0-52  ?Anion Gap 14.9   6.0-20.0  ?AST 24   0-39  ?BUN 22   6-26  ?CO2 32   22-32  ?CA-corrected 9.39   8.60-10.30  ?Calcium 10.1   8.6-10.3  ?Chloride 99   98-107  ?Creatinine 1.08   0.60-1.30  ?[id number] 63   >60  ?Glucose 188   70-99  ?Potassium 4.9   3.5-5.5  ?Sodium 141   136-145  ?Protein, Total 7.1   6.0-8.3  ?TBIL 0.4   0.3-1.0  ? ?   2021-09-16    ?MA/CR ratio 13.7   0.0-30.0  ?UCR 67      ?UMA 0.91   0.00-1.90   ? ?09/13/2020: Glucose 131, BUN/creatinine 29/1.13, GFR 62 ?Lab Results  ?Component Value Date  ? BUN 22 03/26/2020  ? CREATININE 1.19 (H) 03/26/2020  ?03/27/2019: 17/1.01, GFR 72, glucose 128 ?Lab Results  ?Component Value Date  ? GFRAA 63 03/26/2020  ? GFRAA 82 01/07/2018  ?  ?No recent microalbuminuria: ?Lab Results  ?Component Value Date  ? MICRALBCREAT 2.5 03/26/2020  ? MICRALBCREAT 1.0 01/07/2018  ?03/27/2019: ACR 21.2 ?ACR  (09/15/2014): 44.4 ?ACR (01/20/2014): 79.9 ?On ramipril. ? ?-+ HL; last set of lipids: ?   2021-09-16    ?LDL Chol Calc (NIH) 56   0-99  ?CHOL/HDL 2.3   2.0-4.0  ?Cholesterol 132   <200  ?HDLD 59   30-85  ?LDL Chol Calc (NIH) 56   0-99  ?NHDL 74   0-129  ?Triglyceride 96   0-199  ? ?09/17/2020: 138/93/56/64 ?Lab Results  ?Component Value Date  ? CHOL 133 03/26/2020  ? HDL 54.30 03/26/2020  ? LDLCALC 56 03/26/2020  ? TRIG 114.0 03/26/2020  ? CHOLHDL 2 03/26/2020  ?03/27/2019: 147/138/57/62 ?03/31/2016: 136/69/49/73 ?06/12/2014: 147/118/44/80 ?On Lipitor 40. ? 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  ? ?-No numbness and tingling in her feet.  She has heel spurs.  Latest foot exam: 03/2021. ? 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  She stopped Digoxin >> on Corlanor now. 2D echo by her cardiologist >> EF improved to 50 to 55%. ? ?ROS: ?+ see HPI ? ?I reviewed pt's medications, allergies, PMH, social hx, family hx, and changes were documented in the history of present illness. Otherwise, unchanged from my initial visit note. ? ?Past Medical History:  ?Diagnosis Date  ? Depression   ? NICM (nonischemic cardiomyopathy) (HCC)   ? 2D ECHO, 12/26/2011 - EF 45-50%, left ventricle borderline dilated  ? ?Past Surgical History:  ?Procedure Laterality Date  ? CARDIAC CATHETERIZATION  01/06/2002  ? Dilated nonsichemic cardiopmyopathy, EF 28%, at least 2+ angiographic mitral regurgitation  ? ?Social History  ? ?Social History  ? Marital  Status: Married  ?  Spouse Name: N/A  ? Number of Children: 1  ? ?Occupational History  ?  claims benefits specialist   ? ?Social History Main Topics  ? Smoking status: Never Smoker   ? Smokeless tobacco: Not on file  ? Alcohol Use: No  ? Drug Use: No  ? ?Current Outpatient Medications on File Prior to Visit  ?Medication Sig Dispense Refill  ? allopurinol (ZYLOPRIM) 100 MG tablet Take 1 tablet by mouth daily.    ? aspirin 325 MG tablet Take 325 mg by mouth daily.    ? atorvastatin (LIPITOR) 40 MG tablet TAKE 1 TABLET BY MOUTH DAILY AT 6 PM. 90 tablet 3  ?  carvedilol (COREG) 25 MG tablet Take 1 tablet (25 mg total) by mouth 2 (two) times daily with a meal. 180 tablet 3  ? Continuous Blood Gluc Sensor (FREESTYLE LIBRE 2 SENSOR) MISC 1 each by Does not apply route every 14 (fourteen) days. 6 each 3  ? CORLANOR 5 MG TABS tablet TAKE 1 TABLET (5 MG TOTAL) BY MOUTH 2 (TWO) TIMES DAILY WITH A MEAL. NEED APPOINTMENT 60 tablet 11  ? dapagliflozin propanediol (FARXIGA) 10 MG TABS tablet Take 1 tablet (10 mg total) by mouth daily. 90 tablet 3  ? diphenhydrAMINE (BENADRYL) 25 MG tablet Take 25 mg by mouth daily.    ? Dulaglutide (TRULICITY) 1.5 MG/0.5ML SOPN Inject 3 mg into the skin once a week. 4 mL 2  ? ferrous sulfate 325 (65 FE) MG EC tablet Take 1 tablet by mouth daily.    ? glipiZIDE (GLUCOTROL XL) 5 MG 24 hr tablet TAKE 2 TABLETS IN AM AND 1/2 A TABLET BEFORE DINNER 225 tablet 2  ? glucose blood (ONETOUCH VERIO) test strip USE TO CHECK BLOOD SUGAR   TWO TIMES A DAY 100 strip 3  ? hydrochlorothiazide (HYDRODIURIL) 25 MG tablet TAKE 1 TABLET DAILY 90 tablet 3  ? hydrocortisone 2.5 % cream as needed.    ? insulin aspart (FIASP FLEXTOUCH) 100 UNIT/ML FlexTouch Pen Inject 5-8 Units into the skin daily before supper. 15 mL 3  ? Insulin Pen Needle 32G X 4 MM MISC Use 1x a day 100 each 3  ? metFORMIN (GLUCOPHAGE) 1000 MG tablet TAKE 1 TABLET BY MOUTH 2 TIMES DAILY WITH A MEAL. 180 tablet 3  ? ramipril (ALTACE) 5 MG capsule TAKE  2 CAPSULES BY MOUTH IN THE MORNING AND 1 CAPSULE IN THE EVENING. 270 capsule 3  ? Semaglutide, 2 MG/DOSE, (OZEMPIC, 2 MG/DOSE,) 8 MG/3ML SOPN Inject 2 mg into the skin once a week. (Patient not taking: Rep

## 2021-11-14 NOTE — Patient Instructions (Addendum)
Please continue: ?- Metformin 2000 mg with dinner ?- Farxiga 10 mg in am ?- Ozempic 2 mg weekly/Trulicity 3 mg weekly ?- FiAsp 5-8 units before a larger meal ? ?Please add: ?- Basaglar 8 units at bedtime. Increase by 2 units every 2 days if sugars remain >140 in am. Let me know if you get to 30 units. ? ?Please decrease: ?- Glipizide ER XL 5 mg before breakfast  ? ?Please return in 3 months with your sugar log.  ?

## 2021-11-15 ENCOUNTER — Other Ambulatory Visit: Payer: Self-pay | Admitting: Internal Medicine

## 2021-11-15 DIAGNOSIS — E1129 Type 2 diabetes mellitus with other diabetic kidney complication: Secondary | ICD-10-CM

## 2021-11-23 ENCOUNTER — Other Ambulatory Visit: Payer: Self-pay | Admitting: Family Medicine

## 2021-11-23 ENCOUNTER — Ambulatory Visit
Admission: RE | Admit: 2021-11-23 | Discharge: 2021-11-23 | Disposition: A | Discharge status: Home / Self Care | Payer: No Typology Code available for payment source | Source: Ambulatory Visit | Attending: Family Medicine | Admitting: Family Medicine

## 2021-11-23 DIAGNOSIS — R928 Other abnormal and inconclusive findings on diagnostic imaging of breast: Secondary | ICD-10-CM

## 2021-12-07 ENCOUNTER — Encounter: Payer: Self-pay | Admitting: Family Medicine

## 2022-01-25 ENCOUNTER — Other Ambulatory Visit: Payer: Self-pay | Admitting: Internal Medicine

## 2022-02-17 ENCOUNTER — Ambulatory Visit: Payer: No Typology Code available for payment source | Admitting: Internal Medicine

## 2022-04-04 ENCOUNTER — Encounter: Payer: Self-pay | Admitting: Internal Medicine

## 2022-04-04 ENCOUNTER — Ambulatory Visit (INDEPENDENT_AMBULATORY_CARE_PROVIDER_SITE_OTHER): Payer: No Typology Code available for payment source | Admitting: Internal Medicine

## 2022-04-04 VITALS — BP 128/80 | HR 72 | Ht 62.0 in | Wt 203.6 lb

## 2022-04-04 DIAGNOSIS — R809 Proteinuria, unspecified: Secondary | ICD-10-CM | POA: Diagnosis not present

## 2022-04-04 DIAGNOSIS — E669 Obesity, unspecified: Secondary | ICD-10-CM | POA: Diagnosis not present

## 2022-04-04 DIAGNOSIS — E1129 Type 2 diabetes mellitus with other diabetic kidney complication: Secondary | ICD-10-CM

## 2022-04-04 DIAGNOSIS — E785 Hyperlipidemia, unspecified: Secondary | ICD-10-CM

## 2022-04-04 LAB — POCT GLYCOSYLATED HEMOGLOBIN (HGB A1C): Hemoglobin A1C: 10.2 % — AB (ref 4.0–5.6)

## 2022-04-04 MED ORDER — TIRZEPATIDE 7.5 MG/0.5ML ~~LOC~~ SOAJ: 7.5000 mg | SUBCUTANEOUS | 3 refills | Status: DC

## 2022-04-04 NOTE — Patient Instructions (Addendum)
Please continue: - Metformin 2000 mg with dinner - Glipizide ER XL 5 mg before breakfast  - Farxiga 10 mg in am  Change: - Ozempic 2 mg weekly/Trulicity 4.5 mg weekly >> try to change to Mounjaro 7.5 mg weekly - FiAsp 6-8 units before all meals if the sugars are still high after you start Mounjaro. If you take this before each meal - stop Glipizide. - Basaglar 10 units at bedtime  Please return in 3 months with your sugar log.

## 2022-04-04 NOTE — Progress Notes (Signed)
Patient ID: Laurie Small, female   DOB: 1975/08/03, 46 y.o.   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 Last visit 6 mo ago.  Interim history: No increased urination, blurry vision, nausea, chest pain.   Reviewed HbA1c levels: Lab Results  Component Value Date   HGBA1C 9.8 (A) 09/16/2021   HGBA1C 9.1 (A) 05/17/2021   HGBA1C 9.1 (A) 01/04/2021  05/17/2021: HbA1c calculated from fructosamine is 7%, much better than the directly measured HbA1c. 09/13/2020: HbA1c calculated from fructosamine is 5.9%, much better than the directly measured HbA1c, although slightly lower than expected from her log 05/23/2019: HbA1c calculated from fructosamine is 5.77%, in this case, however, discrepant with the sugars at home. 05/20/2018: HbA1c calculated from the fructosamine is 6.27%, still excellent. 01/07/2018: HbA1c calculated from fructosamine is 6.1% (great, slightly higher than before!) 03/22/2017: HbA1c calculated from fructosamine is 5.9%!  12/14/2016: HbA1c calculated from fructosamine is 6.6%. 09/14/2016: HbA1c calculated from fructosamine is MUCH lower, at 6.8%! 05/31/2015: HbA1c 10% 09/15/2014: HbA1c 9.6% 04/23/2014: HbA1c 10.9% 01/20/2014: HbA1c 12.9%  Pt is on a regimen of: - Metformin 1000 mg 2x a day, with meals - Glipizide ER XL 10 mg before breakfast and 2.5 mg before dinner >> 5 mg in am - Farxiga 10 mg in am - Trulicity 3 mg weekly/Ozempic 2 mg weekly (alternates between the 2 ~ on availability at the pharmacy) - currently Trulicity - FiAsp 5-8 units before a larger meal (added 08/2021) - Basaglar 8 units  daily -she did not increase the dose as advised at last visit. She was on Onglyza (too expensive).  She was on JanuMet (stopped being effective), then Januvia >> stopped when starting Trulicity She  was on Victoza (trial period). We changed from Victoza to Ozempic 08/2018. She refuses insulin.  Pt checks her sugars 1-2 times a day (CGM was not affordable): - am: 75-149, 184, 185, 224 >> 93-190 >> 147-211 >> 89, 107-190, 229, 243 - 2h after b'fast: 150, 184, 241 >> n/c >> 200, 225 >> 215 >> n/c >> 300 - before lunch: 93, 105-180 >> 121-150, 185 >> 129, 250, 256 >> 170 - 2h after lunch: 130, 240 >> n/c >> 98, 199, 258 >> 186 >> n/c >> 247 - before dinner: n/c >> 130-170 >> n/c >> 110-125 >> 178 >> n/c >> 170 - 2h after dinner:184 >> 190 >> n/c >> 132, 274 >> n/c  - bedtime: 115-193, 206 >> n/c >> 154 >> 149-260 >> 133-180 >> 200-300 - nighttime: 85-142, 195 >> 73, 75, 214 >> n/c Lowest sugar was 58 ... >> 75 >> 93 >> 129 >> ; it is unclear at which level she has hypoglycemia awareness Highest sugar was 344 ... >> 274 >> 260 >> 256 >> 300  Glucometer: FreeStyle Ultra mini >> ReliOn  Pt's meals are: - Breakfast: fruit, yoghurt, oatmeal, eggs, grits, - Lunch: pasta, sandwich, salad - Dinner: meat + veggies - Snacks: 3  During the coronavirus pandemic, she had to change her job and this was a stressful. She works from home and dinner is usually late.  -+ h/o CKD, last BUN/creatinine:     2021-09-16    Albumin 4.7   3.4-4.8  ALP 50   38-126  ALT 21   0-52  Anion Gap 14.9   6.0-20.0  AST 24   0-39  BUN 22  6-26  CO2 32   22-32  CA-corrected 9.39   8.60-10.30  Calcium 10.1   8.6-10.3  Chloride 99   98-107  Creatinine 1.08   0.60-1.30  [id number] 63   >60  Glucose 188   70-99  Potassium 4.9   3.5-5.5  Sodium 141   136-145  Protein, Total 7.1   6.0-8.3  TBIL 0.4   0.3-1.0      2021-09-16    MA/CR ratio 13.7   0.0-30.0  UCR 67      UMA 0.91   0.00-1.90   09/13/2020: Glucose 131, BUN/creatinine 29/1.13, GFR 62 Lab Results  Component Value Date   BUN 22 03/26/2020   CREATININE 1.19 (H) 03/26/2020  03/27/2019: 17/1.01, GFR 72, glucose 128 Lab Results  Component Value Date    GFRAA 63 03/26/2020   GFRAA 82 01/07/2018    No recent microalbuminuria: Lab Results  Component Value Date   MICRALBCREAT 2.5 03/26/2020   MICRALBCREAT 1.0 01/07/2018  03/27/2019: ACR 21.2 ACR  (09/15/2014): 44.4 ACR (01/20/2014): 79.9 On ramipril.  -+ HL; last set of lipids:    2021-09-16    LDL Chol Calc (NIH) 56   0-99  CHOL/HDL 2.3   2.0-4.0  Cholesterol 132   <200  HDLD 59   30-85  LDL Chol Calc (NIH) 56   0-99  NHDL 74   0-129  Triglyceride 96   0-199   09/17/2020: 138/93/56/64 Lab Results  Component Value Date   CHOL 133 03/26/2020   HDL 54.30 03/26/2020   LDLCALC 56 03/26/2020   TRIG 114.0 03/26/2020   CHOLHDL 2 03/26/2020  03/27/2019: 147/138/57/62 03/31/2016: 136/69/49/73 06/12/2014: 147/118/44/80 On Lipitor 40.  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   -No numbness and tingling in her feet.  She has heel spurs.  Latest foot exam: 03/2021.  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  She stopped Digoxin >> on Corlanor now. 2D echo by her cardiologist >> EF improved to 50 to 55%.  ROS: + see HPI  I reviewed pt's medications, allergies, PMH, social hx, family hx, and changes were documented in the history of present illness. Otherwise, unchanged from my initial visit note.  Past Medical History:  Diagnosis Date   Depression    NICM (nonischemic cardiomyopathy) ([place])    2D ECHO, 12/26/2011 - EF 45-50%, left ventricle borderline dilated   Past Surgical History:  Procedure Laterality Date   CARDIAC CATHETERIZATION  01/06/2002   Dilated nonsichemic cardiopmyopathy, EF 28%, at least 2+ angiographic mitral regurgitation   Social History   Social History   Marital Status: Married    Spouse Name: N/A   Number of Children: 1   Occupational History    claims benefits specialist    Social History Main Topics   Smoking status: Never Smoker    Smokeless tobacco: Not on file   Alcohol  Use: No   Drug Use: No   Current Outpatient Medications on File Prior to Visit  Medication Sig Dispense Refill   allopurinol (ZYLOPRIM) 100 MG tablet Take 1 tablet by mouth daily.     aspirin 325 MG tablet Take 325 mg by mouth daily.     atorvastatin (LIPITOR) 40 MG tablet TAKE 1 TABLET BY MOUTH DAILY AT 6 PM. 90 tablet 3   carvedilol (COREG) 25 MG tablet TAKE 1 TABLET TWICE A DAY  WITH MEALS 180 tablet 3  Continuous Blood Gluc Sensor (FREESTYLE LIBRE 2 SENSOR) MISC 1 each by Does not apply route every 14 (fourteen) days. 6 each 3   CORLANOR 5 MG TABS tablet TAKE 1 TABLET (5 MG TOTAL) BY MOUTH 2 (TWO) TIMES DAILY WITH A MEAL. NEED APPOINTMENT 60 tablet 11   diphenhydrAMINE (BENADRYL) 25 MG tablet Take 25 mg by mouth daily.     FARXIGA 10 MG TABS tablet TAKE 1 TABLET DAILY 30 tablet 11   ferrous sulfate 325 (65 FE) MG EC tablet Take 1 tablet by mouth daily.     glipiZIDE (GLUCOTROL XL) 5 MG 24 hr tablet Take 1 tablet by mouth before b'fast 90 tablet 2   hydrochlorothiazide (HYDRODIURIL) 25 MG tablet TAKE 1 TABLET DAILY 90 tablet 3   hydrocortisone 2.5 % cream as needed.     insulin aspart (FIASP FLEXTOUCH) 100 UNIT/ML FlexTouch Pen Inject 5-8 Units into the skin daily before supper. 15 mL 3   Insulin Glargine (BASAGLAR KWIKPEN) 100 UNIT/ML Inject 12 Units into the skin at bedtime. 15 mL 5   Insulin Pen Needle 32G X 4 MM MISC Use 1x a day 100 each 3   metFORMIN (GLUCOPHAGE) 1000 MG tablet TAKE 1 TABLET BY MOUTH 2 TIMES DAILY WITH A MEAL. 180 tablet 3   ONETOUCH VERIO test strip USE TO CHECK BLOOD SUGAR   TWO TIMES A DAY 100 strip 11   OZEMPIC, 2 MG/DOSE, 8 MG/3ML SOPN INJECT 2 MG INTO THE SKIN ONCE A WEEK. 9 mL 3   ramipril (ALTACE) 5 MG capsule TAKE 2 CAPSULES BY MOUTH IN THE MORNING AND 1 CAPSULE IN THE EVENING. 270 capsule 3   spironolactone (ALDACTONE) 25 MG tablet TAKE 1/2 TABLET BY MOUTH EVERY DAY 45 tablet 3   TRULICITY 3 MG/0.5ML SOPN INJECT 3 MG INTO THE SKIN ONCE A WEEK. 2 mL 11    No current facility-administered medications on file prior to visit.   Allergies  Allergen Reactions   Betadine [Povidone Iodine]    Iodine    Peanuts [Peanut Oil]    Shellfish Allergy    Other Hives and Rash    Sunlight and Grass    Family History  Problem Relation Age of Onset   Hypertension Mother    GI Bleed Mother    Pneumonia Maternal Grandmother    Cancer Paternal Grandmother        Breast cancer   Breast cancer Paternal Grandmother    PE: BP 128/80 (BP Location: Right Arm, Patient Position: Sitting, Cuff Size: Normal)   Pulse 72   Ht 5\' 2"  (1.575 m)   Wt 203 lb 9.6 oz (92.4 kg)   LMP 10/01/2020   SpO2 99%   BMI 37.24 kg/m   Wt Readings from Last 3 Encounters:  04/04/22 203 lb 9.6 oz (92.4 kg)  11/14/21 202 lb 3.2 oz (91.7 kg)  09/16/21 205 lb 6.4 oz (93.2 kg)   Constitutional: overweight, in NAD Eyes: no exophthalmos ENT: moist mucous membranes, no masses palpated in neck, no cervical lymphadenopathy Cardiovascular: RRR, No MRG Respiratory: CTA B Musculoskeletal: no deformities Skin: moist, warm, no rashes Neurological: no tremor with outstretched hands Diabetic Foot Exam - Simple   Simple Foot Form Diabetic Foot exam was performed with the following findings: Yes 04/04/2022  1:59 PM  Visual Inspection No deformities, no ulcerations, no other skin breakdown bilaterally: Yes Sensation Testing Intact to touch and monofilament testing bilaterally: Yes Pulse Check Posterior Tibialis and Dorsalis pulse intact bilaterally: Yes Comments  ASSESSMENT: 1. DM2, insulin-dependent, uncontrolled, with complications - CKD - DR  Cardiologist: Dr Rennis Golden  2. Obesity class 2 BMI Classification: < 18.5 underweight  18.5-24.9 normal weight  25.0-29.9 overweight  30.0-34.9 class I obesity  35.0-39.9 class II obesity  ? 40.0 class III obesity   3. HL  PLAN:  1. Patient with longstanding, uncontrolled, type 2 diabetes, on a complex medication regimen  including metformin, sulfonylurea, SGLT2 inhibitor and also weekly GLP-1 receptor agonist and basal-bolus insulin, with long-acting insulin added at last visit.  At that time, sugars are better after dinner after she started to take Fiasp with the majority of her dinner as blood sugars were increasing overnight, and they were higher in the morning compared to the previous visit.  They were also high before lunch.  Therefore, I advised her to Basaglar at a low dose and advised her how to type with the dose up based on the blood sugars.  We also decrease the dose of glipizide at that time to avoid low blood sugars.  HbA1c was 9.8%, higher. -At today's visit, sugars are higher than before.  She even has numbers in the 300s.  Upon questioning, she did not increase the dose of glargine as advised at last visit, so she continues on 80 units.  Will increase the dose to 10 units but I also advised her to take Fiasp before every meal, since her sugars mostly increase after meals.  In the meantime, we discussed about possibly switching from Trulicity to [place].  If she is able to do this, she may not need to increase the [place].  If she is not able to obtain [facility], we need to increase her Trulicity dose to 4.5 mg weekly or to send another prescription for Ozempic 2 mg weekly to her pharmacy.  She will let me know. -For now we will continue the stop the regimen.  I did advise her that if she takes [place] before every meal, she will not need glipizide. -A CGM is not affordable yet due to the price of her heart medications but she will let me know when this becomes affordable. - I suggested to:  Patient Instructions  Please continue: - Metformin 2000 mg with dinner - Glipizide ER XL 5 mg before breakfast  - Farxiga 10 mg in am  Change: - Ozempic 2 mg weekly/Trulicity 4.5 mg weekly >> try to change to Mounjaro 7.5 mg weekly - FiAsp 6-8 units before all meals if the sugars are still high after you start Mounjaro.  If you take this before each meal - stop Glipizide. - Basaglar 10 units at bedtime  Please return in 3 months with your sugar log.    - we checked her HbA1c: 10.2% (higher) - advised to check sugars at different times of the day - 3-4x a day, rotating check times - advised for yearly eye exams >> she is not up-to-date, but working on scheduling another exam - return to clinic in 3-4 months  2. Obesity class 2 -She continues to walk for exercise -continue SGLT 2 inhibitor and GLP-1 receptor agonist which should also help with weight loss -She lost 3 pounds before last visit and gained 1 pound since then  3. HL -Reviewed the latest lipid panel from 08/2021 and the fractions are at goal (per HPI) -Continues on Lipitor 40 mg daily without side effects  Carlus Pavlov, MD PhD [facility] Endocrinology

## 2022-05-05 ENCOUNTER — Other Ambulatory Visit: Payer: Self-pay | Admitting: Internal Medicine

## 2022-05-05 DIAGNOSIS — E1129 Type 2 diabetes mellitus with other diabetic kidney complication: Secondary | ICD-10-CM

## 2022-05-29 ENCOUNTER — Ambulatory Visit
Admission: RE | Admit: 2022-05-29 | Discharge: 2022-05-29 | Disposition: A | Discharge status: Home / Self Care | Payer: No Typology Code available for payment source | Source: Ambulatory Visit | Attending: Family Medicine | Admitting: Family Medicine

## 2022-05-29 ENCOUNTER — Other Ambulatory Visit: Payer: Self-pay | Admitting: Family Medicine

## 2022-05-29 DIAGNOSIS — R928 Other abnormal and inconclusive findings on diagnostic imaging of breast: Secondary | ICD-10-CM

## 2022-05-29 DIAGNOSIS — N6320 Unspecified lump in the left breast, unspecified quadrant: Secondary | ICD-10-CM

## 2022-06-02 ENCOUNTER — Telehealth: Payer: Self-pay | Admitting: Internal Medicine

## 2022-06-02 MED ORDER — CARVEDILOL 25 MG PO TABS: 25.0000 mg | ORAL_TABLET | Freq: Two times a day (BID) | ORAL | 0 refills | Status: DC

## 2022-06-02 MED ORDER — RAMIPRIL 5 MG PO CAPS: ORAL_CAPSULE | ORAL | 0 refills | Status: DC

## 2022-06-02 MED ORDER — HYDROCHLOROTHIAZIDE 25 MG PO TABS: 25.0000 mg | ORAL_TABLET | Freq: Every day | ORAL | 0 refills | Status: DC

## 2022-06-02 MED ORDER — CARVEDILOL 25 MG PO TABS: 25.0000 mg | ORAL_TABLET | Freq: Two times a day (BID) | ORAL | 3 refills | Status: DC

## 2022-06-02 MED ORDER — SPIRONOLACTONE 25 MG PO TABS: 12.5000 mg | ORAL_TABLET | Freq: Every day | ORAL | 0 refills | Status: DC

## 2022-06-02 NOTE — Telephone Encounter (Signed)
*  STAT* If patient is at the pharmacy, call can be transferred to refill team.   1. Which medications need to be refilled? (please list name of each medication and dose if known)   ramipril (ALTACE) 5 MG capsule  carvedilol (COREG) 25 MG tablet  spironolactone (ALDACTONE) 25 MG tablet  hydrochlorothiazide (HYDRODIURIL) 25 MG tablet    2. Which pharmacy/location (including street and city if local pharmacy) is medication to be sent to?  hydrochlorothiazide (HYDRODIURIL) 25 MG tablet   3. Do they need a 30 day or 90 day supply? 90 Day  Patient stated she still has some of these medications.  Patient has appointment scheduled on 09/15/22.

## 2022-06-02 NOTE — Telephone Encounter (Signed)
*  STAT* If patient is at the pharmacy, call can be transferred to refill team.   1. Which medications need to be refilled? (please list name of each medication and dose if known)     CORLANOR 5 MG TABS tablet   2. Which pharmacy/location (including street and city if local pharmacy) is medication to be sent to?  [id number] - [place], [place] - [address]   3. Do they need a 30 day or 90 day supply?   30 day  Patient still has some of this medication.  Patient has appointment on 09/15/22

## 2022-06-09 ENCOUNTER — Other Ambulatory Visit: Payer: Self-pay | Admitting: Internal Medicine

## 2022-07-06 ENCOUNTER — Ambulatory Visit (INDEPENDENT_AMBULATORY_CARE_PROVIDER_SITE_OTHER): Payer: No Typology Code available for payment source | Admitting: Internal Medicine

## 2022-07-06 ENCOUNTER — Encounter: Payer: Self-pay | Admitting: Internal Medicine

## 2022-07-06 VITALS — BP 128/74 | HR 71 | Ht 62.0 in | Wt 203.0 lb

## 2022-07-06 DIAGNOSIS — E785 Hyperlipidemia, unspecified: Secondary | ICD-10-CM | POA: Diagnosis not present

## 2022-07-06 DIAGNOSIS — E1129 Type 2 diabetes mellitus with other diabetic kidney complication: Secondary | ICD-10-CM

## 2022-07-06 DIAGNOSIS — E669 Obesity, unspecified: Secondary | ICD-10-CM

## 2022-07-06 DIAGNOSIS — R809 Proteinuria, unspecified: Secondary | ICD-10-CM

## 2022-07-06 LAB — POCT GLYCOSYLATED HEMOGLOBIN (HGB A1C): Hemoglobin A1C: 9.3 % — AB (ref 4.0–5.6)

## 2022-07-06 MED ORDER — ONETOUCH DELICA LANCETS 33G MISC: 3 refills | Status: AC

## 2022-07-06 NOTE — Progress Notes (Signed)
Patient ID: Laurie Small, female   DOB: 1975/09/04, 46 y.o.   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 Last visit 3 mo ago.  Interim history: No increased urination, blurry vision, nausea, chest pain.  She did not have GI symptoms after switching to [facility]. She just got new glasses.  Reviewed HbA1c levels: Lab Results  Component Value Date   HGBA1C 10.2 (A) 04/04/2022   HGBA1C 9.8 (A) 09/16/2021   HGBA1C 9.1 (A) 05/17/2021  05/17/2021: HbA1c calculated from fructosamine is 7%, much better than the directly measured HbA1c. 09/13/2020: HbA1c calculated from fructosamine is 5.9%, much better than the directly measured HbA1c, although slightly lower than expected from her log 05/23/2019: HbA1c calculated from fructosamine is 5.77%, in this case, however, discrepant with the sugars at home. 05/20/2018: HbA1c calculated from the fructosamine is 6.27%, still excellent. 01/07/2018: HbA1c calculated from fructosamine is 6.1% (great, slightly higher than before!) 03/22/2017: HbA1c calculated from fructosamine is 5.9%!  12/14/2016: HbA1c calculated from fructosamine is 6.6%. 09/14/2016: HbA1c calculated from fructosamine is MUCH lower, at 6.8%! 05/31/2015: HbA1c 10% 09/15/2014: HbA1c 9.6% 04/23/2014: HbA1c 10.9% 01/20/2014: HbA1c 12.9%  Pt is on a regimen of: - Metformin 1000 mg 2x a day, with meals - Glipizide ER XL 10 mg before breakfast and 2.5 mg before dinner >> 5 mg in am - Farxiga 10 mg in am - Trulicity 3 mg weekly/Ozempic 2 mg weekly (alternates between the 2 ~ on availability at the pharmacy) >> Mounjaro 7.5 mg weekly - FiAsp 5-8 units before a larger meal (added 08/2021) >> 6-8 units before all meals >> just in am!? - Basaglar 8 units >> 10 units daily  She was on Onglyza (too expensive).  She  was on JanuMet (stopped being effective), then Januvia >> stopped when starting Trulicity She was on Victoza (trial period). We changed from Victoza to Ozempic 08/2018. She refuses insulin.  Pt checks her sugars 1-2 times a day (CGM was not affordable): - am:  93-190 >> 147-211 >> 89, 107-190, 229, 243 >> 79, 94, 114-240 - 2h after b'fast: 200, 225 >> 215 >> n/c >> 300 >> n/c - before lunch:  121-150, 185 >> 129, 250, 256 >> 170 >> 285 - 2h after lunch: 98, 199, 258 >> 186 >> n/c >> 247 >> 200 - before dinner: n/c >> 110-125 >> 178 >> n/c >> 170 >> n/c - 2h after dinner:184 >> 190 >> n/c >> 132, 274 >> n/c  - bedtime: 154 >> 149-260 >> 133-180 >> 200-300 >> n/c - nighttime: 85-142, 195 >> 73, 75, 214 >> n/c Lowest sugar was 58 ... >> 75 >> 93 >> 129 >> 79; it is unclear at which level she has hypoglycemia awareness Highest sugar was 344 ...  >> 256 >> 300 >> 285  Glucometer: FreeStyle Ultra mini >> ReliOn  Pt's meals are: - Breakfast: fruit, yoghurt, oatmeal, eggs, grits, - Lunch: pasta, sandwich, salad - Dinner: meat + veggies - Snacks: 3  During the coronavirus pandemic, she had to change her job and this was a stressful. She works from home and dinner is usually late.  -+ h/o CKD, last BUN/creatinine:     2021-09-16    Albumin 4.7   3.4-4.8  ALP 50   38-126  ALT 21   0-52  Anion Gap 14.9   6.0-20.0  AST 24  0-39  BUN 22   6-26  CO2 32   22-32  CA-corrected 9.39   8.60-10.30  Calcium 10.1   8.6-10.3  Chloride 99   98-107  Creatinine 1.08   0.60-1.30  eGFR2021 63   >60  Glucose 188   70-99  Potassium 4.9   3.5-5.5  Sodium 141   136-145  Protein, Total 7.1   6.0-8.3  TBIL 0.4   0.3-1.0      2021-09-16    MA/CR ratio 13.7   0.0-30.0  UCR 67      UMA 0.91   0.00-1.90   09/13/2020: Glucose 131, BUN/creatinine 29/1.13, GFR 62 Lab Results  Component Value Date   BUN 22 03/26/2020   CREATININE 1.19 (H) 03/26/2020  03/27/2019: 17/1.01, GFR 72, glucose 128 Lab Results   Component Value Date   GFRAA 63 03/26/2020   GFRAA 82 01/07/2018    No recent microalbuminuria: Lab Results  Component Value Date   MICRALBCREAT 2.5 03/26/2020   MICRALBCREAT 1.0 01/07/2018  03/27/2019: ACR 21.2 ACR  (09/15/2014): 44.4 ACR (01/20/2014): 79.9 On ramipril.  -+ HL; last set of lipids:    2021-09-16    LDL Chol Calc (NIH) 56   0-99  CHOL/HDL 2.3   2.0-4.0  Cholesterol 132   <200  HDLD 59   30-85  LDL Chol Calc (NIH) 56   0-99  NHDL 74   0-129  Triglyceride 96   0-199   09/17/2020: 138/93/56/64 Lab Results  Component Value Date   CHOL 133 03/26/2020   HDL 54.30 03/26/2020   LDLCALC 56 03/26/2020   TRIG 114.0 03/26/2020   CHOLHDL 2 03/26/2020  03/27/2019: 147/138/57/62 03/31/2016: 136/69/49/73 06/12/2014: 147/118/44/80 On Lipitor 40.  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   -No numbness and tingling in her feet.  She has heel spurs.  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  She stopped Digoxin >> on Corlanor now. 2D echo by her cardiologist >> EF improved to 50 to 55%.  ROS: + see HPI  I reviewed pt's medications, allergies, PMH, social hx, family hx, and changes were documented in the history of present illness. Otherwise, unchanged from my initial visit note.  Past Medical History:  Diagnosis Date   Depression    NICM (nonischemic cardiomyopathy) (HCC)    2D ECHO, 12/26/2011 - EF 45-50%, left ventricle borderline dilated   Past Surgical History:  Procedure Laterality Date   CARDIAC CATHETERIZATION  01/06/2002   Dilated nonsichemic cardiopmyopathy, EF 28%, at least 2+ angiographic mitral regurgitation   Social History   Social History   Marital Status: Married    Spouse Name: N/A   Number of Children: 1   Occupational History    claims benefits specialist    Social History Main Topics   Smoking status: Never Smoker    Smokeless  tobacco: Not on file   Alcohol Use: No   Drug Use: No   Current Outpatient Medications on File Prior to Visit  Medication Sig Dispense Refill   allopurinol (ZYLOPRIM) 100 MG tablet Take 1 tablet by mouth daily.     aspirin 325 MG tablet Take 325 mg by mouth daily.     atorvastatin (LIPITOR) 40 MG tablet TAKE 1 TABLET BY MOUTH DAILY AT 6 PM. 90 tablet 3   carvedilol (COREG) 25 MG tablet Take 1 tablet (25 mg total)  by mouth 2 (two) times daily with a meal. 180 tablet 3   CORLANOR 5 MG TABS tablet TAKE 1 TABLET (5 MG TOTAL) BY MOUTH 2 (TWO) TIMES DAILY WITH A MEAL. NEED APPOINTMENT 60 tablet 11   diphenhydrAMINE (BENADRYL) 25 MG tablet Take 25 mg by mouth daily.     FARXIGA 10 MG TABS tablet TAKE 1 TABLET DAILY 30 tablet 11   ferrous sulfate 325 (65 FE) MG EC tablet Take 1 tablet by mouth daily.     glipiZIDE (GLUCOTROL XL) 5 MG 24 hr tablet TAKE 2 TABLETS IN AM AND 1/2 A TABLET BEFORE DINNER 225 tablet 2   hydrochlorothiazide (HYDRODIURIL) 25 MG tablet Take 1 tablet (25 mg total) by mouth daily. 90 tablet 0   hydrocortisone 2.5 % cream as needed.     insulin aspart (FIASP FLEXTOUCH) 100 UNIT/ML FlexTouch Pen Inject 5-8 Units into the skin daily before supper. 15 mL 3   Insulin Glargine (BASAGLAR KWIKPEN) 100 UNIT/ML Inject 12 Units into the skin at bedtime. 15 mL 5   Insulin Pen Needle 32G X 4 MM MISC Use 1x a day 100 each 3   metFORMIN (GLUCOPHAGE) 1000 MG tablet TAKE 1 TABLET BY MOUTH 2 TIMES DAILY WITH A MEAL. 180 tablet 3   ONETOUCH VERIO test strip USE TO CHECK BLOOD SUGAR   TWO TIMES A DAY 100 strip 11   OZEMPIC, 2 MG/DOSE, 8 MG/3ML SOPN INJECT 2 MG INTO THE SKIN ONCE A WEEK. 9 mL 3   ramipril (ALTACE) 5 MG capsule TAKE 2 CAPSULES BY MOUTH IN THE MORNING AND 1 CAPSULE IN THE EVENING. 270 capsule 0   spironolactone (ALDACTONE) 25 MG tablet Take 0.5 tablets (12.5 mg total) by mouth daily. 45 tablet 0   tirzepatide (MOUNJARO) 7.5 MG/0.5ML Pen Inject 7.5 mg into the skin once a week. 2 mL 3    No current facility-administered medications on file prior to visit.   Allergies  Allergen Reactions   Betadine [Povidone Iodine]    Iodine    Peanuts [Peanut Oil]    Shellfish Allergy    Other Hives and Rash    Sunlight and Grass    Family History  Problem Relation Age of Onset   Hypertension Mother    GI Bleed Mother    Pneumonia Maternal Grandmother    Cancer Paternal Grandmother        Breast cancer   Breast cancer Paternal Grandmother    PE: BP 128/74 (BP Location: Left Arm, Patient Position: Sitting, Cuff Size: Normal)   Pulse 71   Ht 5\' 2"  (1.575 m)   Wt 203 lb (92.1 kg)   LMP 10/01/2020   SpO2 99%   BMI 37.13 kg/m   Wt Readings from Last 3 Encounters:  07/06/22 203 lb (92.1 kg)  04/04/22 203 lb 9.6 oz (92.4 kg)  11/14/21 202 lb 3.2 oz (91.7 kg)   Constitutional: overweight, in NAD Eyes: no exophthalmos ENT: no masses palpated in neck, no cervical lymphadenopathy Cardiovascular: RRR, No MRG Respiratory: CTA B Musculoskeletal: no deformities Skin: no rashes Neurological: no tremor with outstretched hands  ASSESSMENT: 1. DM2, insulin-dependent, uncontrolled, with complications - CKD - DR  Cardiologist: Dr Rennis Golden  2. Obesity class 2 BMI Classification: < 18.5 underweight  18.5-24.9 normal weight  25.0-29.9 overweight  30.0-34.9 class I obesity  35.0-39.9 class II obesity  ? 40.0 class III obesity   3. HL  PLAN:  1. Patient with longstanding, uncontrolled, type 2 diabetes on a complex  medication regimen including metformin, sulfonylurea, SGLT2 inhibitor, weekly GLP-1/GIP receptor agonist and basal/bolus insulin regimen, with still poor control.  At last visit, sugars were still very high and I advised her to switch from Trulicity to [facility].  I also advised her to stop glipizide and increase the dose of Fiasp and take it before every meal if the sugars remained elevated after switching to [facility].  However, after switching to [facility], she did  not remember the instructions and she is still on only once a day Fiasp, in the morning, upon waking up, and she is still on the glipizide. -Reviewing her blood sugars, they are improved, but quite fluctuating in the morning and she is not checking consistently later in the day.  At next visit, after the first of the year, I am hoping that we can use a CGM. -For now, we discussed about continuing the same regimen but I did advise her to try to take Fiasp before every meal and to stop glipizide. - I suggested to:  Patient Instructions  Please continue: - Metformin 2000 mg with dinner - Farxiga 10 mg in am - Mounjaro 7.5 mg weekly - Basaglar 10 units at bedtime  Change: -  FiAsp 6-8 units before all 3 meals   Stop Glipizide.  Please return in 3 months with your sugar log.    - we checked her HbA1c: 9.3% (lower) - advised to check sugars at different times of the day - 4x a day, rotating check times - advised for yearly eye exams >> she is UTD - return to clinic in 3 months  2. Obesity class 2 -continue SGLT 2 inhibitor and GLP-1 receptor agonist which should also help with weight loss -Weight was approximately stable at last visit -At today's visit, weight is again stable  3. HL -Reviewed latest lipid panel from 08/2021-all fractions at goal -She continues on Lipitor 40 mg daily without side effect  Carlus Pavlov, MD PhD [facility] Endocrinology

## 2022-07-06 NOTE — Patient Instructions (Addendum)
Please continue: - Metformin 2000 mg with dinner - Farxiga 10 mg in am - Mounjaro 7.5 mg weekly - Basaglar 10 units at bedtime  Change: -  FiAsp 6-8 units before all 3 meals   Stop Glipizide.  Please return in 3 months with your sugar log.

## 2022-07-19 ENCOUNTER — Other Ambulatory Visit: Payer: Self-pay | Admitting: Internal Medicine

## 2022-07-23 ENCOUNTER — Encounter: Payer: Self-pay | Admitting: Internal Medicine

## 2022-07-26 ENCOUNTER — Encounter: Payer: Self-pay | Admitting: Internal Medicine

## 2022-07-27 NOTE — Telephone Encounter (Signed)
Spoke with pt notified that she does not Qualify for [facility] Pt assistance. She states that her medication is not affordable even with the copay card.   Called [id number] [address], [place] [phone] Pharmacy  states that this is because of pt's deductible she will have to pay that until the deductible $15,000 - 6,000. As we know this will be for all medications.  Spoke with pt she states that she cannot afford this medication she is not currently using one of her Dm2 medications so she can take Corlanor.. Can we change this to another medication.

## 2022-07-28 NOTE — Telephone Encounter (Signed)
Called pt she states to her knowledge she has not failed any beta-blockers. She has been taking her current medications only. Is there something else she can try...again?

## 2022-07-28 NOTE — Telephone Encounter (Signed)
Spoke with our [facility] and received form for the Corlanor manufacturer to fill out along with the [facility] printout showing the co-pay with and without the co-pay card and bring these back to [place] to fill out our part and fax to the manufacturer for review.

## 2022-08-19 ENCOUNTER — Other Ambulatory Visit: Payer: Self-pay | Admitting: Internal Medicine

## 2022-09-15 ENCOUNTER — Ambulatory Visit: Payer: No Typology Code available for payment source | Attending: Internal Medicine | Admitting: Internal Medicine

## 2022-09-15 ENCOUNTER — Encounter: Payer: Self-pay | Admitting: Internal Medicine

## 2022-09-15 VITALS — BP 128/70 | HR 76 | Ht 63.0 in | Wt 202.2 lb

## 2022-09-15 DIAGNOSIS — I428 Other cardiomyopathies: Secondary | ICD-10-CM | POA: Diagnosis not present

## 2022-09-15 DIAGNOSIS — I10 Essential (primary) hypertension: Secondary | ICD-10-CM | POA: Diagnosis not present

## 2022-09-15 DIAGNOSIS — I4711 Inappropriate sinus tachycardia, so stated: Secondary | ICD-10-CM | POA: Diagnosis not present

## 2022-09-15 MED ORDER — SPIRONOLACTONE 25 MG PO TABS: 12.5000 mg | ORAL_TABLET | Freq: Every day | ORAL | 3 refills | Status: DC

## 2022-09-15 MED ORDER — RAMIPRIL 5 MG PO CAPS: ORAL_CAPSULE | ORAL | 0 refills | Status: DC

## 2022-09-15 MED ORDER — IVABRADINE HCL 5 MG PO TABS: 5.0000 mg | ORAL_TABLET | Freq: Two times a day (BID) | ORAL | 11 refills | Status: DC

## 2022-09-15 MED ORDER — CARVEDILOL 25 MG PO TABS: 25.0000 mg | ORAL_TABLET | Freq: Two times a day (BID) | ORAL | 3 refills | Status: DC

## 2022-09-15 MED ORDER — ATORVASTATIN CALCIUM 40 MG PO TABS: ORAL_TABLET | ORAL | 3 refills | Status: DC

## 2022-09-15 MED ORDER — HYDROCHLOROTHIAZIDE 25 MG PO TABS: 25.0000 mg | ORAL_TABLET | Freq: Every day | ORAL | 0 refills | Status: DC

## 2022-09-15 NOTE — Patient Instructions (Signed)
Medication Instructions:  No Changes In Medications at this time.  *If you need a refill on your cardiac medications before your next appointment, please call your pharmacy*  Lab Work: None Ordered At This Time.  If you have labs (blood work) drawn today and your tests are completely normal, you will receive your results only by: [place] (if you have MyChart) OR A paper copy in the mail If you have any lab test that is abnormal or we need to change your treatment, we will call you to review the results.  Testing/Procedures: None Ordered At This Time.   Follow-Up: At [facility], you and your health needs are our priority.  As part of our continuing mission to provide you with exceptional heart care, we have created designated Provider Care Teams.  These Care Teams include your primary Cardiologist (physician) and Advanced Practice Providers (APPs -  Physician Assistants and Nurse Practitioners) who all work together to provide you with the care you need, when you need it.  Your next appointment:   1 year(s)  Provider:   Pixie Casino, MD

## 2022-09-15 NOTE — Progress Notes (Signed)
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 In 2011, her EF was 45% to 50% by echocardiogram with medical therapy. Unfortunately, after her initial diagnosis, she developed a pulmonary embolus in 2003 and was on Coumadin for about 2 years, and now is on aspirin 325 mg daily. She also has diabetes, hypertension, and obesity. She recently she tripped in her house and developed a Small stress fracture and sprain of her left ankle which she has recovered from. She denies any shortness of breath with exertion, chest pain, palpitations, presyncope, syncopal symptoms.  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 She continues to feel at baseline. Interestingly, her heart rate is lower than it was at her last office visit.  Laurie Small returns today for followup. She reports doing well denies any shortness of breath or chest pain with exertion. Unfortunately she's not been able to lose any significant amount of weight. She has had some adjustments in her diabetes medicine with the addition of glipizide, she is also taking Onglyza and metformin 1000 mg twice daily.  She is interested in possibly getting off of some of her medications.  Saw Laurie Small back in the office today. Overall she is doing well without any symptoms of heart failure. Blood sugar has been better controlled. Weight appears to be down just a few pounds. EKG looks normal today. Her last echocardiogram however did not show significant improvement in EF, therefore I recommended we remain on her current heart failure medications. Currently she has NYHA class I symptoms.  04/28/2016  Laurie Small returns today for  follow-up. In the past year she's done fairly well. She denies any chest pain or worsening shortness of breath. Unfortunately she's been struggling with left plantar fasciitis and she is in a walking boot today. She's actually lost 46 pounds since we last saw her. She continues to have class I heart failure symptoms. Interestingly she is tachycardic today. Initially this was thought to be due to her rushing in the office however her heart rate remained elevated even at the end of the visit. She reports compliance with her beta blocker which is actually a high dose. I don't know if this is related to worsening heart failure with sympathetic activation. She was supposed to have an echo a year ago but we did not obtain that and I like to reassess LV function since his been 2 years since her last study. Her last EF was 40-45%. Stress new heart failure treatments that are available today including Entresto and Corlanor - which may be of some benefit.  06/21/2016  Laurie Small was seen in follow-up today. A repeat echo still shows her LVEF to be around 40%. She remains persistently tachycardiac with HR in the upper 90's and low 100's. This appears to be sinus. Blood pressure runs in the A999333 systolic range and there is little room to increase her carvedilol which is already at 25 mg BID. She was on digoxin, but it did not seem to affect her heartrate, so I discontinued it. She may be a candidate for Corlanor. A big question is whether she has persistent, inappropriate sinus tachycardia and whether this is contributing to her cardiomyopathy.  07/28/2016  Mrs. Mingo Amber  Volanda Small returns today for follow-up. Her heart rate remains in the upper 90s. Her monitor showed heart rates between the 70s and 130s, however she is persistently in the low 90s and 100s. Blood pressure is a little higher today and she may tolerate an increase in her carvedilol. We discussed adding Corlanor, however she is not interested this time  due to cost issues.  08/29/2016  Laurie Small returns for follow-up. I increased her carvedilol and blood pressure now is more in the A999333 systolic range. Heart rate remains elevated in the 80s and 90s. This is actually on except behind given her low ejection fraction of 40%.  I discussed this with Dr. Haroldine Laws and although EF is not less than 35%, there is probably good reason to consider trying it for HR improvement.  09/28/2016  Laurie Small returns today for follow-up. Interestingly her heart rate has improved in general on Ivabradine. It appears from her home measurements that her heart rate is somewhere around 70. She is on the 5 mg twice a day dosing. The only side effect she notices are the visual disturbances which seems to be worse in the morning but gets better fairly quickly.  03/30/2017  Laurie Small was seen today in follow-up. She seems to noted an improvement in her heart rate on Ivabradine. Heart rate today is 67 and in general she's not had any significant tachycardia or inappropriate increases in heart rate with minimal exertion. In addition she is on carvedilol. She denies any chest pain. She occasionally has some visual disturbances in the morning however it seems to be better as the day goes on and not associated to the dose of medicine.  04/12/2018  Laurie Small returns today for follow-up.  Overall she continues to do really well.  She is tolerated ivabradine with improvement in heart rate.  Her last echo showed her EF up to 50 to 55% as of October 2018.  She is asymptomatic with NYHA class I symptoms.  Blood pressure is well controlled today.  06/02/2020  Laurie Small is seen today in follow-up.  Overall she is doing well.  Her heart function had returned to near normal at 50 to 55% in 2018.  Since then she has had no heart failure symptoms.  Heart rate control has been consistent in the mid 70s on ivabradine and high dose carvedilol.  She is also on  ramipril and Aldactone.  I am hesitant to change her medications since she is done well with it.  08/12/2021  Laurie Small returns today for follow-up.  She seems to be doing well.  Her last echo was in 2018 at which time her EF was low normal which demonstrated marked improvement.  The this is related to the number of medications she is using.  She has been on the Corlanor which has helped with her tachycardia and I think that was an issue possibly related to her nonischemic cardiomyopathy.  She continues to exercise and is asymptomatic.  Blood pressure is well controlled.  Her lipids were at target with total 138, HDL 56, triglycerides 93 and LDL 64.  The only outstanding issue was her hemoglobin A1c at 9.1%.  She is seeing Dr. Cruzita Lederer with endocrinology who is working on this.  09/15/2022  Laurie Small is seen today in follow-up.  Overall she says she is feeling well.  Fortunately her last echo showed normalization of her LVEF.  She has been struggling somewhat with her blood sugars.  A1c over  9%.  She has although lost some weight at least 5 pounds since I last saw her.  The other issue is the cost of Corlanor which is quite expensive for her.  There has not been any assistance option for her that we could find.  PMHx:  Past Medical History:  Diagnosis Date   Depression    NICM (nonischemic cardiomyopathy) ([place])    2D ECHO, 12/26/2011 - EF 45-50%, left ventricle borderline dilated    Past Surgical History:  Procedure Laterality Date   CARDIAC CATHETERIZATION  01/06/2002   Dilated nonsichemic cardiopmyopathy, EF 28%, at least 2+ angiographic mitral regurgitation    FAMHx:  Family History  Problem Relation Age of Onset   Hypertension Mother    GI Bleed Mother    Pneumonia Maternal Grandmother    Cancer Paternal Grandmother        Breast cancer   Breast cancer Paternal Grandmother     SOCHx:   reports that she has never smoked. She has never used smokeless tobacco. She  reports that she does not drink alcohol and does not use drugs.  ALLERGIES:  Allergies  Allergen Reactions   Betadine [Povidone Iodine]    Iodine    Peanuts [Peanut Oil]    Shellfish Allergy    Other Hives and Rash    Sunlight and Grass     ROS: Pertinent items noted in HPI and remainder of comprehensive ROS otherwise negative.  HOME MEDS: Current Outpatient Medications  Medication Sig Dispense Refill   allopurinol (ZYLOPRIM) 100 MG tablet Take 1 tablet by mouth daily.     aspirin 325 MG tablet Take 325 mg by mouth daily.     atorvastatin (LIPITOR) 40 MG tablet TAKE 1 TABLET BY MOUTH DAILY AT 6 PM. 90 tablet 3   carvedilol (COREG) 25 MG tablet Take 1 tablet (25 mg total) by mouth 2 (two) times daily with a meal. 180 tablet 3   CORLANOR 5 MG TABS tablet TAKE 1 TABLET (5 MG TOTAL) BY MOUTH 2 (TWO) TIMES DAILY WITH A MEAL. NEED APPOINTMENT 60 tablet 11   diphenhydrAMINE (BENADRYL) 25 MG tablet Take 25 mg by mouth daily.     FARXIGA 10 MG TABS tablet TAKE 1 TABLET DAILY 30 tablet 11   hydrochlorothiazide (HYDRODIURIL) 25 MG tablet Take 1 tablet (25 mg total) by mouth daily. 90 tablet 0   hydrocortisone 2.5 % cream as needed.     insulin aspart (FIASP FLEXTOUCH) 100 UNIT/ML FlexTouch Pen Inject 5-8 Units into the skin daily before supper. 15 mL 3   insulin glargine (LANTUS SOLOSTAR) 100 UNIT/ML Solostar Pen Inject 10 Units into the skin daily. 15 mL 3   Insulin Pen Needle 32G X 4 MM MISC Use 1x a day 100 each 3   metFORMIN (GLUCOPHAGE) 1000 MG tablet TAKE 1 TABLET BY MOUTH 2 TIMES DAILY WITH A MEAL. 180 tablet 3   OneTouch Delica Lancets 99991111 MISC Use 3x a day 200 each 3   ONETOUCH VERIO test strip USE TO CHECK BLOOD SUGAR   TWO TIMES A DAY 100 strip 11   ramipril (ALTACE) 5 MG capsule TAKE 2 CAPSULES BY MOUTH IN THE MORNING AND 1 CAPSULE IN THE EVENING. 270 capsule 0   spironolactone (ALDACTONE) 25 MG tablet Take 0.5 tablets (12.5 mg total) by mouth daily. 45 tablet 0   tirzepatide  (MOUNJARO) 7.5 MG/0.5ML Pen INJECT 7.5 MG SUBCUTANEOUSLY WEEKLY 2 mL 3   ferrous sulfate 325 (65 FE) MG EC tablet Take  1 tablet by mouth daily. (Patient not taking: Reported on 09/15/2022)     No current facility-administered medications for this visit.    LABS/IMAGING: No results found for this or any previous visit (from the past 48 hour(s)). No results found.  VITALS: BP 128/70   Pulse 76   Ht '5\' 3"'$  (1.6 m)   Wt 202 lb 3.2 oz (91.7 kg)   LMP 10/01/2020   BMI 35.82 kg/m   EXAM: General appearance: alert and no distress Neck: no carotid bruit, no JVD and thyroid not enlarged, symmetric, no tenderness/mass/nodules Lungs: clear to auscultation bilaterally Heart: regular rate and rhythm, S1, S2 normal, no murmur, click, rub or gallop Abdomen: soft, non-tender; bowel sounds normal; no masses,  no organomegaly and obese Extremities: extremities normal, atraumatic, no cyanosis or edema Pulses: 2+ and symmetric Skin: Skin color, texture, turgor normal. No rashes or lesions Neurologic: Grossly normal Psych: Pleasant  EKG: Normal sinus rhythm at 76, LAFB, poor R wave progression anteriorly-personally reviewed  ASSESSMENT: Nonischemic cardiomyopathy, EF 40-45% -> 50-55% (04/2017) - NYHA class I symptoms, LVEF normalized to 55 to 60% (07/2021) Inappropriate sinus tachycardia History of pulmonary embolism Diabetes type 2 Hypertension Obesity  PLAN: 1.    Ms. Steege has had interval normalization of her LVEF as of last year to 55 to 60%.  She has no heart failure symptoms or shortness of breath.  She recently started exercising more on a treadmill that she bought for her house.  She is trying to lower her A1c further.  She has had improvement in her tachycardia with Corlanor and I am really hesitant to stop it although it is quite costly for her.  I do think that this may have played a role in her cardiomyopathy.  Will continue her current medicines.  Follow-up annually or sooner  as necessary.  Pixie Casino, MD, [facility], [place] Director of the [facility]  [facility] Diplomate of the American Board of Clinical Lipidology Attending Cardiologist  Direct Dial: [phone]  Fax: [phone]  Website:  [email or web address] 09/15/2022, 3:36 PM

## 2022-09-26 ENCOUNTER — Other Ambulatory Visit: Payer: Self-pay | Admitting: Internal Medicine

## 2022-10-04 ENCOUNTER — Other Ambulatory Visit: Payer: Self-pay | Admitting: Internal Medicine

## 2022-10-05 ENCOUNTER — Other Ambulatory Visit: Payer: Self-pay | Admitting: Internal Medicine

## 2022-10-05 ENCOUNTER — Encounter: Payer: Self-pay | Admitting: Internal Medicine

## 2022-10-05 ENCOUNTER — Ambulatory Visit: Payer: No Typology Code available for payment source | Admitting: Internal Medicine

## 2022-10-05 VITALS — BP 120/82 | HR 82 | Ht 63.0 in | Wt 204.2 lb

## 2022-10-05 DIAGNOSIS — E669 Obesity, unspecified: Secondary | ICD-10-CM

## 2022-10-05 DIAGNOSIS — R809 Proteinuria, unspecified: Secondary | ICD-10-CM | POA: Diagnosis not present

## 2022-10-05 DIAGNOSIS — E1129 Type 2 diabetes mellitus with other diabetic kidney complication: Secondary | ICD-10-CM

## 2022-10-05 DIAGNOSIS — E785 Hyperlipidemia, unspecified: Secondary | ICD-10-CM | POA: Diagnosis not present

## 2022-10-05 LAB — POCT GLYCOSYLATED HEMOGLOBIN (HGB A1C): Hemoglobin A1C: 10.5 % — AB (ref 4.0–5.6)

## 2022-10-05 MED ORDER — LANTUS SOLOSTAR 100 UNIT/ML ~~LOC~~ SOPN: 20.0000 [IU] | PEN_INJECTOR | Freq: Every day | SUBCUTANEOUS | 3 refills | Status: DC

## 2022-10-05 MED ORDER — FIASP FLEXTOUCH 100 UNIT/ML ~~LOC~~ SOPN: 5.0000 [IU] | PEN_INJECTOR | Freq: Every day | SUBCUTANEOUS | 3 refills | Status: DC

## 2022-10-05 MED ORDER — OZEMPIC (2 MG/DOSE) 8 MG/3ML ~~LOC~~ SOPN: 2.0000 mg | PEN_INJECTOR | SUBCUTANEOUS | 3 refills | Status: DC

## 2022-10-05 MED ORDER — DAPAGLIFLOZIN PROPANEDIOL 10 MG PO TABS: 10.0000 mg | ORAL_TABLET | Freq: Every day | ORAL | 11 refills | Status: DC

## 2022-10-05 NOTE — Patient Instructions (Addendum)
Please continue: - Metformin 2000 mg with dinner - Farxiga 10 mg in am - FiAsp 6-8 units before all 3 meals   Please increase: - Basaglar/Lantus 20 units at bedtime and continue to increase the dose by 4 units every 4 days   Instead of Mounjaro, can try: - Ozempic 2 mg weekly  Please return in 1.5 months with your sugar log.

## 2022-10-05 NOTE — Progress Notes (Signed)
Patient ID: Laurie Small, female   DOB: 1976/06/11, 47 y.o.   MRN: [id number]  HPI: Laurie Small is a 47 y.o.-year-old female, presenting for f/u for DM2, dx in 2003 (after an episode of myocarditis), non-insulin-dependent, uncontrolled, with complications (microalbuminuria, DR). Last visit 3 mo ago.  Interim history: No increased urination, blurry vision, nausea, chest pain.  She ran out of [facility] 2 weeks ago and was not able to refill it due to lack of availability at the pharmacy.  She switched to Trulicity, which she still had at home She looked into CGM's and these are still not covered by her insurance.  Reviewed HbA1c levels: Lab Results  Component Value Date   HGBA1C 9.3 (A) 07/06/2022   HGBA1C 10.2 (A) 04/04/2022   HGBA1C 9.8 (A) 09/16/2021  05/17/2021: HbA1c calculated from fructosamine is 7%, much better than the directly measured HbA1c. 09/13/2020: HbA1c calculated from fructosamine is 5.9%, much better than the directly measured HbA1c, although slightly lower than expected from her log 05/23/2019: HbA1c calculated from fructosamine is 5.77%, in this case, however, discrepant with the sugars at home. 05/20/2018: HbA1c calculated from the fructosamine is 6.27%, still excellent. 01/07/2018: HbA1c calculated from fructosamine is 6.1% (great, slightly higher than before!) 03/22/2017: HbA1c calculated from fructosamine is 5.9%!  12/14/2016: HbA1c calculated from fructosamine is 6.6%. 09/14/2016: HbA1c calculated from fructosamine is MUCH lower, at 6.8%! 05/31/2015: HbA1c 10% 09/15/2014: HbA1c 9.6% 04/23/2014: HbA1c 10.9% 01/20/2014: HbA1c 12.9%  Pt is on a regimen of: - Metformin 1000 mg 2x a day, with meals - Glipizide ER XL 10 mg before breakfast and 2.5 mg before dinner >> 5 mg in am - Farxiga 10 mg in am - Trulicity 3 mg weekly/Ozempic 2 mg weekly (alternates between the 2 ~ on availability at the pharmacy) >> Mounjaro 7.5 mg weekly >> ran out 2 weeks ago >>  Trulicity 3 mg weekly - FiAsp 5-8 units before a larger meal (added 08/2021) >> 6-8 units before all meals >> just in am!? >>  Before all 3 meals - Basaglar 8 units >> 10 units daily  She was on Onglyza (too expensive).  She was on JanuMet (stopped being effective), then Januvia >> stopped when starting Trulicity She was on Victoza (trial period). We changed from [place] to [place] 08/2018. She refuses insulin.  Pt checks her sugars 1-2 times a day (CGM was not affordable): - am: 147-211 >> 89, 107-190, 229, 243 >> 79, 94, 114-240 >> 120-250 - 2h after b'fast: 200, 225 >> 215 >> n/c >> 300 >> n/c - before lunch: 121-150, 185 >> 129, 250, 256 >> 170 >> 285 >> 160, 223 - 2h after lunch: 98, 199, 258 >> 186 >> n/c >> 247 >> 200 >> n/c - before dinner: n/c >> 110-125 >> 178 >> n/c >> 170 >> n/c - 2h after dinner:184 >> 190 >> n/c >> 132, 274 >> n/c  - bedtime: 154 >> 149-260 >> 133-180 >> 200-300 >> n/c - nighttime: 85-142, 195 >> 73, 75, 214 >> n/c Lowest sugar was 58 ... >> 75 >> 93 >> 129 >> 79 >> 120; it is unclear at which level she has hypoglycemia awareness Highest sugar was 344 ...  >> 256 >> 300 >> 285 >> 250.  Glucometer: FreeStyle Ultra mini >> ReliOn  Pt's meals are: - Breakfast: fruit, yoghurt, oatmeal, eggs, grits, - Lunch: pasta, sandwich, salad - Dinner: meat + veggies - Snacks: 3  During the coronavirus pandemic, she had to change her job and  this was a stressful. She works from home and dinner is usually late.  -+ h/o CKD, last BUN/creatinine:     2021-09-16    Albumin 4.7   3.4-4.8  ALP 50   38-126  ALT 21   0-52  Anion Gap 14.9   6.0-20.0  AST 24   0-39  BUN 22   6-26  CO2 32   22-32  CA-corrected 9.39   8.60-10.30  Calcium 10.1   8.6-10.3  Chloride 99   98-107  Creatinine 1.08   0.60-1.30  eGFR2021 63   >60  Glucose 188   70-99  Potassium 4.9   3.5-5.5  Sodium 141   136-145  Protein, Total 7.1   6.0-8.3  TBIL 0.4   0.3-1.0      2021-09-16    MA/CR  ratio 13.7   0.0-30.0  UCR 67      UMA 0.91   0.00-1.90   09/13/2020: Glucose 131, BUN/creatinine 29/1.13, GFR 62 Lab Results  Component Value Date   BUN 22 03/26/2020   CREATININE 1.19 (H) 03/26/2020  03/27/2019: 17/1.01, GFR 72, glucose 128 Lab Results  Component Value Date   GFRAA 63 03/26/2020   GFRAA 82 01/07/2018    No recent microalbuminuria: Lab Results  Component Value Date   MICRALBCREAT 2.5 03/26/2020   MICRALBCREAT 1.0 01/07/2018  03/27/2019: ACR 21.2 ACR  (09/15/2014): 44.4 ACR (01/20/2014): 79.9 On ramipril.  -+ HL; last set of lipids:    2021-09-16    LDL Chol Calc (NIH) 56   0-99  CHOL/HDL 2.3   2.0-4.0  Cholesterol 132   <200  HDLD 59   30-85  LDL Chol Calc (NIH) 56   0-99  NHDL 74   0-129  Triglyceride 96   0-199   09/17/2020: 138/93/56/64 Lab Results  Component Value Date   CHOL 133 03/26/2020   HDL 54.30 03/26/2020   LDLCALC 56 03/26/2020   TRIG 114.0 03/26/2020   CHOLHDL 2 03/26/2020  03/27/2019: 147/138/57/62 03/31/2016: 136/69/49/73 06/12/2014: 147/118/44/80 On Lipitor 40.  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   -No numbness and tingling in her feet.  She has heel spurs.  Latest foot exam: 03/2022.  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  She stopped Digoxin >> on Corlanor now. 2D echo by her cardiologist >> EF improved to 50 to 55%.  ROS: + see HPI  I reviewed pt's medications, allergies, PMH, social hx, family hx, and changes were documented in the history of present illness. Otherwise, unchanged from my initial visit note.  Past Medical History:  Diagnosis Date   Depression    NICM (nonischemic cardiomyopathy) ([place])    2D ECHO, 12/26/2011 - EF 45-50%, left ventricle borderline dilated   Past Surgical History:  Procedure Laterality Date   CARDIAC CATHETERIZATION  01/06/2002   Dilated nonsichemic cardiopmyopathy, EF 28%, at least 2+ angiographic mitral  regurgitation   Social History   Social History   Marital Status: Married    Spouse Name: N/A   Number of Children: 1   Occupational History    claims benefits specialist    Social History Main Topics   Smoking status: Never Smoker    Smokeless tobacco: Not on file   Alcohol Use: No   Drug Use: No   Current Outpatient Medications on File Prior to Visit  Medication Sig Dispense Refill   allopurinol (ZYLOPRIM) 100  MG tablet Take 1 tablet by mouth daily.     aspirin 325 MG tablet Take 325 mg by mouth daily.     atorvastatin (LIPITOR) 40 MG tablet TAKE 1 TABLET BY MOUTH DAILY AT 6 PM. 90 tablet 3   carvedilol (COREG) 25 MG tablet Take 1 tablet (25 mg total) by mouth 2 (two) times daily with a meal. 180 tablet 3   diphenhydrAMINE (BENADRYL) 25 MG tablet Take 25 mg by mouth daily.     FARXIGA 10 MG TABS tablet TAKE 1 TABLET DAILY 30 tablet 11   ferrous sulfate 325 (65 FE) MG EC tablet Take 1 tablet by mouth daily. (Patient not taking: Reported on 09/15/2022)     hydrochlorothiazide (HYDRODIURIL) 25 MG tablet Take 1 tablet (25 mg total) by mouth daily. 90 tablet 0   hydrocortisone 2.5 % cream as needed.     insulin aspart (FIASP FLEXTOUCH) 100 UNIT/ML FlexTouch Pen Inject 5-8 Units into the skin daily before supper. 15 mL 3   insulin glargine (LANTUS SOLOSTAR) 100 UNIT/ML Solostar Pen Inject 10 Units into the skin daily. 15 mL 3   Insulin Pen Needle 32G X 4 MM MISC Use 1x a day 100 each 3   ivabradine (CORLANOR) 5 MG TABS tablet Take 1 tablet (5 mg total) by mouth 2 (two) times daily with a meal. 60 tablet 11   metFORMIN (GLUCOPHAGE) 1000 MG tablet TAKE 1 TABLET BY MOUTH 2 TIMES DAILY WITH A MEAL. 180 tablet 3   OneTouch Delica Lancets 99991111 MISC Use 3x a day 200 each 3   ONETOUCH VERIO test strip USE TO CHECK BLOOD SUGAR   TWO TIMES A DAY 100 strip 11   ramipril (ALTACE) 5 MG capsule TAKE 2 CAPSULES BY MOUTH IN THE MORNING AND 1 CAPSULE IN THE EVENING. 270 capsule 0   spironolactone  (ALDACTONE) 25 MG tablet Take 0.5 tablets (12.5 mg total) by mouth daily. 45 tablet 3   tirzepatide (MOUNJARO) 7.5 MG/0.5ML Pen INJECT 7.5 MG SUBCUTANEOUSLY WEEKLY 2 mL 3   No current facility-administered medications on file prior to visit.   Allergies  Allergen Reactions   Betadine [Povidone Iodine]    Iodine    Peanuts [Peanut Oil]    Shellfish Allergy    Other Hives and Rash    Sunlight and Grass    Family History  Problem Relation Age of Onset   Hypertension Mother    GI Bleed Mother    Pneumonia Maternal Grandmother    Cancer Paternal Grandmother        Breast cancer   Breast cancer Paternal Grandmother    PE: BP 120/82 (BP Location: Left Arm, Patient Position: Sitting, Cuff Size: Normal)   Pulse 82   Ht '5\' 3"'$  (1.6 m)   Wt 204 lb 3.2 oz (92.6 kg)   LMP 10/01/2020   SpO2 98%   BMI 36.17 kg/m   Wt Readings from Last 3 Encounters:  10/05/22 204 lb 3.2 oz (92.6 kg)  09/15/22 202 lb 3.2 oz (91.7 kg)  07/06/22 203 lb (92.1 kg)   Constitutional: overweight, in NAD Eyes: no exophthalmos ENT: no masses palpated in neck, no cervical lymphadenopathy Cardiovascular: RRR, No MRG Respiratory: CTA B Musculoskeletal: no deformities Skin: no rashes Neurological: no tremor with outstretched hands  ASSESSMENT: 1. DM2, insulin-dependent, uncontrolled, with complications - CKD - DR  Cardiologist: Dr Debara Pickett  2. Obesity class 2    3. HL  PLAN:  1. Patient with longstanding, uncontrolled, type 2 diabetes,  on a complex antidiabetic regimen including metformin, SGLT2 inhibitor, GLP-1/GIP receptor agonist, basal insulin, and now bolus insulin regimen before each meal.  At last visit, she was only using Fiasp once a day, in the morning, upon waking up but this was not enough, as HbA1c was slightly lower, but still above target, at 9.3%.  I advised her to stop glipizide at that time.   -At today's visit, sugars remain high when she checks in the morning but also later in the  day.  Most of them are above target.  She is checking very rarely later in the day and we discussed that we need more data to be able to adjust her regimen.  She is taking a very low-dose of Basaglar and I advised her to increase this and continue to increase it until sugars improve in the morning.  She is off Mounjaro and on Trulicity, but we discussed that this is not strong enough and I advised her to switch to Ozempic, which is now again available at [facility].  When [facility] becomes available, I would like her to take a higher dose compared to before, 10 mg weekly.  I advised her to let me know. -For now we will continue metformin and Farxiga. - I suggested to:  Patient Instructions  Please continue: - Metformin 2000 mg with dinner - Farxiga 10 mg in am - FiAsp 6-8 units before all 3 meals   Please increase: - Basaglar/Lantus 20 units at bedtime and continue to increase the dose by 4 units every 4 days   Instead of Mounjaro, can try: - Ozempic 2 mg weekly  Please return in 1.5 months with your sugar log.    - we checked her HbA1c: 10.5% (higher) - advised to check sugars at different times of the day - 3x a day, rotating check times - advised for yearly eye exams >> she is UTD - return to clinic in 3-4 months  2. Obesity class 2 -continue SGLT 2 inhibitor and GLP-1/GIP receptor agonist which should also help with weight loss -At last visit, weight was approximately stable.  It remains stable today  3. HL - Reviewed latest lipid panel from 08/2021-all fractions at goal - Continues Lipitor 40 mg daily without side effects. -She is due for another lipid panel -she would prefer to have this done at next visit  Philemon Kingdom, MD PhD [facility] Endocrinology

## 2022-10-06 ENCOUNTER — Other Ambulatory Visit: Payer: Self-pay | Admitting: Internal Medicine

## 2022-10-06 DIAGNOSIS — E1129 Type 2 diabetes mellitus with other diabetic kidney complication: Secondary | ICD-10-CM

## 2022-10-09 ENCOUNTER — Other Ambulatory Visit: Payer: Self-pay | Admitting: Internal Medicine

## 2022-10-14 ENCOUNTER — Other Ambulatory Visit: Payer: Self-pay | Admitting: Internal Medicine

## 2022-10-14 DIAGNOSIS — E1129 Type 2 diabetes mellitus with other diabetic kidney complication: Secondary | ICD-10-CM

## 2022-10-21 ENCOUNTER — Other Ambulatory Visit: Payer: Self-pay | Admitting: Internal Medicine

## 2022-10-29 ENCOUNTER — Other Ambulatory Visit: Payer: Self-pay | Admitting: Internal Medicine

## 2022-10-29 DIAGNOSIS — E1129 Type 2 diabetes mellitus with other diabetic kidney complication: Secondary | ICD-10-CM

## 2022-10-30 ENCOUNTER — Ambulatory Visit
Admission: RE | Admit: 2022-10-30 | Discharge: 2022-10-30 | Disposition: A | Discharge status: Home / Self Care | Payer: No Typology Code available for payment source | Source: Ambulatory Visit | Attending: Family Medicine

## 2022-10-30 ENCOUNTER — Ambulatory Visit
Admission: RE | Admit: 2022-10-30 | Discharge: 2022-10-30 | Disposition: A | Discharge status: Home / Self Care | Payer: No Typology Code available for payment source | Source: Ambulatory Visit | Attending: Family Medicine | Admitting: Family Medicine

## 2022-10-30 DIAGNOSIS — R928 Other abnormal and inconclusive findings on diagnostic imaging of breast: Secondary | ICD-10-CM

## 2022-10-30 DIAGNOSIS — N6320 Unspecified lump in the left breast, unspecified quadrant: Secondary | ICD-10-CM

## 2022-11-01 ENCOUNTER — Other Ambulatory Visit: Payer: Self-pay | Admitting: Internal Medicine

## 2022-11-01 DIAGNOSIS — R809 Proteinuria, unspecified: Secondary | ICD-10-CM

## 2022-11-14 ENCOUNTER — Other Ambulatory Visit: Payer: Self-pay | Admitting: Internal Medicine

## 2022-11-17 ENCOUNTER — Ambulatory Visit (INDEPENDENT_AMBULATORY_CARE_PROVIDER_SITE_OTHER): Payer: No Typology Code available for payment source | Admitting: Internal Medicine

## 2022-11-17 ENCOUNTER — Encounter: Payer: Self-pay | Admitting: Internal Medicine

## 2022-11-17 VITALS — BP 122/82 | HR 72 | Ht 63.0 in | Wt 203.0 lb

## 2022-11-17 DIAGNOSIS — R809 Proteinuria, unspecified: Secondary | ICD-10-CM | POA: Diagnosis not present

## 2022-11-17 DIAGNOSIS — E669 Obesity, unspecified: Secondary | ICD-10-CM | POA: Diagnosis not present

## 2022-11-17 DIAGNOSIS — E785 Hyperlipidemia, unspecified: Secondary | ICD-10-CM

## 2022-11-17 DIAGNOSIS — Z794 Long term (current) use of insulin: Secondary | ICD-10-CM

## 2022-11-17 DIAGNOSIS — Z7984 Long term (current) use of oral hypoglycemic drugs: Secondary | ICD-10-CM

## 2022-11-17 DIAGNOSIS — Z7985 Long-term (current) use of injectable non-insulin antidiabetic drugs: Secondary | ICD-10-CM

## 2022-11-17 DIAGNOSIS — E1129 Type 2 diabetes mellitus with other diabetic kidney complication: Secondary | ICD-10-CM

## 2022-11-17 NOTE — Patient Instructions (Addendum)
Please continue: - Metformin 2000 mg with dinner - Farxiga 10 mg in am - Mounjaro 7.5 mg weekly  Change: - FiAsp 6-12 units before all 3 meals  - Lantus 20 units at bedtime (if sugars remain high, may need to increase to 25 units)  Stop Glipizide.  Please return in 2 months with your sugar log.

## 2022-11-17 NOTE — Progress Notes (Signed)
Patient ID: Laurie Small, female   DOB: January 06, 1976, 47 y.o.   MRN: [id number]  HPI: Laurie Small is a 47 y.o.-year-old female, presenting for f/u for DM2, dx in 2003 (after an episode of myocarditis), non-insulin-dependent, uncontrolled, with complications (microalbuminuria, DR). Last visit 1.5 mo ago.  Interim history: No increased urination, blurry vision, nausea, chest pain.   She recently had an abnormal mammogram but an ultrasound did not show suspicion lesions. She was finally able to restart [facility]  - had 2 doses.  Previously on Ozempic.  Reviewed HbA1c levels: Lab Results  Component Value Date   HGBA1C 10.5 (A) 10/05/2022   HGBA1C 9.3 (A) 07/06/2022   HGBA1C 10.2 (A) 04/04/2022  05/17/2021: HbA1c calculated from fructosamine is 7%, much better than the directly measured HbA1c. 09/13/2020: HbA1c calculated from fructosamine is 5.9%, much better than the directly measured HbA1c, although slightly lower than expected from her log 05/23/2019: HbA1c calculated from fructosamine is 5.77%, in this case, however, discrepant with the sugars at home. 05/20/2018: HbA1c calculated from the fructosamine is 6.27%, still excellent. 01/07/2018: HbA1c calculated from fructosamine is 6.1% (great, slightly higher than before!) 03/22/2017: HbA1c calculated from fructosamine is 5.9%!  12/14/2016: HbA1c calculated from fructosamine is 6.6%. 09/14/2016: HbA1c calculated from fructosamine is MUCH lower, at 6.8%! 05/31/2015: HbA1c 10% 09/15/2014: HbA1c 9.6% 04/23/2014: HbA1c 10.9% 01/20/2014: HbA1c 12.9%  Pt is on a regimen of: - Metformin 1000 mg 2x a day, with meals - Glipizide ER XL 10 mg before breakfast and 2.5 mg before dinner >> 5 mg in am - Farxiga 10 mg in am - Trulicity 3 mg weekly/Ozempic 2 mg weekly >> Mounjaro 7.5 mg weekly >> ran out  >> Trulicity 3 mg weekly >> Ozempic 2 mg weekly >> Mounjaro 7.5 mg weekly - had 2 doses - FiAsp 5-8 units before a larger meal (added 08/2021)  >> 6-8 units before all meals >> just in am!? >>  Before all 3 meals - Basaglar 8 units >> 10 >> Lantus 20 units daily  She was on Onglyza (too expensive).  She was on JanuMet (stopped being effective), then Januvia >> stopped when starting Trulicity She was on Victoza (trial period). We changed from Victoza to Ozempic 08/2018. She refuses insulin.  Pt checks her sugars 1-2 times a day (CGM was not affordable): - am: 147-211 >> 89, 107-190, 229, 243 >> 79, 94, 114-240 >> 123-190, 200 - 2h after b'fast: 200, 225 >> 215 >> n/c >> 300 >> n/c - before lunch:  129, 250, 256 >> 170 >> 285 >> 160, 223 >> 180-200, 254, 313, 363 - 2h after lunch: 98, 199, 258 >> 186 >> n/c >> 247 >> 200 >> n/c - before dinner: n/c >> 110-125 >> 178 >> n/c >> 170 >> n/c - 2h after dinner:184 >> 190 >> n/c >> 132, 274 >> n/c  - bedtime: 154 >> 149-260 >> 133-180 >> 200-300 >> n/c - nighttime: 85-142, 195 >> 73, 75, 214 >> n/c Lowest sugar was 58 ... >> 75 >> 93 >> 129 >> 79 >> 120 >> 123; it is unclear at which level she has hypoglycemia awareness Highest sugar was 344 ...  >> 256 >> 300 >> 285 >> 250 >> 363.  Glucometer: FreeStyle Ultra mini >> ReliOn  Pt's meals are: - Breakfast: fruit, yoghurt, oatmeal, eggs, grits, - Lunch: pasta, sandwich, salad - Dinner: meat + veggies - Snacks: 3  During the coronavirus pandemic, she had to change her job and this was a  stressful. She works from home and dinner is usually late.  -+ h/o CKD, last BUN/creatinine:     2021-09-16    Albumin 4.7   3.4-4.8  ALP 50   38-126  ALT 21   0-52  Anion Gap 14.9   6.0-20.0  AST 24   0-39  BUN 22   6-26  CO2 32   22-32  CA-corrected 9.39   8.60-10.30  Calcium 10.1   8.6-10.3  Chloride 99   98-107  Creatinine 1.08   0.60-1.30  eGFR2021 63   >60  Glucose 188   70-99  Potassium 4.9   3.5-5.5  Sodium 141   136-145  Protein, Total 7.1   6.0-8.3  TBIL 0.4   0.3-1.0      2021-09-16    MA/CR ratio 13.7   0.0-30.0  UCR 67       UMA 0.91   0.00-1.90   09/13/2020: Glucose 131, BUN/creatinine 29/1.13, GFR 62 Lab Results  Component Value Date   BUN 22 03/26/2020   CREATININE 1.19 (H) 03/26/2020  03/27/2019: 17/1.01, GFR 72, glucose 128 Lab Results  Component Value Date   GFRAA 63 03/26/2020   GFRAA 82 01/07/2018    No recent microalbuminuria: Lab Results  Component Value Date   MICRALBCREAT 2.5 03/26/2020   MICRALBCREAT 1.0 01/07/2018  03/27/2019: ACR 21.2 ACR  (09/15/2014): 44.4 ACR (01/20/2014): 79.9 On ramipril.  -+ HL; last set of lipids:    2021-09-16    LDL Chol Calc (NIH) 56   0-99  CHOL/HDL 2.3   2.0-4.0  Cholesterol 132   <200  HDLD 59   30-85  LDL Chol Calc (NIH) 56   0-99  NHDL 74   0-129  Triglyceride 96   0-199   09/17/2020: 138/93/56/64 Lab Results  Component Value Date   CHOL 133 03/26/2020   HDL 54.30 03/26/2020   LDLCALC 56 03/26/2020   TRIG 114.0 03/26/2020   CHOLHDL 2 03/26/2020  03/27/2019: 147/138/57/62 03/31/2016: 136/69/49/73 06/12/2014: 147/118/44/80 On Lipitor 40.  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   -No numbness and tingling in her feet.  She has heel spurs.  Latest foot exam: 03/2022.  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  She stopped Digoxin >> on Corlanor now. 2D echo by her cardiologist >> EF improved to 50 to 55%.  ROS: + see HPI  I reviewed pt's medications, allergies, PMH, social hx, family hx, and changes were documented in the history of present illness. Otherwise, unchanged from my initial visit note.  Past Medical History:  Diagnosis Date   Depression    NICM (nonischemic cardiomyopathy) (HCC)    2D ECHO, 12/26/2011 - EF 45-50%, left ventricle borderline dilated   Past Surgical History:  Procedure Laterality Date   CARDIAC CATHETERIZATION  01/06/2002   Dilated nonsichemic cardiopmyopathy, EF 28%, at least 2+ angiographic mitral regurgitation   Social History    Social History   Marital Status: Married    Spouse Name: N/A   Number of Children: 1   Occupational History    claims benefits specialist    Social History Main Topics   Smoking status: Never Smoker    Smokeless tobacco: Not on file   Alcohol Use: No   Drug Use: No   Current Outpatient Medications on File Prior to Visit  Medication Sig Dispense Refill   allopurinol (ZYLOPRIM) 100 MG tablet Take  1 tablet by mouth daily.     aspirin 325 MG tablet Take 325 mg by mouth daily.     atorvastatin (LIPITOR) 40 MG tablet TAKE 1 TABLET BY MOUTH DAILY AT 6 PM. 90 tablet 3   BD PEN NEEDLE NANO 2ND GEN 32G X 4 MM MISC USE TO TEST ONCE A DAY 100 each 3   carvedilol (COREG) 25 MG tablet Take 1 tablet (25 mg total) by mouth 2 (two) times daily with a meal. 180 tablet 3   dapagliflozin propanediol (FARXIGA) 10 MG TABS tablet TAKE 1 TABLET DAILY 90 tablet 3   diphenhydrAMINE (BENADRYL) 25 MG tablet Take 25 mg by mouth daily.     ferrous sulfate 325 (65 FE) MG EC tablet Take 1 tablet by mouth daily. (Patient not taking: Reported on 09/15/2022)     hydrochlorothiazide (HYDRODIURIL) 25 MG tablet TAKE 1 TABLET DAILY 90 tablet 3   hydrocortisone 2.5 % cream as needed.     insulin aspart (FIASP FLEXTOUCH) 100 UNIT/ML FlexTouch Pen INJECT 5-8 UNITS INTO THE SKIN DAILY BEFORE SUPPER. 15 mL 3   insulin degludec (TRESIBA FLEXTOUCH) 100 UNIT/ML FlexTouch Pen Inject 20 Units into the skin daily. 15 mL 3   ivabradine (CORLANOR) 5 MG TABS tablet Take 1 tablet (5 mg total) by mouth 2 (two) times daily with a meal. 60 tablet 11   metFORMIN (GLUCOPHAGE) 1000 MG tablet TAKE 1 TABLET BY MOUTH 2 TIMES DAILY WITH A MEAL. 180 tablet 1   OneTouch Delica Lancets 33G MISC Use 3x a day 200 each 3   ONETOUCH VERIO test strip USE TO CHECK BLOOD SUGAR   TWO TIMES A DAY 100 strip 11   ramipril (ALTACE) 5 MG capsule TAKE 2 CAPSULES BY MOUTH IN THE MORNING AND 1 CAPSULE IN THE EVENING. 270 capsule 3   Semaglutide, 2 MG/DOSE,  (OZEMPIC, 2 MG/DOSE,) 8 MG/3ML SOPN Inject 2 mg into the skin once a week. 9 mL 3   spironolactone (ALDACTONE) 25 MG tablet Take 0.5 tablets (12.5 mg total) by mouth daily. 45 tablet 3   tirzepatide (MOUNJARO) 7.5 MG/0.5ML Pen INJECT 7.5 MG SUBCUTANEOUSLY WEEKLY 2 mL 3   No current facility-administered medications on file prior to visit.   Allergies  Allergen Reactions   Betadine [Povidone Iodine]    Iodine    Peanuts [Peanut Oil]    Shellfish Allergy    Other Hives and Rash    Sunlight and Grass    Family History  Problem Relation Age of Onset   Hypertension Mother    GI Bleed Mother    Pneumonia Maternal Grandmother    Cancer Paternal Grandmother        Breast cancer   Breast cancer Paternal Grandmother    PE: BP 122/82 (BP Location: Right Arm, Patient Position: Sitting, Cuff Size: Normal)   Pulse 72   Ht 5\' 3"  (1.6 m)   Wt 203 lb (92.1 kg)   LMP 10/01/2020   SpO2 99%   BMI 35.96 kg/m   Wt Readings from Last 3 Encounters:  11/17/22 203 lb (92.1 kg)  10/05/22 204 lb 3.2 oz (92.6 kg)  09/15/22 202 lb 3.2 oz (91.7 kg)   Constitutional: overweight, in NAD Eyes: no exophthalmos ENT: no masses palpated in neck, no cervical lymphadenopathy Cardiovascular: RRR, No MRG Respiratory: CTA B Musculoskeletal: no deformities Skin: no rashes Neurological: no tremor with outstretched hands  ASSESSMENT: 1. DM2, insulin-dependent, uncontrolled, with complications - CKD - DR  Cardiologist: Dr Rennis Golden  2. Obesity class 2  3. HL  PLAN:  1. Patient with longstanding, uncontrolled, type 2 diabetes, on a complex antidiabetic regimen with metformin, SGLT2 inhibitor, GLP-1 receptor agonist and basal/bolus insulin with still poor control.  At last visit, HbA1c was higher, at 10.5%.  At that time she was off [facility] as she was not able to obtain it due to lack of availability at the pharmacy.  She continued on Trulicity but this was not strong enough for her so we did discuss about  trying again to obtain [facility], at the higher dose, 10 mg weekly, but if not, to get Ozempic 2 mg weekly.  We also increased the dose of her insulin. -At today's visit, she tells me that she was able to use Ozempic but she finally obtained Mounjaro pump which she just had 2 doses.  She takes the 7.5 mg dose.  Sugars are slightly better after starting this, we discussed that it would need to build in her system before seeing a more pronounced effect. -Sugars do appear to be improved at this visit with occasional higher values in the 300s after breakfast, approximately a month ago, now improving.  Upon questioning, she is still taking glipizide.  Will stop this and increase the dose of Fiasp before the meals.  I advised her that if the sugars are not better afterwards, and they remain high in the morning, she may increase the Lantus dose.  She continues to make positive changes in her diet-trying to avoid sweets. -We discussed again about the CGM at this visit.  This is not covered by her insurance-she just checked their formulary.  However, there is a change in her insurance coming up in 2 months so we may be able to get her on the CGM at that time. - I suggested to:  Patient Instructions  Please continue: - Metformin 2000 mg with dinner - Farxiga 10 mg in am - Mounjaro 7.5 mg weekly  Change: - FiAsp 6-12 units before all 3 meals  - Lantus 20 units at bedtime (if sugars remain high, may need to increase to 25 units)  Stop Glipizide.  Please return in 2 months with your sugar log.    - advised to check sugars at different times of the day - 4x a day, rotating check times - advised for yearly eye exams >> she is UTD - return to clinic in 2 months  2. Obesity class 2 -continue SGLT 2 inhibitor and GLP-1 receptor agonist which should also help with weight loss -Weight was stable at the last 2 visits and again stable now  3. HL - Reviewed latest lipid panel from 08/2021: Fractions at goal -  Continues Lipitor 40 mg daily without side effects - she is due for another lipid panel -has an appointment coming up with PCP in 2 months.  Carlus Pavlov, MD PhD [facility] Endocrinology

## 2022-11-19 ENCOUNTER — Other Ambulatory Visit: Payer: Self-pay | Admitting: Internal Medicine

## 2022-12-20 ENCOUNTER — Other Ambulatory Visit: Payer: Self-pay | Admitting: Internal Medicine

## 2022-12-25 ENCOUNTER — Other Ambulatory Visit: Payer: Self-pay | Admitting: Internal Medicine

## 2022-12-25 DIAGNOSIS — E1129 Type 2 diabetes mellitus with other diabetic kidney complication: Secondary | ICD-10-CM

## 2023-01-01 ENCOUNTER — Other Ambulatory Visit: Payer: Self-pay | Admitting: Internal Medicine

## 2023-01-08 ENCOUNTER — Encounter: Payer: Self-pay | Admitting: Internal Medicine

## 2023-01-09 ENCOUNTER — Other Ambulatory Visit: Payer: Self-pay

## 2023-01-09 MED ORDER — BD PEN NEEDLE NANO 2ND GEN 32G X 4 MM MISC: 3 refills | Status: DC

## 2023-01-26 ENCOUNTER — Encounter: Payer: Self-pay | Admitting: Internal Medicine

## 2023-01-26 DIAGNOSIS — E1129 Type 2 diabetes mellitus with other diabetic kidney complication: Secondary | ICD-10-CM

## 2023-01-26 MED ORDER — MOUNJARO 7.5 MG/0.5ML ~~LOC~~ SOAJ
SUBCUTANEOUS | 3 refills | Status: DC
Start: 2023-01-26 — End: 2023-12-06

## 2023-01-29 ENCOUNTER — Other Ambulatory Visit: Payer: Self-pay | Admitting: Internal Medicine

## 2023-01-29 DIAGNOSIS — R809 Proteinuria, unspecified: Secondary | ICD-10-CM

## 2023-03-16 ENCOUNTER — Encounter: Payer: Self-pay | Admitting: Internal Medicine

## 2023-03-16 ENCOUNTER — Ambulatory Visit (INDEPENDENT_AMBULATORY_CARE_PROVIDER_SITE_OTHER): Payer: No Typology Code available for payment source | Admitting: Internal Medicine

## 2023-03-16 VITALS — BP 128/80 | HR 79 | Ht 63.0 in | Wt 205.6 lb

## 2023-03-16 DIAGNOSIS — Z7985 Long-term (current) use of injectable non-insulin antidiabetic drugs: Secondary | ICD-10-CM

## 2023-03-16 DIAGNOSIS — Z794 Long term (current) use of insulin: Secondary | ICD-10-CM

## 2023-03-16 DIAGNOSIS — E1129 Type 2 diabetes mellitus with other diabetic kidney complication: Secondary | ICD-10-CM

## 2023-03-16 DIAGNOSIS — R809 Proteinuria, unspecified: Secondary | ICD-10-CM | POA: Diagnosis not present

## 2023-03-16 DIAGNOSIS — E669 Obesity, unspecified: Secondary | ICD-10-CM | POA: Diagnosis not present

## 2023-03-16 DIAGNOSIS — Z7984 Long term (current) use of oral hypoglycemic drugs: Secondary | ICD-10-CM

## 2023-03-16 DIAGNOSIS — E119 Type 2 diabetes mellitus without complications: Secondary | ICD-10-CM

## 2023-03-16 DIAGNOSIS — E785 Hyperlipidemia, unspecified: Secondary | ICD-10-CM | POA: Diagnosis not present

## 2023-03-16 LAB — POCT GLYCOSYLATED HEMOGLOBIN (HGB A1C): Hemoglobin A1C: 10.5 % — AB (ref 4.0–5.6)

## 2023-03-16 MED ORDER — FIASP FLEXTOUCH 100 UNIT/ML ~~LOC~~ SOPN: 6.0000 [IU] | PEN_INJECTOR | Freq: Every day | SUBCUTANEOUS | Status: DC

## 2023-03-16 MED ORDER — FREESTYLE LIBRE 3 SENSOR MISC: 1.0000 | 3 refills | Status: DC

## 2023-03-16 MED ORDER — DEXCOM G7 SENSOR MISC
3.0000 | 4 refills | Status: DC
Start: 2023-03-16 — End: 2023-06-18

## 2023-03-16 MED ORDER — LYUMJEV KWIKPEN 100 UNIT/ML ~~LOC~~ SOPN: 6.0000 [IU] | PEN_INJECTOR | Freq: Three times a day (TID) | SUBCUTANEOUS | 3 refills | Status: DC

## 2023-03-16 NOTE — Addendum Note (Signed)
Addended by: Pollie Meyer on: 03/16/2023 04:21 PM   Modules accepted: Orders

## 2023-03-16 NOTE — Progress Notes (Signed)
Patient ID: Laurie Small, female   DOB: 1976-05-20, 47 y.o.   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 Last visit 4 mo ago.  Interim history: No increased urination, blurry vision, nausea, chest pain.    Reviewed HbA1c levels: Lab Results  Component Value Date   HGBA1C 10.5 (A) 10/05/2022   HGBA1C 9.3 (A) 07/06/2022   HGBA1C 10.2 (A) 04/04/2022  05/17/2021: HbA1c calculated from fructosamine is 7%, much better than the directly measured HbA1c. 09/13/2020: HbA1c calculated from fructosamine is 5.9%, much better than the directly measured HbA1c, although slightly lower than expected from her log 05/23/2019: HbA1c calculated from fructosamine is 5.77%, in this case, however, discrepant with the sugars at home. 05/20/2018: HbA1c calculated from the fructosamine is 6.27%, still excellent. 01/07/2018: HbA1c calculated from fructosamine is 6.1% (great, slightly higher than before!) 03/22/2017: HbA1c calculated from fructosamine is 5.9%!  12/14/2016: HbA1c calculated from fructosamine is 6.6%. 09/14/2016: HbA1c calculated from fructosamine is MUCH lower, at 6.8%! 05/31/2015: HbA1c 10% 09/15/2014: HbA1c 9.6% 04/23/2014: HbA1c 10.9% 01/20/2014: HbA1c 12.9%  Pt is on a regimen of: - Metformin 1000 mg 2x a day, with meals - Farxiga 10 mg in am - Trulicity 3 mg weekly/Ozempic 2 mg weekly >> Mounjaro 7.5 mg weekly >> ran out  >> Trulicity 3 mg weekly >> Ozempic 2 mg weekly >> Mounjaro 7.5 mg weekly  - FiAsp 5-8 units before a larger meal (added 08/2021) >> 6-8 units before all meals >> just in am!? >>  6 to 12 units before all 3 meals - Basaglar 8 units >> 10 >> Lantus 20 units daily  She was on Onglyza (too expensive).  She was on JanuMet (stopped being effective), then Januvia >> stopped when starting  Trulicity She was on Victoza (trial period). We changed from Victoza to Ozempic 08/2018. We stopped glipizide in 10/2022.  Pt checks her sugars sporadically (CGM was not affordable): - am: 89, 107-190, 229, 243 >> 79, 94, 114-240 >> 123-190, 200 >> 114, 132-228, 276 - 2h after b'fast: 200, 225 >> 215 >> n/c >> 300 >> n/c - before lunch: 170 >> 285 >> 160, 223 >> 180-200, 254, 313, 363 >> n/c - 2h after lunch: 98, 199, 258 >> 186 >> n/c >> 247 >> 200 >> n/c - before dinner: n/c >> 110-125 >> 178 >> n/c >> 170 >> n/c - 2h after dinner:184 >> 190 >> n/c >> 132, 274 >> n/c  - bedtime: 154 >> 149-260 >> 133-180 >> 200-300 >> n/c >> 118, 148 - nighttime: 85-142, 195 >> 73, 75, 214 >> n/c Lowest sugar was 79 >> 120 >> 123 >> 114; it is unclear at which level she has hypoglycemia awareness Highest sugar was 285 >> 250 >> 363 >> 276.  Glucometer: FreeStyle Ultra mini >> ReliOn  Pt's meals are: - Breakfast: fruit, yoghurt, oatmeal, eggs, grits, - Lunch: pasta, sandwich, salad - Dinner: meat + veggies - Snacks: 3  During the coronavirus pandemic, she had to change her job and this was a stressful. She works from home and dinner is usually late.  -+ h/o CKD, last BUN/creatinine:  Comp Metabolic Panel Reviewed date:01/29/2023 08:41:36 AM Interpretation:GFR 58 Performing Lab: Notes/Report: Testing performed at: [BN] [facility], [address] [place], [place], [address], Phone: [phone], Laboratory Director: Jolene Schimke, MD  Glucose 208 70-99 mg/dL    BUN 22 8-65  mg/dL    Creatinine 9.14 7.82-9.56 mg/dL    eGFR 58 >21 HY/QMV/7.84    Sodium 142 134-144 mmol/L    Potassium 4.9 3.5-5.2 mmol/L    Chloride 99 96-106 mmol/L    Carbon Dioxide, Total 25 20-29 mmol/L    Calcium 10.0 8.7-10.2 mg/dL    CA-corrected 6.96 2.95-28.41 mg/dL    Protein, Total 7.4 3.2-4.4 g/dL    Albumin 4.9 0.1-0.2 g/dL    Bilirubin, Total <7.2 0.0-1.2 mg/dL    Alkaline Phosphatase 71 44-121 IU/L     AST (SGOT) 33 0-40 IU/L    ALT (SGPT) 27 0-32 IU/L    BUN/Creatinine Ratio 19 9-23    Globulin, Total 2.5 1.5-4.5 g/dL       5366-44-03    Albumin 4.7   3.4-4.8  ALP 50   38-126  ALT 21   0-52  Anion Gap 14.9   6.0-20.0  AST 24   0-39  BUN 22   6-26  CO2 32   22-32  CA-corrected 9.39   8.60-10.30  Calcium 10.1   8.6-10.3  Chloride 99   98-107  Creatinine 1.08   0.60-1.30  eGFR2021 63   >60  Glucose 188   70-99  Potassium 4.9   3.5-5.5  Sodium 141   136-145  Protein, Total 7.1   6.0-8.3  TBIL 0.4   0.3-1.0      2021-09-16    MA/CR ratio 13.7   0.0-30.0  UCR 67      UMA 0.91   0.00-1.90   09/13/2020: Glucose 131, BUN/creatinine 29/1.13, GFR 62 Lab Results  Component Value Date   BUN 22 03/26/2020   CREATININE 1.19 (H) 03/26/2020  03/27/2019: 17/1.01, GFR 72, glucose 128 Lab Results  Component Value Date   GFRAA 63 03/26/2020   GFRAA 82 01/07/2018    No recent microalbuminuria: Lab Results  Component Value Date   MICRALBCREAT 2.5 03/26/2020   MICRALBCREAT 1.0 01/07/2018  03/27/2019: ACR 21.2 ACR  (09/15/2014): 44.4 ACR (01/20/2014): 79.9 On ramipril.  -+ HL; last set of lipids:    2021-09-16    LDL Chol Calc (NIH) 56   0-99  CHOL/HDL 2.3   2.0-4.0  Cholesterol 132   <200  HDLD 59   30-85  LDL Chol Calc (NIH) 56   0-99  NHDL 74   0-129  Triglyceride 96   0-199   09/17/2020: 138/93/56/64 Lab Results  Component Value Date   CHOL 133 03/26/2020   HDL 54.30 03/26/2020   LDLCALC 56 03/26/2020   TRIG 114.0 03/26/2020   CHOLHDL 2 03/26/2020  03/27/2019: 147/138/57/62 03/31/2016: 136/69/49/73 06/12/2014: 147/118/44/80 On Lipitor 40.  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   -No numbness and tingling in her feet.  She has heel spurs.  Latest foot exam: 04/04/2022.  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  She stopped Digoxin >> on Corlanor now. 2D echo by her cardiologist >> EF  improved to 50 to 55%.  ROS: + see HPI  I reviewed pt's medications, allergies, PMH, social hx, family hx, and changes were documented in the history of present illness. Otherwise, unchanged from my initial visit note.  Past Medical History:  Diagnosis Date   Depression    NICM (nonischemic cardiomyopathy) (HCC)    2D ECHO, 12/26/2011 - EF 45-50%, left ventricle borderline dilated   Past Surgical History:  Procedure Laterality Date  CARDIAC CATHETERIZATION  01/06/2002   Dilated nonsichemic cardiopmyopathy, EF 28%, at least 2+ angiographic mitral regurgitation   Social History   Social History   Marital Status: Married    Spouse Name: N/A   Number of Children: 1   Occupational History    claims benefits specialist    Social History Main Topics   Smoking status: Never Smoker    Smokeless tobacco: Not on file   Alcohol Use: No   Drug Use: No   Current Outpatient Medications on File Prior to Visit  Medication Sig Dispense Refill   allopurinol (ZYLOPRIM) 100 MG tablet Take 1 tablet by mouth daily.     aspirin 325 MG tablet Take 325 mg by mouth daily.     atorvastatin (LIPITOR) 40 MG tablet TAKE 1 TABLET BY MOUTH DAILY AT 6 PM. 90 tablet 3   carvedilol (COREG) 25 MG tablet TAKE 1 TABLET TWICE A DAY  WITH MEALS 180 tablet 3   dapagliflozin propanediol (FARXIGA) 10 MG TABS tablet TAKE 1 TABLET DAILY 90 tablet 3   diphenhydrAMINE (BENADRYL) 25 MG tablet Take 25 mg by mouth daily.     ferrous sulfate 325 (65 FE) MG EC tablet Take 1 tablet by mouth daily. (Patient not taking: Reported on 09/15/2022)     hydrochlorothiazide (HYDRODIURIL) 25 MG tablet TAKE 1 TABLET DAILY 90 tablet 3   hydrocortisone 2.5 % cream as needed.     insulin aspart (FIASP FLEXTOUCH) 100 UNIT/ML FlexTouch Pen INJECT 5-8 UNITS INTO THE SKIN DAILY BEFORE SUPPER. 15 mL 3   insulin glargine (LANTUS SOLOSTAR) 100 UNIT/ML Solostar Pen Inject 20 Units into the skin daily.     Insulin Pen Needle (BD PEN NEEDLE NANO 2ND  GEN) 32G X 4 MM MISC USE TO TEST ONCE A DAY 100 each 3   ivabradine (CORLANOR) 5 MG TABS tablet Take 1 tablet (5 mg total) by mouth 2 (two) times daily with a meal. 60 tablet 11   metFORMIN (GLUCOPHAGE) 1000 MG tablet TAKE 1 TABLET BY MOUTH 2 TIMES DAILY WITH A MEAL. 180 tablet 1   OneTouch Delica Lancets 33G MISC Use 3x a day 200 each 3   ONETOUCH VERIO test strip USE TO CHECK BLOOD SUGAR   TWO TIMES A DAY 100 strip 7   ramipril (ALTACE) 5 MG capsule TAKE 2 CAPSULES BY MOUTH IN THE MORNING AND 1 CAPSULE IN THE EVENING. 270 capsule 3   Semaglutide, 2 MG/DOSE, (OZEMPIC, 2 MG/DOSE,) 8 MG/3ML SOPN Inject 2 mg into the skin once a week. (Patient not taking: Reported on 11/17/2022) 9 mL 3   spironolactone (ALDACTONE) 25 MG tablet Take 0.5 tablets (12.5 mg total) by mouth daily. 45 tablet 3   tirzepatide (MOUNJARO) 7.5 MG/0.5ML Pen INJECT 7.5 MG SUBCUTANEOUSLY WEEKLY 2 mL 3   No current facility-administered medications on file prior to visit.   Allergies  Allergen Reactions   Betadine [Povidone Iodine]    Iodine    Peanuts [Peanut Oil]    Shellfish Allergy    Other Hives and Rash    Sunlight and Grass    Family History  Problem Relation Age of Onset   Hypertension Mother    GI Bleed Mother    Pneumonia Maternal Grandmother    Cancer Paternal Grandmother        Breast cancer   Breast cancer Paternal Grandmother    PE: BP 128/80   Pulse 79   Ht 5\' 3"  (1.6 m)   Wt 205 lb 9.6  oz (93.3 kg)   LMP 10/01/2020   SpO2 99%   BMI 36.42 kg/m   Wt Readings from Last 3 Encounters:  03/16/23 205 lb 9.6 oz (93.3 kg)  11/17/22 203 lb (92.1 kg)  10/05/22 204 lb 3.2 oz (92.6 kg)   Constitutional: overweight, in NAD Eyes: no exophthalmos ENT: no masses palpated in neck, no cervical lymphadenopathy Cardiovascular: RRR, No MRG Respiratory: CTA B Musculoskeletal: no deformities Skin: no rashes Neurological: no tremor with outstretched hands Diabetic Foot Exam - Simple   Simple Foot  Form Diabetic Foot exam was performed with the following findings: Yes 03/16/2023  3:52 PM  Visual Inspection No deformities, no ulcerations, no other skin breakdown bilaterally: Yes Sensation Testing Intact to touch and monofilament testing bilaterally: Yes Pulse Check Posterior Tibialis and Dorsalis pulse intact bilaterally: Yes Comments    ASSESSMENT: 1. DM2, insulin-dependent, uncontrolled, with complications - CKD - DR  Cardiologist: Dr Rennis Golden  2. Obesity class 2  3. HL  PLAN:  1. Patient with longstanding, uncontrolled, type 2 diabetes, on a complex antidiabetic regimen with metformin, SGLT2 inhibitor, basal-bolus insulin and weekly GLP-1/GIP receptor agonist, with still poor control.  Before  last visit, HbA1c was 10.5%, higher.  She was off [facility] for a long period of time and she was finally able to start [facility] 2 weeks prior to the appointment.  Sugars were still occasionally high in the 300s after breakfast, but improving.  She was still taking glipizide and I advised her to stop this and increase the dose of Fiasp.  Unfortunately, CGM was not covered for her. -At today's visit, she is checking sugars sporadically, mostly in the morning and they are usually above target.  Later in the day, she has only an occasional check and they appear to be better, but we do not have enough data to establish trends.  She tells me that she is missing the Fiasp injections as she forgets to inject it before a meal.  She takes it more consistently at night.  We discussed about increasing the Lantus dose and also the Fiasp dose.  For now, since Candie Mile is on national backorder, I called in Lyumjev for her.  If this is not covered, we may need NovoLog.  We also discussed about possibly increasing Mounjaro but she just refilled this.  She also has a supply of Ozempic 2 mg at home.  She would like to use these before increasing the dose. -I strongly recommended to try to get the CGM.  I am hoping that  this is covered by her insurance.  I do not feel that we can improve her diabetes until we have better understanding of her blood sugars. - I suggested to:  Patient Instructions  Please continue: - Metformin 2000 mg with dinner - Farxiga 10 mg in am - Mounjaro 7.5 mg weekly  Use:  - FiAsp 10-15 units before all 3 meals  - Lantus 25 units at bedtime, then increase to 30 units if needed  Try to get the CGM.  Please return in 3 months.  - we checked her HbA1c: 10.5% (still very high) - advised to check sugars at different times of the day - 4x a day, rotating check times - advised for yearly eye exams >> she is UTD - return to clinic in 3 months  2. Obesity class 2 -continue SGLT 2 inhibitor and GLP-1 receptor agonist which should also help with weight loss -Weight was approximately stable at last visit -She gained 2  pounds since then.  3. HL - Reviewed latest lipid panel from 08/2021: Fractions at goal - Continues atorvastatin 40 mg daily without side effects. -She is due for another lipid panel-will check this today  Carlus Pavlov, MD PhD [facility] Endocrinology

## 2023-03-16 NOTE — Patient Instructions (Addendum)
Please continue: - Metformin 2000 mg with dinner - Farxiga 10 mg in am - Mounjaro 7.5 mg weekly  Use:  - FiAsp 10-15 units before all 3 meals  - Lantus 25 units at bedtime, then increase to 30 units if needed  Try to get the CGM.  Please return in 3 months.

## 2023-03-18 LAB — MICROALBUMIN / CREATININE URINE RATIO
Creatinine, Urine: 107.5 mg/dL
Microalb/Creat Ratio: 24 mg/g{creat} (ref 0–29)
Microalbumin, Urine: 25.7 ug/mL

## 2023-03-18 LAB — LIPID PANEL
Chol/HDL Ratio: 2.5 ratio (ref 0.0–4.4)
Cholesterol, Total: 142 mg/dL (ref 100–199)
HDL: 56 mg/dL (ref >39)
LDL Chol Calc (NIH): 68 mg/dL (ref 0–99)
Triglycerides: 100 mg/dL (ref 0–149)
VLDL Cholesterol Cal: 18 mg/dL (ref 5–40)

## 2023-03-22 ENCOUNTER — Other Ambulatory Visit: Payer: Self-pay | Admitting: Internal Medicine

## 2023-03-22 DIAGNOSIS — R809 Proteinuria, unspecified: Secondary | ICD-10-CM

## 2023-03-26 ENCOUNTER — Encounter: Payer: Self-pay | Admitting: Internal Medicine

## 2023-03-27 MED ORDER — NOVOLOG FLEXPEN 100 UNIT/ML ~~LOC~~ SOPN: 6.0000 [IU] | PEN_INJECTOR | Freq: Every day | SUBCUTANEOUS | 3 refills | Status: DC

## 2023-03-27 NOTE — Telephone Encounter (Signed)
Please Advise on Alternative

## 2023-05-02 ENCOUNTER — Other Ambulatory Visit: Payer: Self-pay

## 2023-05-02 MED ORDER — IVABRADINE HCL 5 MG PO TABS: 5.0000 mg | ORAL_TABLET | Freq: Two times a day (BID) | ORAL | 1 refills | Status: DC

## 2023-06-01 ENCOUNTER — Other Ambulatory Visit: Payer: Self-pay

## 2023-06-18 ENCOUNTER — Ambulatory Visit (INDEPENDENT_AMBULATORY_CARE_PROVIDER_SITE_OTHER): Payer: No Typology Code available for payment source | Admitting: Internal Medicine

## 2023-06-18 ENCOUNTER — Encounter: Payer: Self-pay | Admitting: Internal Medicine

## 2023-06-18 VITALS — BP 124/72 | HR 73 | Ht 63.0 in | Wt 202.0 lb

## 2023-06-18 DIAGNOSIS — E785 Hyperlipidemia, unspecified: Secondary | ICD-10-CM | POA: Diagnosis not present

## 2023-06-18 DIAGNOSIS — R809 Proteinuria, unspecified: Secondary | ICD-10-CM | POA: Diagnosis not present

## 2023-06-18 DIAGNOSIS — E669 Obesity, unspecified: Secondary | ICD-10-CM

## 2023-06-18 DIAGNOSIS — E1129 Type 2 diabetes mellitus with other diabetic kidney complication: Secondary | ICD-10-CM

## 2023-06-18 DIAGNOSIS — Z7984 Long term (current) use of oral hypoglycemic drugs: Secondary | ICD-10-CM

## 2023-06-18 DIAGNOSIS — Z7985 Long-term (current) use of injectable non-insulin antidiabetic drugs: Secondary | ICD-10-CM

## 2023-06-18 DIAGNOSIS — Z794 Long term (current) use of insulin: Secondary | ICD-10-CM | POA: Diagnosis not present

## 2023-06-18 LAB — POCT GLYCOSYLATED HEMOGLOBIN (HGB A1C): Hemoglobin A1C: 9.8 % — AB (ref 4.0–5.6)

## 2023-06-18 MED ORDER — DEXCOM G7 SENSOR MISC: 3.0000 | 4 refills | Status: DC

## 2023-06-18 MED ORDER — FIASP FLEXTOUCH 100 UNIT/ML ~~LOC~~ SOPN: PEN_INJECTOR | SUBCUTANEOUS | 3 refills | Status: DC

## 2023-06-18 MED ORDER — BD PEN NEEDLE NANO 2ND GEN 32G X 4 MM MISC: 3 refills | Status: DC

## 2023-06-18 NOTE — Patient Instructions (Addendum)
Please continue: - Metformin 2000 mg with dinner - Farxiga 10 mg in am - FiAsp 10-15 units before all 3 meals   Change: - Lantus 30 units but move it in am  Alternate: - Mounjaro 7.5 mg with Ozempic 2 mg every other week  Please return in 3 months.

## 2023-06-18 NOTE — Progress Notes (Signed)
Patient ID: Laurie Small, female   DOB: Sep 18, 1975, 47 y.o.   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 Last visit 3 mo ago.  Interim history: No increased urination, blurry vision, nausea, chest pain.    Reviewed HbA1c levels: Lab Results  Component Value Date   HGBA1C 10.5 (A) 03/16/2023   HGBA1C 10.5 (A) 10/05/2022   HGBA1C 9.3 (A) 07/06/2022  05/17/2021: HbA1c calculated from fructosamine is 7%, much better than the directly measured HbA1c. 09/13/2020: HbA1c calculated from fructosamine is 5.9%, much better than the directly measured HbA1c, although slightly lower than expected from her log 05/23/2019: HbA1c calculated from fructosamine is 5.77%, in this case, however, discrepant with the sugars at home. 05/20/2018: HbA1c calculated from the fructosamine is 6.27%, still excellent. 01/07/2018: HbA1c calculated from fructosamine is 6.1% (great, slightly higher than before!) 03/22/2017: HbA1c calculated from fructosamine is 5.9%!  12/14/2016: HbA1c calculated from fructosamine is 6.6%. 09/14/2016: HbA1c calculated from fructosamine is MUCH lower, at 6.8%! 05/31/2015: HbA1c 10% 09/15/2014: HbA1c 9.6% 04/23/2014: HbA1c 10.9% 01/20/2014: HbA1c 12.9%  Pt is on a regimen of: - Metformin 1000 mg 2x a day, with meals - Farxiga 10 mg in am - Mounjaro 7.5 mg weekly or Ozempic 2 mg weekly - FiAsp 5-8 units before a larger meal (added 08/2021) >> 6-8 units before all meals >> just in am!? >>  6 to 12 >> 10-15 units before all 3 meals - Basaglar 8 units >> 10 >> Lantus 20 >> 25 units daily  She was on Onglyza (too expensive).  She was on JanuMet (stopped being effective), then Januvia >> stopped when starting Trulicity She was on Victoza (trial period). We changed from Victoza to Ozempic 08/2018. We stopped  glipizide in 10/2022.  :    Prev. - am: 79, 94, 114-240 >> 123-190, 200 >> 114, 132-228, 276 - 2h after b'fast: 200, 225 >> 215 >> n/c >> 300 >> n/c - before lunch: 160, 223 >> 180-200, 254, 313, 363 >> n/c - 2h after lunch: 186 >> n/c >> 247 >> 200 >> n/c - before dinner: 110-125 >> 178 >> n/c >> 170 >> n/c - 2h after dinner:184 >> 190 >> n/c >> 132, 274 >> n/c  - bedtime: 133-180 >> 200-300 >> n/c >> 118, 148 - nighttime: 85-142, 195 >> 73, 75, 214 >> n/c Lowest sugar was 79 >> 120 >> 123 >> 114 >> 99; it is unclear at which level she has hypoglycemia awareness Highest sugar was 285 >> 250 >> 363 >> 276 >> 300s.  Glucometer: FreeStyle Ultra mini >> ReliOn  Pt's meals are: - Breakfast: fruit, yoghurt, oatmeal, eggs, grits, - Lunch: pasta, sandwich, salad - Dinner: meat + veggies - Snacks: 3  During the coronavirus pandemic, she had to change her job and this was a stressful. She works from home and dinner is usually late.  -+ h/o CKD, last BUN/creatinine:  Comp Metabolic Panel Reviewed date:01/29/2023 08:41:36 AM Interpretation:GFR 58 Performing Lab: Notes/Report: Testing performed at: [BN] [facility], [address] [place], [place], [address], Phone: [phone], Laboratory Director: Jolene Schimke, MD  Glucose 208 70-99 mg/dL    BUN 22 8-65 mg/dL    Creatinine 7.84 6.96-2.95 mg/dL    eGFR 58 >28 UX/LKG/4.01    Sodium 142 134-144 mmol/L    Potassium 4.9 3.5-5.2 mmol/L    Chloride 99 96-106 mmol/L  Carbon Dioxide, Total 25 20-29 mmol/L    Calcium 10.0 8.7-10.2 mg/dL    CA-corrected 1.61 0.96-04.54 mg/dL    Protein, Total 7.4 0.9-8.1 g/dL    Albumin 4.9 1.9-1.4 g/dL    Bilirubin, Total <7.8 0.0-1.2 mg/dL    Alkaline Phosphatase 71 44-121 IU/L    AST (SGOT) 33 0-40 IU/L    ALT (SGPT) 27 0-32 IU/L    BUN/Creatinine Ratio 19 9-23    Globulin, Total 2.5 1.5-4.5 g/dL    Lab Results  Component Value Date   BUN 22 03/26/2020   CREATININE 1.19 (H)  03/26/2020   Lab Results  Component Value Date   GFRAA 63 03/26/2020   GFRAA 82 01/07/2018    No recent microalbuminuria: Lab Results  Component Value Date   MICRALBCREAT 24 03/16/2023   MICRALBCREAT 2.5 03/26/2020   MICRALBCREAT 1.0 01/07/2018  03/27/2019: ACR 21.2 ACR  (09/15/2014): 44.4 ACR (01/20/2014): 79.9 On ramipril.  -+ HL; last set of lipids: Lab Results  Component Value Date   CHOL 142 03/16/2023   HDL 56 03/16/2023   LDLCALC 68 03/16/2023   TRIG 100 03/16/2023   CHOLHDL 2.5 03/16/2023  On Lipitor 40.  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 Eye exam coming up tomorrow.  -No numbness and tingling in her feet.  She has heel spurs.  Latest foot exam: 03/16/2023.  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  She stopped Digoxin >> on Corlanor now. 2D echo by her cardiologist >> EF improved to 50 to 55%.  ROS: + see HPI  I reviewed pt's medications, allergies, PMH, social hx, family hx, and changes were documented in the history of present illness. Otherwise, unchanged from my initial visit note.  Past Medical History:  Diagnosis Date   Depression    NICM (nonischemic cardiomyopathy) (HCC)    2D ECHO, 12/26/2011 - EF 45-50%, left ventricle borderline dilated   Past Surgical History:  Procedure Laterality Date   CARDIAC CATHETERIZATION  01/06/2002   Dilated nonsichemic cardiopmyopathy, EF 28%, at least 2+ angiographic mitral regurgitation   Social History   Social History   Marital Status: Married    Spouse Name: N/A   Number of Children: 1   Occupational History    claims benefits specialist    Social History Main Topics   Smoking status: Never Smoker    Smokeless tobacco: Not on file   Alcohol Use: No   Drug Use: No   Current Outpatient Medications on File Prior to Visit  Medication Sig Dispense Refill   allopurinol (ZYLOPRIM) 100 MG tablet Take 1 tablet by mouth daily.      aspirin 325 MG tablet Take 325 mg by mouth daily.     atorvastatin (LIPITOR) 40 MG tablet TAKE 1 TABLET BY MOUTH DAILY AT 6 PM. 90 tablet 3   carvedilol (COREG) 25 MG tablet TAKE 1 TABLET TWICE A DAY  WITH MEALS 180 tablet 3   Continuous Glucose Sensor (DEXCOM G7 SENSOR) MISC 3 each by Does not apply route every 30 (thirty) days. Apply 1 sensor every 10 days 9 each 4   Continuous Glucose Sensor (FREESTYLE LIBRE 3 SENSOR) MISC 1 each by Does not apply route every 14 (fourteen) days. 6 each 3   dapagliflozin propanediol (FARXIGA) 10 MG TABS tablet TAKE 1 TABLET DAILY 90 tablet 3   diphenhydrAMINE (BENADRYL) 25 MG tablet Take 25 mg by  mouth daily.     ferrous sulfate 325 (65 FE) MG EC tablet Take 1 tablet by mouth daily.     hydrochlorothiazide (HYDRODIURIL) 25 MG tablet TAKE 1 TABLET DAILY 90 tablet 3   hydrocortisone 2.5 % cream as needed.     insulin aspart (NOVOLOG FLEXPEN) 100 UNIT/ML FlexPen Inject 6-12 Units into the skin daily before supper. 15 mL 3   insulin glargine (LANTUS SOLOSTAR) 100 UNIT/ML Solostar Pen Inject 30 Units into the skin daily.     Insulin Lispro-aabc (LYUMJEV KWIKPEN) 100 UNIT/ML KwikPen Inject 6-12 Units into the skin 3 (three) times daily before meals. 30 mL 3   Insulin Pen Needle (BD PEN NEEDLE NANO 2ND GEN) 32G X 4 MM MISC USE TO TEST ONCE A DAY 100 each 3   ivabradine (CORLANOR) 5 MG TABS tablet Take 1 tablet (5 mg total) by mouth 2 (two) times daily with a meal. 180 tablet 1   metFORMIN (GLUCOPHAGE) 1000 MG tablet TAKE 1 TABLET BY MOUTH TWICE A DAY WITH FOOD 180 tablet 1   OneTouch Delica Lancets 33G MISC Use 3x a day 200 each 3   ONETOUCH VERIO test strip USE TO CHECK BLOOD SUGAR   TWO TIMES A DAY 100 strip 7   ramipril (ALTACE) 5 MG capsule TAKE 2 CAPSULES BY MOUTH IN THE MORNING AND 1 CAPSULE IN THE EVENING. 270 capsule 3   spironolactone (ALDACTONE) 25 MG tablet Take 0.5 tablets (12.5 mg total) by mouth daily. 45 tablet 3   tirzepatide (MOUNJARO) 7.5 MG/0.5ML  Pen INJECT 7.5 MG SUBCUTANEOUSLY WEEKLY 2 mL 3   No current facility-administered medications on file prior to visit.   Allergies  Allergen Reactions   Betadine [Povidone Iodine]    Iodine    Peanuts [Peanut Oil]    Shellfish Allergy    Other Hives and Rash    Sunlight and Grass    Family History  Problem Relation Age of Onset   Hypertension Mother    GI Bleed Mother    Pneumonia Maternal Grandmother    Cancer Paternal Grandmother        Breast cancer   Breast cancer Paternal Grandmother    PE: BP 124/72   Pulse 73   Ht 5\' 3"  (1.6 m)   Wt 202 lb (91.6 kg)   LMP 10/01/2020   SpO2 98%   BMI 35.78 kg/m   Wt Readings from Last 3 Encounters:  06/18/23 202 lb (91.6 kg)  03/16/23 205 lb 9.6 oz (93.3 kg)  11/17/22 203 lb (92.1 kg)   Constitutional: overweight, in NAD Eyes: no exophthalmos ENT: no masses palpated in neck, no cervical lymphadenopathy Cardiovascular: RRR, No MRG Respiratory: CTA B Musculoskeletal: no deformities Skin: no rashes Neurological: no tremor with outstretched hands  ASSESSMENT: 1. DM2, insulin-dependent, uncontrolled, with complications - CKD - DR  Cardiologist: Dr Rennis Golden  2. Obesity class 2  3. HL  PLAN:  1. Patient with longstanding, uncontrolled, type 2 diabetes, on a complex antidiabetic regimen with metformin, SGLT2 inhibitor, basal-bolus insulin and weekly GLP-1/GIP receptor agonist, with still poor control.  Since last visit, she was able to start a CGM, freestyle libre, but this was not giving her accurate results, and her fingerstick values were strikingly different from the CGM values.  Also, her sensors would not last beyond approximately 7 days.  She changed to the Dexcom G7 CGM in the last few days and this is giving her much better results. CGM interpretation: -At today's visit, we reviewed  her CGM downloads: It appears that 59% of values are in target range (goal >70%), while 40% are higher than 180 (goal <25%), and 1% are  lower than 70 (goal <4%).  The calculated average blood sugar is 174.  The projected HbA1c for the next 3 months (GMI) is 7.5%. -Reviewing the CGM trends, sugars appear to be improving overnight but increasing after breakfast and the remaining elevated until after dinner.  At today's visit, we discussed about moving Lantus in the morning to avoid precipitous drops in blood sugars overnight and hopefully help with blood sugars during the day.  Will continue metformin and Farxiga at the same doses.  She is currently taking Mounjaro for 1 month and Ozempic for the next, but I advised her to alternate between the 2, as she has a lot of Ozempic at home and would like to use it. - I suggested to:  Patient Instructions  Please continue: - Metformin 2000 mg with dinner - Farxiga 10 mg in am - FiAsp 10-15 units before all 3 meals   Change: - Lantus 30 units but move it in am  Alternate: - Mounjaro 7.5 mg with Ozempic 2 mg every other week  Please return in 3 months.  - advised to check sugars at different times of the day - 4x a day, rotating check times - advised for yearly eye exams >> she is UTD - return to clinic in 3 months  2. Obesity class 2 -continue SGLT 2 inhibitor and GLP-1 receptor agonist which should also help with weight loss -She gained 2 pounds before last visit and lost 3 pounds since then  3. HL - Reviewed latest lipid panel  Lab Results  Component Value Date   CHOL 142 03/16/2023   HDL 56 03/16/2023   LDLCALC 68 03/16/2023   TRIG 100 03/16/2023   CHOLHDL 2.5 03/16/2023  - Continues the statin (Lipitor 40 mg daily) without side effects.  Carlus Pavlov, MD PhD [facility] Endocrinology

## 2023-06-29 ENCOUNTER — Other Ambulatory Visit: Payer: Self-pay | Admitting: Family Medicine

## 2023-06-29 DIAGNOSIS — N6320 Unspecified lump in the left breast, unspecified quadrant: Secondary | ICD-10-CM

## 2023-07-12 ENCOUNTER — Other Ambulatory Visit: Payer: Self-pay | Admitting: Internal Medicine

## 2023-08-24 ENCOUNTER — Encounter: Payer: Self-pay | Admitting: Internal Medicine

## 2023-08-24 ENCOUNTER — Other Ambulatory Visit: Payer: Self-pay | Admitting: Internal Medicine

## 2023-08-25 ENCOUNTER — Other Ambulatory Visit: Payer: Self-pay | Admitting: Internal Medicine

## 2023-08-27 ENCOUNTER — Telehealth: Payer: Self-pay

## 2023-08-27 NOTE — Telephone Encounter (Signed)
Sample  Medication:Lantus Dose: U100 Quantity:1 pen WUJ:[id number] EXP:01/21/24  Dicie Beam

## 2023-09-18 ENCOUNTER — Encounter: Payer: Self-pay | Admitting: Internal Medicine

## 2023-09-18 ENCOUNTER — Ambulatory Visit: Payer: No Typology Code available for payment source | Admitting: Internal Medicine

## 2023-09-18 VITALS — BP 120/80 | HR 70 | Ht 63.0 in | Wt 206.8 lb

## 2023-09-18 DIAGNOSIS — R809 Proteinuria, unspecified: Secondary | ICD-10-CM | POA: Diagnosis not present

## 2023-09-18 DIAGNOSIS — E785 Hyperlipidemia, unspecified: Secondary | ICD-10-CM | POA: Diagnosis not present

## 2023-09-18 DIAGNOSIS — E669 Obesity, unspecified: Secondary | ICD-10-CM | POA: Diagnosis not present

## 2023-09-18 DIAGNOSIS — Z7985 Long-term (current) use of injectable non-insulin antidiabetic drugs: Secondary | ICD-10-CM

## 2023-09-18 DIAGNOSIS — E1129 Type 2 diabetes mellitus with other diabetic kidney complication: Secondary | ICD-10-CM | POA: Diagnosis not present

## 2023-09-18 DIAGNOSIS — Z7984 Long term (current) use of oral hypoglycemic drugs: Secondary | ICD-10-CM

## 2023-09-18 DIAGNOSIS — Z794 Long term (current) use of insulin: Secondary | ICD-10-CM

## 2023-09-18 LAB — POCT GLYCOSYLATED HEMOGLOBIN (HGB A1C): Hemoglobin A1C: 9.2 % — AB (ref 4.0–5.6)

## 2023-09-18 MED ORDER — FIASP FLEXTOUCH 100 UNIT/ML ~~LOC~~ SOPN: PEN_INJECTOR | SUBCUTANEOUS | Status: DC

## 2023-09-18 MED ORDER — BD PEN NEEDLE NANO 2ND GEN 32G X 4 MM MISC: 3 refills | Status: DC

## 2023-09-18 MED ORDER — DAPAGLIFLOZIN PROPANEDIOL 10 MG PO TABS: 10.0000 mg | ORAL_TABLET | Freq: Every day | ORAL | 3 refills | Status: DC

## 2023-09-18 MED ORDER — LANTUS SOLOSTAR 100 UNIT/ML ~~LOC~~ SOPN: 30.0000 [IU] | PEN_INJECTOR | Freq: Every day | SUBCUTANEOUS | 3 refills | Status: DC

## 2023-09-18 MED ORDER — METFORMIN HCL 1000 MG PO TABS: 1000.0000 mg | ORAL_TABLET | Freq: Two times a day (BID) | ORAL | 3 refills | Status: AC

## 2023-09-18 NOTE — Progress Notes (Signed)
 Patient ID: Laurie Small, female   DOB: [birth year], 48 y.o.   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 Last visit 3 mo ago.  Interim history: No increased urination, blurry vision, nausea, chest pain.    Reviewed HbA1c levels: Lab Results  Component Value Date   HGBA1C 9.8 (A) 06/18/2023   HGBA1C 10.5 (A) 03/16/2023   HGBA1C 10.5 (A) 10/05/2022  05/17/2021: HbA1c calculated from fructosamine is 7%, much better than the directly measured HbA1c. 09/13/2020: HbA1c calculated from fructosamine is 5.9%, much better than the directly measured HbA1c, although slightly lower than expected from her log 05/23/2019: HbA1c calculated from fructosamine is 5.77%, in this case, however, discrepant with the sugars at home. 05/20/2018: HbA1c calculated from the fructosamine is 6.27%, still excellent. 01/07/2018: HbA1c calculated from fructosamine is 6.1% (great, slightly higher than before!) 03/22/2017: HbA1c calculated from fructosamine is 5.9%!  12/14/2016: HbA1c calculated from fructosamine is 6.6%. 09/14/2016: HbA1c calculated from fructosamine is MUCH lower, at 6.8%! 05/31/2015: HbA1c 10% 09/15/2014: HbA1c 9.6% 04/23/2014: HbA1c 10.9% 01/20/2014: HbA1c 12.9%  Pt is on a regimen of: - Metformin 1000 mg 2x a day, with meals - Farxiga 10 mg in am - Mounjaro 7.5 mg weekly alternating with Ozempic 2 mg weekly - FiAsp 5-8 units before a larger meal (added 08/2021) >> 6-8 units before all meals >> just in am!? >>  6 to 12 >> 10-15 >> 25-30 units before all 3 meals - Basaglar 8 units >> 10 >> Lantus 20 >> 25 units daily >> 30 units in am She was on Onglyza (too expensive).  She was on JanuMet (stopped being effective), then Januvia >> stopped when starting Trulicity She was on Victoza (trial period). We changed from  Victoza to Ozempic 08/2018. We stopped glipizide in 10/2022.  She checks her blood sugars more than 4 times daily:  Prev.: :     Lowest sugar was 79 >> .Marland Kitchen.114 >> 99 >> 50s; it is unclear at which level she has hypoglycemia awareness Highest sugar was 363 >> 276 >> 300s >> 300s.  Glucometer: FreeStyle Ultra mini >> ReliOn  Pt's meals are: - Breakfast: fruit, yoghurt, oatmeal, eggs, grits,  >> Yoplait yogurt with granola + fruit (strawberry, pineapple) - Lunch: pasta, sandwich, salad - Dinner: meat + veggies - Snacks: 3  During the coronavirus pandemic, she had to change her job and this was a stressful. She works from home and dinner is usually late.  -+ h/o CKD, last BUN/creatinine:  Comp Metabolic Panel Reviewed date:01/29/2023 08:41:36 AM Interpretation:GFR 58 Performing Lab: Notes/Report: Testing performed at: [BN] [facility], [address], [place], [place], [address], Phone: [phone], Laboratory Director: Jolene Schimke, MD  Glucose 208 70-99 mg/dL    BUN 22 8-65 mg/dL    Creatinine 7.84 6.96-2.95 mg/dL    eGFR 58 >28 UX/LKG/4.01    Sodium 142 134-144 mmol/L    Potassium 4.9 3.5-5.2 mmol/L    Chloride 99 96-106 mmol/L    Carbon Dioxide, Total 25 20-29 mmol/L    Calcium 10.0 8.7-10.2 mg/dL    CA-corrected 0.27 2.53-66.44 mg/dL    Protein, Total 7.4 0.3-4.7 g/dL    Albumin 4.9 4.2-5.9 g/dL    Bilirubin, Total <5.6 0.0-1.2 mg/dL    Alkaline Phosphatase 71 44-121 IU/L    AST (SGOT) 33 0-40 IU/L    ALT (SGPT) 27 0-32 IU/L    BUN/Creatinine  Ratio 19 9-23    Globulin, Total 2.5 1.5-4.5 g/dL    Lab Results  Component Value Date   BUN 22 03/26/2020   CREATININE 1.19 (H) 03/26/2020   Lab Results  Component Value Date   GFRAA 63 03/26/2020   GFRAA 82 01/07/2018    No recent microalbuminuria: Lab Results  Component Value Date   MICRALBCREAT 24 03/16/2023   MICRALBCREAT 2.5 03/26/2020   MICRALBCREAT 1.0 01/07/2018  03/27/2019: ACR 21.2 ACR   (09/15/2014): 44.4 ACR (01/20/2014): 79.9 On ramipril.  -+ HL; last set of lipids: Lab Results  Component Value Date   CHOL 142 03/16/2023   HDL 56 03/16/2023   LDLCALC 68 03/16/2023   TRIG 100 03/16/2023   CHOLHDL 2.5 03/16/2023  On Lipitor 40.  - last eye exam was on 06/2023: + DR reportedly.  -No numbness and tingling in her feet.  She has heel spurs.  Latest foot exam: 03/16/2023.  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  She stopped Digoxin >> on Corlanor now. 2D echo by her cardiologist >> EF improved to 50 to 55%.  ROS: + see HPI  I reviewed pt's medications, allergies, PMH, social hx, family hx, and changes were documented in the history of present illness. Otherwise, unchanged from my initial visit note.  Past Medical History:  Diagnosis Date   Depression    NICM (nonischemic cardiomyopathy) (HCC)    2D ECHO, 12/26/2011 - EF 45-50%, left ventricle borderline dilated   Past Surgical History:  Procedure Laterality Date   CARDIAC CATHETERIZATION  01/06/2002   Dilated nonsichemic cardiopmyopathy, EF 28%, at least 2+ angiographic mitral regurgitation   Social History   Social History   Marital Status: Married    Spouse Name: N/A   Number of Children: 1   Occupational History    claims benefits specialist    Social History Main Topics   Smoking status: Never Smoker    Smokeless tobacco: Not on file   Alcohol Use: No   Drug Use: No   Current Outpatient Medications on File Prior to Visit  Medication Sig Dispense Refill   allopurinol (ZYLOPRIM) 100 MG tablet Take 1 tablet by mouth daily.     aspirin 325 MG tablet Take 325 mg by mouth daily.     atorvastatin (LIPITOR) 40 MG tablet TAKE 1 TABLET BY MOUTH DAILY AT 6 PM. 90 tablet 0   carvedilol (COREG) 25 MG tablet TAKE 1 TABLET TWICE A DAY  WITH MEALS 180 tablet 3   Continuous Glucose Sensor (DEXCOM G7 SENSOR) MISC 3 each by Does not apply route  every 30 (thirty) days. Apply 1 sensor every 10 days 9 each 4   dapagliflozin propanediol (FARXIGA) 10 MG TABS tablet TAKE 1 TABLET DAILY 90 tablet 3   diphenhydrAMINE (BENADRYL) 25 MG tablet Take 25 mg by mouth daily.     ferrous sulfate 325 (65 FE) MG EC tablet Take 1 tablet by mouth daily.     hydrochlorothiazide (HYDRODIURIL) 25 MG tablet TAKE 1 TABLET DAILY 90 tablet 3   hydrocortisone 2.5 % cream as needed.     insulin aspart (FIASP FLEXTOUCH) 100 UNIT/ML FlexTouch Pen Inject under skin up to 45 units per day as advised 30 mL 3   insulin aspart (NOVOLOG FLEXPEN) 100 UNIT/ML FlexPen Inject 6-12 Units into the skin daily before supper. 15 mL 3   insulin glargine (LANTUS SOLOSTAR) 100 UNIT/ML Solostar Pen Inject  30 Units into the skin daily. 15 mL 1   Insulin Pen Needle (BD PEN NEEDLE NANO 2ND GEN) 32G X 4 MM MISC USE TO TEST 4x A DAY 400 each 3   ivabradine (CORLANOR) 5 MG TABS tablet Take 1 tablet (5 mg total) by mouth 2 (two) times daily with a meal. 180 tablet 1   metFORMIN (GLUCOPHAGE) 1000 MG tablet TAKE 1 TABLET BY MOUTH TWICE A DAY WITH FOOD 180 tablet 1   OneTouch Delica Lancets 33G MISC Use 3x a day 200 each 3   ONETOUCH VERIO test strip USE TO CHECK BLOOD SUGAR   TWO TIMES A DAY 100 strip 7   ramipril (ALTACE) 5 MG capsule TAKE 2 CAPSULES BY MOUTH IN THE MORNING AND 1 CAPSULE IN THE EVENING. 270 capsule 3   Semaglutide, 2 MG/DOSE, (OZEMPIC, 2 MG/DOSE,) 8 MG/3ML SOPN INJECT 2 MG INTO THE SKIN ONCE A WEEK. INSURANCE PAYS FOR 1 MONTH 3 mL 11   spironolactone (ALDACTONE) 25 MG tablet TAKE 1/2 TABLET BY MOUTH EVERY DAY 45 tablet 0   tirzepatide (MOUNJARO) 7.5 MG/0.5ML Pen INJECT 7.5 MG SUBCUTANEOUSLY WEEKLY 2 mL 3   No current facility-administered medications on file prior to visit.   Allergies  Allergen Reactions   Betadine [Povidone Iodine]    Iodine    Peanuts [Peanut Oil]    Shellfish Allergy    Other Hives and Rash    Sunlight and Grass    Family History  Problem  Relation Age of Onset   Hypertension Mother    GI Bleed Mother    Pneumonia Maternal Grandmother    Cancer Paternal Grandmother        Breast cancer   Breast cancer Paternal Grandmother    PE: BP 120/80   Pulse 70   Ht 5\' 3"  (1.6 m)   Wt 206 lb 12.8 oz (93.8 kg)   LMP 10/01/2020   SpO2 98%   BMI 36.63 kg/m   Wt Readings from Last 3 Encounters:  09/18/23 206 lb 12.8 oz (93.8 kg)  06/18/23 202 lb (91.6 kg)  03/16/23 205 lb 9.6 oz (93.3 kg)   Constitutional: overweight, in NAD Eyes: no exophthalmos ENT: no masses palpated in neck, no cervical lymphadenopathy Cardiovascular: RRR, No MRG Respiratory: CTA B Musculoskeletal: no deformities Skin: no rashes Neurological: no tremor with outstretched hands  ASSESSMENT: 1. DM2, insulin-dependent, uncontrolled, with complications - CKD - DR  Cardiologist: Dr Rennis Golden  2. Obesity class 2  3. HL  PLAN:  1. Patient with longstanding, uncontrolled, type 2 diabetes, on a complex antidiabetic regimen with basal/bolus insulin, metformin, SGLT2 inhibitor and also GLP-1 or GLP-1/GIP receptor agonist depending on the availability -In the past, she had problems with accuracy for the freestyle libre CGM but now getting better results with the Dexcom G7 CGM -At last visit, sugars were improving overnight but increasing after breakfast and remaining elevated until after dinner.  We moved Lantus to the morning due to the precipitous drops in blood sugars overnight.  I also advised her to alternate [facility] with Ozempic as she had Ozempic at home and wanted to use it. CGM interpretation: -At today's visit, we reviewed her CGM downloads: It appears that 46% of values are in target range (goal >70%), while 54% are higher than 180 (goal <25%), and 0% are lower than 70 (goal <4%).  The calculated average blood sugar is 189.  The projected HbA1c for the next 3 months (GMI) is 7.8%. -Reviewing the CGM trends, sugars  appear to be decreasing significantly  overnight, after dinner, and then increasing greatly after breakfast and then drastically after dinner.  She is bolusing more Fiasp than recommended as she does bolus before, rather than after meals.  She does have granola and occasionally pineapple in the morning and we discussed about staying with lower glycemic index fruit.  I also advised her to try to avoid granola unless she plans to exercise right after, due to the high glycemic index, and may be replaced this with nuts.  I advised her to do different experiment with different foods we did discuss about increasing the dose of Lantus in the morning to eliminate the need for such high doses of Fiasp.   -For now, if she has a lot of Ozempic at home, we will continue to alternate [facility] with Ozempic.  I also advised her to tell the pharmacy to stop the auto refills for Ozempic. - I suggested to:  Patient Instructions  Please use: - Metformin 2000 mg with dinner - Farxiga 10 mg in am - FiAsp 20-25 units before all 3 meals  - Lantus 40 units in am - Mounjaro 7.5 mg alternating with Ozempic 2 mg every other week  Try to use unsweetened almond milk instead of creamer.  Please return in 3-4 months.  - we checked her HbA1c: 9.2% (slightly lower) - advised to check sugars at different times of the day - 4x a day, rotating check times - advised for yearly eye exams >> she is UTD - return to clinic in 3-4 months  2. Obesity class 2 -continue SGLT 2 inhibitor and GLP-1 receptor agonist which should also help with weight loss -She lost 3 pounds before last visit and gained 4 pounds since then  3. HL -Latest lipid panel was reviewed from 02/2023: Fractions at goal: Lab Results  Component Value Date   CHOL 142 03/16/2023   HDL 56 03/16/2023   LDLCALC 68 03/16/2023   TRIG 100 03/16/2023   CHOLHDL 2.5 03/16/2023  -She continues Lipitor 40 mg daily without side effects  Carlus Pavlov, MD PhD [facility] Endocrinology

## 2023-09-18 NOTE — Patient Instructions (Addendum)
 Please use: - Metformin 2000 mg with dinner - Farxiga 10 mg in am - FiAsp 20-25 units before all 3 meals  - Lantus 40 units in am - Mounjaro 7.5 mg alternating with Ozempic 2 mg every other week  Try to use unsweetened almond milk instead of creamer.  Please return in 3-4 months.

## 2023-09-19 NOTE — Addendum Note (Signed)
 Addended by: Pollie Meyer on: 09/19/2023 09:04 AM   Modules accepted: Orders

## 2023-10-05 ENCOUNTER — Other Ambulatory Visit: Payer: Self-pay | Admitting: Internal Medicine

## 2023-10-08 ENCOUNTER — Ambulatory Visit (AMBULATORY_SURGERY_CENTER): Admitting: *Deleted

## 2023-10-08 VITALS — Ht 63.0 in | Wt 202.0 lb

## 2023-10-08 DIAGNOSIS — Z1211 Encounter for screening for malignant neoplasm of colon: Secondary | ICD-10-CM

## 2023-10-08 MED ORDER — SUFLAVE 178.7 G PO SOLR: 1.0000 | Freq: Once | ORAL | 0 refills | Status: DC

## 2023-10-08 MED ORDER — SUFLAVE 178.7 G PO SOLR: 1.0000 | Freq: Once | ORAL | 0 refills | Status: AC

## 2023-10-08 NOTE — Progress Notes (Signed)
 Pt's name and DOB verified at the beginning of the pre-visit wit 2 identifiers  Pt denies any difficulty with ambulating,sitting, laying down or rolling side to side  Pt has no issues with ambulation   Pt has no issues moving head neck or swallowing  No egg or soy allergy known to patient   No issues known to pt with past sedation with any surgeries or procedures  No FH of Malignant Hyperthermia  Pt is not on diet pills or shots  Pt is not on home 02   Pt is not on blood thinners   Pt denies issues with constipation   Pt is not on dialysis  Pt denise any abnormal heart rhythms   Pt denies any upcoming cardiac testing  Chart not reviewed by CRNA prior to PV  Visit by phone  Pt states weight is 202 lb  IInstructions reviewed. Pt given [facility] main # and MD on call # prior to instructions.  Pt states understanding of instructions. Instructed to review again prior to procedure. Pt states they will.   Informed pt that they will receive a text or  call from [facility] regarding there prep med.

## 2023-10-08 NOTE — Addendum Note (Signed)
 Addended by: Danton Sewer on: 10/08/2023 03:11 PM   Modules accepted: Orders

## 2023-10-12 ENCOUNTER — Ambulatory Visit: Payer: No Typology Code available for payment source | Attending: Internal Medicine | Admitting: Internal Medicine

## 2023-10-12 ENCOUNTER — Encounter: Payer: Self-pay | Admitting: Internal Medicine

## 2023-10-12 VITALS — BP 124/66 | HR 65 | Ht 63.0 in | Wt 205.0 lb

## 2023-10-12 DIAGNOSIS — I428 Other cardiomyopathies: Secondary | ICD-10-CM | POA: Diagnosis not present

## 2023-10-12 DIAGNOSIS — I4711 Inappropriate sinus tachycardia, so stated: Secondary | ICD-10-CM | POA: Diagnosis not present

## 2023-10-12 DIAGNOSIS — I10 Essential (primary) hypertension: Secondary | ICD-10-CM

## 2023-10-12 MED ORDER — IVABRADINE HCL 5 MG PO TABS: 5.0000 mg | ORAL_TABLET | Freq: Two times a day (BID) | ORAL | 1 refills | Status: DC

## 2023-10-12 MED ORDER — SPIRONOLACTONE 25 MG PO TABS: 12.5000 mg | ORAL_TABLET | Freq: Every day | ORAL | 0 refills | Status: DC

## 2023-10-12 MED ORDER — HYDROCHLOROTHIAZIDE 25 MG PO TABS: 25.0000 mg | ORAL_TABLET | Freq: Every day | ORAL | 3 refills | Status: DC

## 2023-10-12 MED ORDER — RAMIPRIL 5 MG PO CAPS: ORAL_CAPSULE | ORAL | 3 refills | Status: AC

## 2023-10-12 MED ORDER — ATORVASTATIN CALCIUM 40 MG PO TABS: 40.0000 mg | ORAL_TABLET | Freq: Every day | ORAL | 0 refills | Status: DC

## 2023-10-12 MED ORDER — CARVEDILOL 25 MG PO TABS: 25.0000 mg | ORAL_TABLET | Freq: Two times a day (BID) | ORAL | 3 refills | Status: DC

## 2023-10-12 NOTE — Progress Notes (Signed)
 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 In 2011, her EF was 45% to 50% by echocardiogram with medical therapy. Unfortunately, after her initial diagnosis, she developed a pulmonary embolus in 2003 and was on Coumadin for about 2 years, and now is on aspirin 325 mg daily. She also has diabetes, hypertension, and obesity. She recently she tripped in her house and developed a small stress fracture and sprain of her left ankle which she has recovered from. She denies any shortness of breath with exertion, chest pain, palpitations, presyncope, syncopal symptoms.  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 She continues to feel at baseline. Interestingly, her heart rate is lower than it was at her last office visit.  [name] returns today for followup. She reports doing well denies any shortness of breath or chest pain with exertion. Unfortunately she's not been able to lose any significant amount of weight. She has had some adjustments in her diabetes medicine with the addition of glipizide, she is also taking Onglyza and metformin 1000 mg twice daily.  She is interested in possibly getting off of some of her medications.  Saw [name] back in the office today. Overall she is doing well without any symptoms of heart failure. Blood sugar has been better controlled. Weight appears to be down just a few pounds. EKG looks normal today. Her last echocardiogram however did not show significant improvement in EF, therefore I recommended we remain on her current heart failure medications. Currently she has NYHA class I symptoms.  04/28/2016  [name] returns today for  follow-up. In the past year she's done fairly well. She denies any chest pain or worsening shortness of breath. Unfortunately she's been struggling with left plantar fasciitis and she is in a walking boot today. She's actually lost 46 pounds since we last saw her. She continues to have class I heart failure symptoms. Interestingly she is tachycardic today. Initially this was thought to be due to her rushing in the office however her heart rate remained elevated even at the end of the visit. She reports compliance with her beta blocker which is actually a high dose. I don't know if this is related to worsening heart failure with sympathetic activation. She was supposed to have an echo a year ago but we did not obtain that and I like to reassess LV function since his been 2 years since her last study. Her last EF was 40-45%. Stress new heart failure treatments that are available today including Entresto and Corlanor - which may be of some benefit.  06/21/2016  [name] was seen in follow-up today. A repeat echo still shows her LVEF to be around 40%. She remains persistently tachycardiac with HR in the upper 90's and low 100's. This appears to be sinus. Blood pressure runs in the 110-120 systolic range and there is little room to increase her carvedilol which is already at 25 mg BID. She was on digoxin, but it did not seem to affect her heartrate, so I discontinued it. She may be a candidate for Corlanor. A big question is whether she has persistent, inappropriate sinus tachycardia and whether this is contributing to her cardiomyopathy.  07/28/2016  [name]  [name] returns today for follow-up. Her heart rate remains in the upper 90s. Her monitor showed heart rates between the 70s and 130s, however she is persistently in the low 90s and 100s. Blood pressure is a little higher today and she may tolerate an increase in her carvedilol. We discussed adding Corlanor, however she is not interested this time  due to cost issues.  08/29/2016  Mrs. Melinda Crutch returns for follow-up. I increased her carvedilol and blood pressure now is more in the 110-120 systolic range. Heart rate remains elevated in the 80s and 90s. This is actually on except behind given her low ejection fraction of 40%.  I discussed this with Dr. Gala Romney and although EF is not less than 35%, there is probably good reason to consider trying it for HR improvement.  09/28/2016  [name] returns today for follow-up. Interestingly her heart rate has improved in general on Ivabradine. It appears from her home measurements that her heart rate is somewhere around 70. She is on the 5 mg twice a day dosing. The only side effect she notices are the visual disturbances which seems to be worse in the morning but gets better fairly quickly.  03/30/2017  Mrs. Melinda Crutch was seen today in follow-up. She seems to noted an improvement in her heart rate on Ivabradine. Heart rate today is 67 and in general she's not had any significant tachycardia or inappropriate increases in heart rate with minimal exertion. In addition she is on carvedilol. She denies any chest pain. She occasionally has some visual disturbances in the morning however it seems to be better as the day goes on and not associated to the dose of medicine.  04/12/2018  [name] returns today for follow-up.  Overall she continues to do really well.  She is tolerated ivabradine with improvement in heart rate.  Her last echo showed her EF up to 50 to 55% as of October 2018.  She is asymptomatic with NYHA class I symptoms.  Blood pressure is well controlled today.  06/02/2020  Mrs. Koci is seen today in follow-up.  Overall she is doing well.  Her heart function had returned to near normal at 50 to 55% in 2018.  Since then she has had no heart failure symptoms.  Heart rate control has been consistent in the mid 70s on ivabradine and high dose carvedilol.  She is also on  ramipril and Aldactone.  I am hesitant to change her medications since she is done well with it.  08/12/2021  [name] returns today for follow-up.  She seems to be doing well.  Her last echo was in 2018 at which time her EF was low normal which demonstrated marked improvement.  The this is related to the number of medications she is using.  She has been on the Corlanor which has helped with her tachycardia and I think that was an issue possibly related to her nonischemic cardiomyopathy.  She continues to exercise and is asymptomatic.  Blood pressure is well controlled.  Her lipids were at target with total 138, HDL 56, triglycerides 93 and LDL 64.  The only outstanding issue was her hemoglobin A1c at 9.1%.  She is seeing Dr. Elvera Lennox with endocrinology who is working on this.  09/15/2022  Mrs. Roberson is seen today in follow-up.  Overall she says she is feeling well.  Fortunately her last echo showed normalization of her LVEF.  She has been struggling somewhat with her blood sugars.  A1c over  9%.  She has although lost some weight at least 5 pounds since I last saw her.  The other issue is the cost of Corlanor which is quite expensive for her.  There has not been any assistance option for her that we could find.  10/12/2023  Mrs. Ohlendorf is seen today in follow-up.  Since I saw her last year she is doing very well.  She denies any worsening heart failure or shortness of breath.  Her last echo in 2023 showed normalization of her LVEF.  Heart rate is still well-controlled with ivabradine.  Unfortunately she is still struggling with weight loss and her A1c is still hovering between 9 and 10% although more recently has come down some.  She is using a continuous glucose monitor and is on alternating Mounjaro and Ozempic based on their availability.   PMHx:  Past Medical History:  Diagnosis Date   Anemia    CHF (congestive heart failure) (HCC)    Clotting disorder (HCC)    Depression     Diabetes mellitus without complication (HCC)    Hyperlipidemia    NICM (nonischemic cardiomyopathy) (HCC)    2D ECHO, 12/26/2011 - EF 45-50%, left ventricle borderline dilated    Past Surgical History:  Procedure Laterality Date   c-sections     CARDIAC CATHETERIZATION  01/06/2002   Dilated nonsichemic cardiopmyopathy, EF 28%, at least 2+ angiographic mitral regurgitation   CHOLECYSTECTOMY     parthyroid removal     2001    FAMHx:  Family History  Problem Relation Age of Onset   Hypertension Mother    GI Bleed Mother    Pneumonia Maternal Grandmother    Cancer Paternal Grandmother        Breast cancer   Breast cancer Paternal Grandmother    Colon polyps Neg Hx    Colon cancer Neg Hx    Esophageal cancer Neg Hx    Rectal cancer Neg Hx    Stomach cancer Neg Hx     SOCHx:   reports that she has never smoked. She has never used smokeless tobacco. She reports that she does not drink alcohol and does not use drugs.  ALLERGIES:  Allergies  Allergen Reactions   Betadine [Povidone Iodine]    Iodine    Peanuts [Peanut Oil]    Shellfish Allergy    Other Hives and Rash    Sunlight and Grass     ROS: Pertinent items noted in HPI and remainder of comprehensive ROS otherwise negative.  HOME MEDS: Current Outpatient Medications  Medication Sig Dispense Refill   allopurinol (ZYLOPRIM) 100 MG tablet Take 1 tablet by mouth daily.     aspirin 325 MG tablet Take 325 mg by mouth daily.     atorvastatin (LIPITOR) 40 MG tablet TAKE 1 TABLET BY MOUTH DAILY AT 6 PM. 90 tablet 0   carvedilol (COREG) 25 MG tablet TAKE 1 TABLET TWICE A DAY  WITH MEALS 180 tablet 3   Continuous Glucose Sensor (DEXCOM G7 SENSOR) MISC 3 each by Does not apply route every 30 (thirty) days. Apply 1 sensor every 10 days 9 each 4   cyanocobalamin (VITAMIN B12) 500 MCG tablet Take 500 mcg by mouth daily.     dapagliflozin propanediol (FARXIGA) 10 MG TABS tablet Take 1 tablet (10 mg total) by mouth daily. 90  tablet 3   diphenhydrAMINE (BENADRYL) 25 MG tablet Take 25 mg by mouth daily. As needed     EPINEPHrine (EPIPEN 2-PAK) 0.3 mg/0.3 mL IJ  SOAJ injection as directed Injection as needed for anaphylaxis reaction for 30 days     ferrous sulfate 325 (65 FE) MG EC tablet Take 1 tablet by mouth daily.     hydrochlorothiazide (HYDRODIURIL) 25 MG tablet TAKE 1 TABLET DAILY 90 tablet 3   hydrocortisone 2.5 % cream as needed.     insulin aspart (FIASP FLEXTOUCH) 100 UNIT/ML FlexTouch Pen INJECT 5-8 UNITS INTO THE SKIN DAILY BEFORE SUPPER. 15 mL 3   insulin glargine (LANTUS SOLOSTAR) 100 UNIT/ML Solostar Pen Inject 30 Units into the skin daily. 30 mL 3   Insulin Pen Needle (BD PEN NEEDLE NANO 2ND GEN) 32G X 4 MM MISC USE TO TEST 4x A DAY 400 each 3   ivabradine (CORLANOR) 5 MG TABS tablet Take 1 tablet (5 mg total) by mouth 2 (two) times daily with a meal. 180 tablet 1   metFORMIN (GLUCOPHAGE) 1000 MG tablet Take 1 tablet (1,000 mg total) by mouth 2 (two) times daily with a meal. 180 tablet 3   OneTouch Delica Lancets 33G MISC Use 3x a day 200 each 3   ONETOUCH VERIO test strip USE TO CHECK BLOOD SUGAR   TWO TIMES A DAY 100 strip 7   OVER THE COUNTER MEDICATION Magnesium     ramipril (ALTACE) 5 MG capsule TAKE 2 CAPSULES BY MOUTH IN THE MORNING AND 1 CAPSULE IN THE EVENING. 270 capsule 3   Semaglutide, 2 MG/DOSE, (OZEMPIC, 2 MG/DOSE,) 8 MG/3ML SOPN INJECT 2 MG INTO THE SKIN ONCE A WEEK. INSURANCE PAYS FOR 1 MONTH 3 mL 11   spironolactone (ALDACTONE) 25 MG tablet TAKE 1/2 TABLET BY MOUTH EVERY DAY 45 tablet 0   tirzepatide (MOUNJARO) 7.5 MG/0.5ML Pen INJECT 7.5 MG SUBCUTANEOUSLY WEEKLY (Patient taking differently: INJECT 7.5 MG SUBCUTANEOUSLY WEEKLY  Has if can't get Ozempic) 2 mL 3   No current facility-administered medications for this visit.    LABS/IMAGING: No results found for this or any previous visit (from the past 48 hours). No results found.  VITALS: BP 124/66 (BP Location: Left Arm, Patient  Position: Sitting, Cuff Size: Large)   Pulse 65   Ht 5\' 3"  (1.6 m)   Wt 205 lb (93 kg)   LMP 10/01/2020   SpO2 97%   BMI 36.31 kg/m   EXAM: General appearance: alert and no distress Neck: no carotid bruit, no JVD and thyroid not enlarged, symmetric, no tenderness/mass/nodules Lungs: clear to auscultation bilaterally Heart: regular rate and rhythm, S1, S2 normal, no murmur, click, rub or gallop Abdomen: soft, non-tender; bowel sounds normal; no masses,  no organomegaly and obese Extremities: extremities normal, atraumatic, no cyanosis or edema Pulses: 2+ and symmetric Skin: Skin color, texture, turgor normal. No rashes or lesions Neurologic: Grossly normal Psych: Pleasant  EKG: EKG Interpretation Date/Time:  Friday October 12 2023 09:06:23 EDT Ventricular Rate:  65 PR Interval:  152 QRS Duration:  96 QT Interval:  432 QTC Calculation: 449 R Axis:   -46  Text Interpretation: Normal sinus rhythm Left anterior fascicular block Cannot rule out Inferior infarct (masked by fascicular block?) , age undetermined  Possible anterolateral infarct vs poor R wave progression , age undetermined When compared with ECG of 23-Jan-2002 07:34, there is poor R wave progression anteriorly Confirmed by Zoila Shutter [phone]) on 10/12/2023 9:09:23 AM    ASSESSMENT: Nonischemic cardiomyopathy, EF 40-45% -> 50-55% (04/2017) - NYHA class I symptoms, LVEF normalized to 55 to 60% (07/2021) Inappropriate sinus tachycardia History of pulmonary embolism Diabetes type 2 Hypertension  Obesity  PLAN: 1.    Ms. Kerchner seems to be doing well without any heart failure symptoms.  Her heart rate is actually well-controlled on ivabradine.  Blood pressure is at goal.  Her A1c is a little lower now at 9.2%.  She is using continuous glucose monitor and is on the GLP-1 agonist.  Hopefully her numbers will continue to come down.  She needs refills on her medicines today which we will provide.  Plan follow-up with me  per her request in 1 year or sooner as necessary.  Chrystie Nose, MD, [facility], FACP  [place]  [facility]  Medical Director of the [facility] Lipid [facility]  Cardiovascular Risk Reduction Clinic Diplomate of the American Board of Clinical Lipidology Attending Cardiologist  Direct Dial: [phone]  Fax: [phone]  Website:  [email or web address] Pe 10/12/2023, 9:09 AM

## 2023-10-12 NOTE — Patient Instructions (Signed)
 Medication Instructions:  Your physician recommends that you continue on your current medications as directed. Please refer to the Current Medication list given to you today.    *If you need a refill on your cardiac medications before your next appointment, please call your pharmacy*   Lab Work: NONE    If you have labs (blood work) drawn today and your tests are completely normal, you will receive your results only by: MyChart Message (if you have MyChart) OR A paper copy in the mail If you have any lab test that is abnormal or we need to change your treatment, we will call you to review the results.   Testing/Procedures: NONE    Follow-Up: At [facility], you and your health needs are our priority.  As part of our continuing mission to provide you with exceptional heart care, we have created designated Provider Care Teams.  These Care Teams include your primary Cardiologist (physician) and Advanced Practice Providers (APPs -  Physician Assistants and Nurse Practitioners) who all work together to provide you with the care you need, when you need it.  We recommend signing up for the patient portal called "MyChart".  Sign up information is provided on this After Visit Summary.  MyChart is used to connect with patients for Virtual Visits (Telemedicine).  Patients are able to view lab/test results, encounter notes, upcoming appointments, etc.  Non-urgent messages can be sent to your provider as well.   To learn more about what you can do with MyChart, go to [email or web address].    Your next appointment:   1 year(s)  The format for your next appointment:   In Person  Provider:   Chrystie Nose, MD    Other Instructions

## 2023-10-20 ENCOUNTER — Other Ambulatory Visit: Payer: Self-pay | Admitting: Internal Medicine

## 2023-10-20 DIAGNOSIS — E1129 Type 2 diabetes mellitus with other diabetic kidney complication: Secondary | ICD-10-CM

## 2023-10-30 ENCOUNTER — Encounter: Payer: Self-pay | Admitting: Gastroenterology

## 2023-10-31 ENCOUNTER — Encounter: Payer: Self-pay | Admitting: Gastroenterology

## 2023-10-31 ENCOUNTER — Ambulatory Visit (AMBULATORY_SURGERY_CENTER): Admitting: Gastroenterology

## 2023-10-31 VITALS — BP 122/56 | HR 61 | Temp 97.3°F | Resp 15 | Ht 63.0 in | Wt 202.0 lb

## 2023-10-31 DIAGNOSIS — K644 Residual hemorrhoidal skin tags: Secondary | ICD-10-CM | POA: Diagnosis not present

## 2023-10-31 DIAGNOSIS — Z1211 Encounter for screening for malignant neoplasm of colon: Secondary | ICD-10-CM

## 2023-10-31 DIAGNOSIS — D128 Benign neoplasm of rectum: Secondary | ICD-10-CM

## 2023-10-31 DIAGNOSIS — K648 Other hemorrhoids: Secondary | ICD-10-CM

## 2023-10-31 DIAGNOSIS — D125 Benign neoplasm of sigmoid colon: Secondary | ICD-10-CM

## 2023-10-31 DIAGNOSIS — K635 Polyp of colon: Secondary | ICD-10-CM | POA: Diagnosis not present

## 2023-10-31 DIAGNOSIS — K621 Rectal polyp: Secondary | ICD-10-CM | POA: Diagnosis not present

## 2023-10-31 MED ORDER — SODIUM CHLORIDE 0.9 % IV SOLN
500.0000 mL | INTRAVENOUS | Status: AC
Start: 2023-10-31 — End: 2023-11-01

## 2023-10-31 NOTE — Progress Notes (Unsigned)
 [facility] History and Physical   Primary Care Physician:  Aliene Beams, MD   Reason for Procedure:  Colorectal cancer screening  Plan:    Screening colonoscopy with possible interventions as needed     HPI: Laurie Small is a very pleasant 48 y.o. female here for screening colonoscopy. Denies any nausea, vomiting, abdominal pain, melena or bright red blood per rectum  The risks and benefits as well as alternatives of endoscopic procedure(s) have been discussed and reviewed. All questions answered. The patient agrees to proceed.    Past Medical History:  Diagnosis Date   Anemia    CHF (congestive heart failure) (HCC)    Clotting disorder (HCC)    Depression    Diabetes mellitus without complication (HCC)    Hyperlipidemia    NICM (nonischemic cardiomyopathy) (HCC)    2D ECHO, 12/26/2011 - EF 45-50%, left ventricle borderline dilated    Past Surgical History:  Procedure Laterality Date   c-sections     CARDIAC CATHETERIZATION  01/06/2002   Dilated nonsichemic cardiopmyopathy, EF 28%, at least 2+ angiographic mitral regurgitation   CHOLECYSTECTOMY     parthyroid removal     2001    Prior to Admission medications   Medication Sig Start Date End Date Taking? Authorizing Provider  aspirin 325 MG tablet Take 325 mg by mouth daily.   Yes [provider]  atorvastatin (LIPITOR) 40 MG tablet Take 1 tablet (40 mg total) by mouth daily. 10/12/23  Yes Hilty, Lisette Abu, MD  carvedilol (COREG) 25 MG tablet Take 1 tablet (25 mg total) by mouth 2 (two) times daily with a meal. 10/12/23  Yes Hilty, Lisette Abu, MD  Continuous Glucose Sensor (DEXCOM G7 SENSOR) MISC 3 each by Does not apply route every 30 (thirty) days. Apply 1 sensor every 10 days 06/18/23  Yes Carlus Pavlov, MD  diphenhydrAMINE (BENADRYL) 25 MG tablet Take 25 mg by mouth daily. As needed   Yes [provider]  FARXIGA 10 MG TABS tablet TAKE 1 TABLET DAILY 10/22/23  Yes Carlus Pavlov, MD  hydrochlorothiazide (HYDRODIURIL) 25 MG tablet Take 1 tablet (25 mg total) by mouth daily. 10/12/23  Yes Hilty, Lisette Abu, MD  insulin aspart (FIASP FLEXTOUCH) 100 UNIT/ML FlexTouch Pen INJECT 5-8 UNITS INTO THE SKIN DAILY BEFORE SUPPER. 10/10/23  Yes Carlus Pavlov, MD  insulin glargine (LANTUS SOLOSTAR) 100 UNIT/ML Solostar Pen Inject 30 Units into the skin daily. 09/18/23  Yes Carlus Pavlov, MD  Insulin Pen Needle (BD PEN NEEDLE NANO 2ND GEN) 32G X 4 MM MISC USE TO TEST 4x A DAY 09/18/23  Yes Carlus Pavlov, MD  ivabradine (CORLANOR) 5 MG TABS tablet TAKE 1 TABLET TWICE A DAY  WITH MEALS 10/22/23  Yes Hilty, Lisette Abu, MD  metFORMIN (GLUCOPHAGE) 1000 MG tablet Take 1 tablet (1,000 mg total) by mouth 2 (two) times daily with a meal. 09/18/23  Yes Carlus Pavlov, MD  OneTouch Delica Lancets 33G MISC Use 3x a day 07/06/22  Yes Carlus Pavlov, MD  [facility] VERIO test strip USE TO CHECK BLOOD SUGAR   TWO TIMES A DAY 11/20/22  Yes Carlus Pavlov, MD  ramipril (ALTACE) 5 MG capsule TAKE 2 CAPSULES BY MOUTH IN THE MORNING AND 1 CAPSULE IN THE EVENING. 10/12/23  Yes Hilty, Lisette Abu, MD  spironolactone (ALDACTONE) 25 MG tablet Take 0.5 tablets (12.5 mg total) by mouth daily. 10/12/23  Yes Hilty, Lisette Abu, MD  tirzepatide [facility]) 7.5 MG/0.5ML Pen INJECT 7.5 MG SUBCUTANEOUSLY WEEKLY Patient taking differently:  INJECT 7.5 MG SUBCUTANEOUSLY WEEKLY  Has if can't get Ozempic 01/26/23  Yes Carlus Pavlov, MD  allopurinol (ZYLOPRIM) 100 MG tablet Take 1 tablet by mouth daily. Patient not taking: Reported on 10/12/2023 08/24/16   [provider]  cyanocobalamin (VITAMIN B12) 500 MCG tablet Take 500 mcg by mouth daily.    [provider]  EPINEPHrine (EPIPEN 2-PAK) 0.3 mg/0.3 mL IJ SOAJ injection as directed Injection as needed for anaphylaxis reaction for 30 days 07/01/18   [provider]  ferrous sulfate 325 (65 FE) MG EC tablet Take 1 tablet by mouth  daily. Patient not taking: Reported on 10/31/2023 12/13/20   [provider]  hydrocortisone 2.5 % cream as needed. 02/12/14   [provider]  OVER THE COUNTER MEDICATION Magnesium    [provider]  Semaglutide, 2 MG/DOSE, (OZEMPIC, 2 MG/DOSE,) 8 MG/3ML SOPN INJECT 2 MG INTO THE SKIN ONCE A WEEK. INSURANCE PAYS FOR 1 MONTH 08/27/23   Carlus Pavlov, MD    Current Outpatient Medications  Medication Sig Dispense Refill   aspirin 325 MG tablet Take 325 mg by mouth daily.     atorvastatin (LIPITOR) 40 MG tablet Take 1 tablet (40 mg total) by mouth daily. 90 tablet 0   carvedilol (COREG) 25 MG tablet Take 1 tablet (25 mg total) by mouth 2 (two) times daily with a meal. 180 tablet 3   Continuous Glucose Sensor (DEXCOM G7 SENSOR) MISC 3 each by Does not apply route every 30 (thirty) days. Apply 1 sensor every 10 days 9 each 4   diphenhydrAMINE (BENADRYL) 25 MG tablet Take 25 mg by mouth daily. As needed     FARXIGA 10 MG TABS tablet TAKE 1 TABLET DAILY 90 tablet 3   hydrochlorothiazide (HYDRODIURIL) 25 MG tablet Take 1 tablet (25 mg total) by mouth daily. 90 tablet 3   insulin aspart (FIASP FLEXTOUCH) 100 UNIT/ML FlexTouch Pen INJECT 5-8 UNITS INTO THE SKIN DAILY BEFORE SUPPER. 15 mL 3   insulin glargine (LANTUS SOLOSTAR) 100 UNIT/ML Solostar Pen Inject 30 Units into the skin daily. 30 mL 3   Insulin Pen Needle (BD PEN NEEDLE NANO 2ND GEN) 32G X 4 MM MISC USE TO TEST 4x A DAY 400 each 3   ivabradine (CORLANOR) 5 MG TABS tablet TAKE 1 TABLET TWICE A DAY  WITH MEALS 180 tablet 3   metFORMIN (GLUCOPHAGE) 1000 MG tablet Take 1 tablet (1,000 mg total) by mouth 2 (two) times daily with a meal. 180 tablet 3   OneTouch Delica Lancets 33G MISC Use 3x a day 200 each 3   ONETOUCH VERIO test strip USE TO CHECK BLOOD SUGAR   TWO TIMES A DAY 100 strip 7   ramipril (ALTACE) 5 MG capsule TAKE 2 CAPSULES BY MOUTH IN THE MORNING AND 1 CAPSULE IN THE EVENING. 270 capsule 3   spironolactone  (ALDACTONE) 25 MG tablet Take 0.5 tablets (12.5 mg total) by mouth daily. 45 tablet 0   tirzepatide (MOUNJARO) 7.5 MG/0.5ML Pen INJECT 7.5 MG SUBCUTANEOUSLY WEEKLY (Patient taking differently: INJECT 7.5 MG SUBCUTANEOUSLY WEEKLY  Has if can't get Ozempic) 2 mL 3   allopurinol (ZYLOPRIM) 100 MG tablet Take 1 tablet by mouth daily. (Patient not taking: Reported on 10/12/2023)     cyanocobalamin (VITAMIN B12) 500 MCG tablet Take 500 mcg by mouth daily.     EPINEPHrine (EPIPEN 2-PAK) 0.3 mg/0.3 mL IJ SOAJ injection as directed Injection as needed for anaphylaxis reaction for 30 days  ferrous sulfate 325 (65 FE) MG EC tablet Take 1 tablet by mouth daily. (Patient not taking: Reported on 10/31/2023)     hydrocortisone 2.5 % cream as needed.     OVER THE COUNTER MEDICATION Magnesium     Semaglutide, 2 MG/DOSE, (OZEMPIC, 2 MG/DOSE,) 8 MG/3ML SOPN INJECT 2 MG INTO THE SKIN ONCE A WEEK. INSURANCE PAYS FOR 1 MONTH 3 mL 11   Current Facility-Administered Medications  Medication Dose Route Frequency Provider Last Rate Last Admin   0.9 %  sodium chloride infusion  500 mL Intravenous Continuous Carena Stream, Eleonore Chiquito, MD        Allergies as of 10/31/2023 - Review Complete 10/31/2023  Allergen Reaction Noted   Betadine [povidone iodine]  12/24/2012   Iodine  12/24/2012   Peanuts [peanut oil]  08/06/2015   Shellfish allergy  12/24/2012   Other Hives and Rash 02/18/2014    Family History  Problem Relation Age of Onset   Hypertension Mother    GI Bleed Mother    Pneumonia Maternal Grandmother    Cancer Paternal Grandmother        Breast cancer   Breast cancer Paternal Grandmother    Colon polyps Neg Hx    Colon cancer Neg Hx    Esophageal cancer Neg Hx    Rectal cancer Neg Hx    Stomach cancer Neg Hx     Social History   Socioeconomic History   Marital status: Married    Spouse name: Not on file   Number of children: Not on file   Years of education: Not on file   Highest education level:  Not on file  Occupational History   Not on file  Tobacco Use   Smoking status: Never   Smokeless tobacco: Never  Substance and Sexual Activity   Alcohol use: No   Drug use: No   Sexual activity: Yes    Birth control/protection: None  Other Topics Concern   Not on file  Social History Narrative   Not on file   Social Drivers of Health   Financial Resource Strain: Not on file  Food Insecurity: Not on file  Transportation Needs: Not on file  Physical Activity: Not on file  Stress: Not on file  Social Connections: Not on file  Intimate Partner Violence: Not on file    Review of Systems:  All other review of systems negative except as mentioned in the HPI.  Physical Exam: Vital signs in last 24 hours: BP 134/72   Pulse 65   Temp (!) 97.3 F (36.3 C)   Ht 5\' 3"  (1.6 m)   Wt 202 lb (91.6 kg)   LMP 10/01/2020   SpO2 100%   BMI 35.78 kg/m  General:   Alert, NAD Lungs:  Clear .   Heart:  Regular rate and rhythm Abdomen:  Soft, nontender and nondistended. Neuro/Psych:  Alert and cooperative. Normal mood and affect. A and O x 3  Reviewed labs, radiology imaging, old records and pertinent past GI work up  Patient is appropriate for planned procedure(s) and anesthesia in an ambulatory setting   K. Scherry Ran , MD [phone]

## 2023-10-31 NOTE — Progress Notes (Unsigned)
 Sedate, gd SR, tolerated procedure well, VSS, report to RN

## 2023-10-31 NOTE — Op Note (Signed)
 [facility] Patient Name: Laurie Small Procedure Date: 10/31/2023 12:40 PM MRN: [id number] Endoscopist: Napoleon Form , MD, [id number] Age: 48 Referring MD:  Date of Birth: [birth year] Gender: Female Account #: [id number] Procedure:                Colonoscopy Indications:              Screening for colorectal malignant neoplasm Medicines:                Monitored Anesthesia Care Procedure:                Pre-Anesthesia Assessment:                           - Prior to the procedure, a History and Physical                            was performed, and patient medications and                            allergies were reviewed. The patient's tolerance of                            previous anesthesia was also reviewed. The risks                            and benefits of the procedure and the sedation                            options and risks were discussed with the patient.                            All questions were answered, and informed consent                            was obtained. Prior Anticoagulants: The patient has                            taken no anticoagulant or antiplatelet agents. ASA                            Grade Assessment: III - A patient with severe                            systemic disease. After reviewing the risks and                            benefits, the patient was deemed in satisfactory                            condition to undergo the procedure.                           After obtaining informed consent, the colonoscope  was passed under direct vision. Throughout the                            procedure, the patient's blood pressure, pulse, and                            oxygen saturations were monitored continuously. The                            [id number]-HQ190L Colonoscope [id number] was introduced                            through the anus and advanced to the the cecum,                             identified by appendiceal orifice and ileocecal                            valve. The colonoscopy was performed without                            difficulty. The patient tolerated the procedure                            well. The quality of the bowel preparation was                            good. The ileocecal valve, appendiceal orifice, and                            rectum were photographed. Scope In: 12:56:21 PM Scope Out: 1:12:20 PM Scope Withdrawal Time: 0 hours 13 minutes 7 seconds  Total Procedure Duration: 0 hours 15 minutes 59 seconds  Findings:                 The perianal and digital rectal examinations were                            normal.                           A 7 mm polyp was found in the sigmoid colon. The                            polyp was sessile. The polyp was removed with a                            cold snare. Resection and retrieval were complete.                           A 1 mm polyp was found in the rectum. The polyp was                            sessile. The polyp was removed with a  cold biopsy                            forceps. Resection and retrieval were complete.                           Non-bleeding external and internal hemorrhoids were                            found during retroflexion. The hemorrhoids were                            small. Complications:            No immediate complications. Estimated Blood Loss:     Estimated blood loss was minimal. Impression:               - One 7 mm polyp in the sigmoid colon, removed with                            a cold snare. Resected and retrieved.                           - One 1 mm polyp in the rectum, removed with a cold                            biopsy forceps. Resected and retrieved.                           - Non-bleeding external and internal hemorrhoids. Recommendation:           - Patient has a contact number available for                            emergencies. The signs and  symptoms of potential                            delayed complications were discussed with the                            patient. Return to normal activities tomorrow.                            Written discharge instructions were provided to the                            patient.                           - Resume previous diet.                           - Continue present medications.                           - Await pathology results.                           -  Repeat colonoscopy in 5-10 years for surveillance                            based on pathology results. Napoleon Form, MD 10/31/2023 1:25:22 PM This report has been signed electronically.

## 2023-10-31 NOTE — Progress Notes (Signed)
 Called to room to assist during endoscopic procedure.  Patient ID and intended procedure confirmed with present staff. Received instructions for my participation in the procedure from the performing physician.

## 2023-10-31 NOTE — Patient Instructions (Addendum)
 YOU HAD AN ENDOSCOPIC PROCEDURE TODAY AT THE [facility]:   Refer to the procedure report that was given to you for any specific questions about what was found during the examination.  If the procedure report does not answer your questions, please call your gastroenterologist to clarify.  If you requested that your care partner not be given the details of your procedure findings, then the procedure report has been included in a sealed envelope for you to review at your convenience later.  YOU SHOULD EXPECT: Some feelings of bloating in the abdomen. Passage of more gas than usual.  Walking can help get rid of the air that was put into your GI tract during the procedure and reduce the bloating. If you had a lower endoscopy (such as a colonoscopy or flexible sigmoidoscopy) you may notice spotting of blood in your stool or on the toilet paper. If you underwent a bowel prep for your procedure, you may not have a normal bowel movement for a few days.  Please Note:  You might notice some irritation and congestion in your nose or some drainage.  This is from the oxygen used during your procedure.  There is no need for concern and it should clear up in a day or so.  SYMPTOMS TO REPORT IMMEDIATELY:  Following lower endoscopy (colonoscopy or flexible sigmoidoscopy):  Excessive amounts of blood in the stool  Significant tenderness or worsening of abdominal pains  Swelling of the abdomen that is new, acute  Fever of 100F or higher   For urgent or emergent issues, a gastroenterologist can be reached at any hour by calling [phone]. Do not use MyChart messaging for urgent concerns.    DIET:  We do recommend a small meal at first, but then you may proceed to your regular diet.  Drink plenty of fluids but you should avoid alcoholic beverages for 24 hours.  MEDICATIONS: Continue present medications.  FOLLOW UP: Await pathology results. Repeat colonoscopy in 5-10 years for surveillance based  on pathology results.  Please see handouts given to you by your recovery nurse: Polyps, Hemorrhoids.  Thank you for allowing [place] to provide for your healthcare needs today.  ACTIVITY:  You should plan to take it easy for the rest of today and you should NOT DRIVE or use heavy machinery until tomorrow (because of the sedation medicines used during the test).    FOLLOW UP: Our staff will call the number listed on your records the next business day following your procedure.  We will call around 7:15- 8:00 am to check on you and address any questions or concerns that you may have regarding the information given to you following your procedure. If we do not reach you, we will leave a message.     If any biopsies were taken you will be contacted by phone or by letter within the next 1-3 weeks.  Please call us at [phone] if you have not heard about the biopsies in 3 weeks.    SIGNATURES/CONFIDENTIALITY: You and/or your care partner have signed paperwork which will be entered into your electronic medical record.  These signatures attest to the fact that that the information above on your After Visit Summary has been reviewed and is understood.  Full responsibility of the confidentiality of this discharge information lies with you and/or your care-partner.

## 2023-11-01 ENCOUNTER — Ambulatory Visit
Admission: RE | Admit: 2023-11-01 | Discharge: 2023-11-01 | Disposition: A | Discharge status: Home / Self Care | Payer: No Typology Code available for payment source | Source: Ambulatory Visit | Attending: Family Medicine | Admitting: Family Medicine

## 2023-11-01 ENCOUNTER — Telehealth: Payer: Self-pay

## 2023-11-01 DIAGNOSIS — N6320 Unspecified lump in the left breast, unspecified quadrant: Secondary | ICD-10-CM

## 2023-11-01 NOTE — Telephone Encounter (Signed)
  Follow up Call-     10/31/2023   12:11 PM  Call back number  Post procedure Call Back phone  # [phone]  Permission to leave phone message Yes     Patient questions:  Do you have a fever, pain , or abdominal swelling? No. Pain Score  0 *  Have you tolerated food without any problems? Yes.    Have you been able to return to your normal activities? Yes.    Do you have any questions about your discharge instructions: Diet   No. Medications  No. Follow up visit  No.  Do you have questions or concerns about your Care? No.  Actions: * If pain score is 4 or above: No action needed, pain <4.

## 2023-11-05 LAB — SURGICAL PATHOLOGY

## 2023-12-04 ENCOUNTER — Other Ambulatory Visit: Payer: Self-pay | Admitting: Internal Medicine

## 2023-12-04 DIAGNOSIS — R809 Proteinuria, unspecified: Secondary | ICD-10-CM

## 2023-12-05 ENCOUNTER — Encounter: Payer: Self-pay | Admitting: Internal Medicine

## 2023-12-05 DIAGNOSIS — E1129 Type 2 diabetes mellitus with other diabetic kidney complication: Secondary | ICD-10-CM

## 2023-12-06 ENCOUNTER — Ambulatory Visit: Payer: Self-pay | Admitting: Gastroenterology

## 2023-12-06 MED ORDER — MOUNJARO 7.5 MG/0.5ML ~~LOC~~ SOAJ: SUBCUTANEOUS | 3 refills | Status: DC

## 2023-12-17 ENCOUNTER — Other Ambulatory Visit: Payer: Self-pay | Admitting: Internal Medicine

## 2023-12-18 ENCOUNTER — Other Ambulatory Visit: Payer: Self-pay

## 2023-12-18 MED ORDER — HYDROCHLOROTHIAZIDE 25 MG PO TABS: 25.0000 mg | ORAL_TABLET | Freq: Every day | ORAL | 3 refills | Status: AC

## 2024-01-03 ENCOUNTER — Other Ambulatory Visit: Payer: Self-pay

## 2024-01-03 MED ORDER — CARVEDILOL 25 MG PO TABS: 25.0000 mg | ORAL_TABLET | Freq: Two times a day (BID) | ORAL | 2 refills | Status: AC

## 2024-01-21 ENCOUNTER — Encounter: Payer: Self-pay | Admitting: Internal Medicine

## 2024-01-21 ENCOUNTER — Ambulatory Visit: Payer: No Typology Code available for payment source | Admitting: Internal Medicine

## 2024-01-21 VITALS — BP 122/70 | HR 74 | Ht 63.0 in | Wt 210.0 lb

## 2024-01-21 DIAGNOSIS — E669 Obesity, unspecified: Secondary | ICD-10-CM

## 2024-01-21 DIAGNOSIS — E785 Hyperlipidemia, unspecified: Secondary | ICD-10-CM

## 2024-01-21 DIAGNOSIS — R809 Proteinuria, unspecified: Secondary | ICD-10-CM

## 2024-01-21 DIAGNOSIS — E1129 Type 2 diabetes mellitus with other diabetic kidney complication: Secondary | ICD-10-CM

## 2024-01-21 DIAGNOSIS — Z7984 Long term (current) use of oral hypoglycemic drugs: Secondary | ICD-10-CM

## 2024-01-21 DIAGNOSIS — Z7985 Long-term (current) use of injectable non-insulin antidiabetic drugs: Secondary | ICD-10-CM

## 2024-01-21 MED ORDER — LANTUS SOLOSTAR 100 UNIT/ML ~~LOC~~ SOPN: 40.0000 [IU] | PEN_INJECTOR | Freq: Every day | SUBCUTANEOUS | Status: AC

## 2024-01-21 NOTE — Progress Notes (Signed)
 Patient ID: Laurie Small, female   DOB: 1975-11-24, 48 y.o.   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 Last visit 4 mo ago.  Interim history: No increased urination, blurry vision, nausea, chest pain.    Reviewed HbA1c levels: Lab Results  Component Value Date   HGBA1C 9.2 (A) 09/18/2023   HGBA1C 9.8 (A) 06/18/2023   HGBA1C 10.5 (A) 03/16/2023  05/17/2021: HbA1c calculated from fructosamine is 7%, much better than the directly measured HbA1c. 09/13/2020: HbA1c calculated from fructosamine is 5.9%, much better than the directly measured HbA1c, although slightly lower than expected from her log 05/23/2019: HbA1c calculated from fructosamine is 5.77%, in this case, however, discrepant with the sugars at home. 05/20/2018: HbA1c calculated from the fructosamine is 6.27%, still excellent. 01/07/2018: HbA1c calculated from fructosamine is 6.1% (great, slightly higher than before!) 03/22/2017: HbA1c calculated from fructosamine is 5.9%!  12/14/2016: HbA1c calculated from fructosamine is 6.6%. 09/14/2016: HbA1c calculated from fructosamine is MUCH lower, at 6.8%! 05/31/2015: HbA1c 10% 09/15/2014: HbA1c 9.6% 04/23/2014: HbA1c 10.9% 01/20/2014: HbA1c 12.9%  Pt is on a regimen of: - Metformin  1000 mg 2x a day, with meals - Farxiga  10 mg in am - Mounjaro  7.5 mg weekly alternating with Ozempic  2 mg weekly - FiAsp  5-8 units before a larger meal (added 08/2021) >> 6-8 units before all meals >> just in am!? >>  6 to 12 >> 10-15 >> 25-30 >> 20-25 units before all 3 meals - Basaglar  8 units >> 10 >> Lantus  20 >> 25 units daily >> 30 >> 40 units in am She was on Onglyza (too expensive).  She was on JanuMet (stopped being effective), then Januvia  >> stopped when starting Trulicity  She was on Victoza  (trial period). We  changed from Victoza  to Ozempic  08/2018. We stopped glipizide  in 10/2022.  She checks her blood sugars more than 4 times daily:    Previously:  Prev.:     Lowest sugar was 79 >> .SABRA.114 >> 99 >> 50s; it is unclear at which level she has hypoglycemia awareness Highest sugar was 363 >> 276 >> 300s >> 300s.  Glucometer: FreeStyle Ultra mini >> ReliOn  Pt's meals are: - Breakfast: fruit, yoghurt, oatmeal, eggs, grits,  >> Yoplait yogurt with granola + fruit (strawberry,) - Lunch: pasta, sandwich, salad - Dinner: meat + veggies - Snacks: 3  During the coronavirus pandemic, she had to change her job and this was a stressful. She works from home and dinner is usually late.  -+ h/o CKD, last BUN/creatinine:   Lab Results  Component Value Date   BUN 22 03/26/2020   CREATININE 1.19 (H) 03/26/2020   Lab Results  Component Value Date   GFRAA 63 03/26/2020   GFRAA 82 01/07/2018    No recent microalbuminuria: Lab Results  Component Value Date   MICRALBCREAT 24 03/16/2023  03/27/2019: ACR 21.2 ACR  (09/15/2014): 44.4 ACR (01/20/2014): 79.9 On ramipril .  -+ HL; last set of lipids: Lab Results  Component Value Date   CHOL 142 03/16/2023   HDL 56 03/16/2023   LDLCALC 68 03/16/2023   TRIG 100 03/16/2023   CHOLHDL 2.5 03/16/2023  On Lipitor 40.  - last eye exam was on 06/2023: + DR reportedly.  -No numbness and tingling in her feet.  She has heel spurs.  Latest foot exam: 03/16/2023.  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  She stopped Digoxin  >> on Corlanor now. 2D echo by her cardiologist >> EF improved to 50 to 55%.  ROS: + see HPI  I reviewed pt's medications, allergies, PMH, social hx, family hx, and changes were documented in the history of present illness. Otherwise, unchanged from my initial visit note.  Past Medical History:  Diagnosis Date   Anemia    CHF (congestive heart failure) (HCC)     Clotting disorder (HCC)    Depression    Diabetes mellitus without complication (HCC)    Hyperlipidemia    NICM (nonischemic cardiomyopathy) (HCC)    2D ECHO, 12/26/2011 - EF 45-50%, left ventricle borderline dilated   Past Surgical History:  Procedure Laterality Date   c-sections     CARDIAC CATHETERIZATION  01/06/2002   Dilated nonsichemic cardiopmyopathy, EF 28%, at least 2+ angiographic mitral regurgitation   CHOLECYSTECTOMY     parthyroid removal     2001   Social History   Social History   Marital Status: Married    Spouse Name: N/A   Number of Children: 1   Occupational History    claims benefits specialist    Social History Main Topics   Smoking status: Never Smoker    Smokeless tobacco: Not on file   Alcohol Use: No   Drug Use: No   Current Outpatient Medications on File Prior to Visit  Medication Sig Dispense Refill   allopurinol (ZYLOPRIM) 100 MG tablet Take 1 tablet by mouth daily. (Patient not taking: Reported on 10/12/2023)     aspirin 325 MG tablet Take 325 mg by mouth daily.     atorvastatin  (LIPITOR) 40 MG tablet Take 1 tablet (40 mg total) by mouth daily. 90 tablet 0   carvedilol  (COREG ) 25 MG tablet Take 1 tablet (25 mg total) by mouth 2 (two) times daily with a meal. 180 tablet 2   Continuous Glucose Sensor (DEXCOM G7 SENSOR) MISC 3 each by Does not apply route every 30 (thirty) days. Apply 1 sensor every 10 days 9 each 4   cyanocobalamin (VITAMIN B12) 500 MCG tablet Take 500 mcg by mouth daily.     diphenhydrAMINE (BENADRYL) 25 MG tablet Take 25 mg by mouth daily. As needed     EPINEPHrine (EPIPEN 2-PAK) 0.3 mg/0.3 mL IJ SOAJ injection as directed Injection as needed for anaphylaxis reaction for 30 days     FARXIGA  10 MG TABS tablet TAKE 1 TABLET DAILY 90 tablet 3   ferrous sulfate 325 (65 FE) MG EC tablet Take 1 tablet by mouth daily. (Patient not taking: Reported on 10/31/2023)     hydrochlorothiazide  (HYDRODIURIL ) 25 MG tablet Take 1 tablet (25 mg  total) by mouth daily. 90 tablet 3   hydrocortisone 2.5 % cream as needed.     insulin  aspart (FIASP  FLEXTOUCH) 100 UNIT/ML FlexTouch Pen INJECT 5-8 UNITS INTO THE SKIN DAILY BEFORE SUPPER. 15 mL 3   insulin  glargine (LANTUS  SOLOSTAR) 100 UNIT/ML Solostar Pen Inject 30 Units into the skin daily. 30 mL 3   Insulin  Pen Needle (BD PEN NEEDLE NANO 2ND GEN) 32G X 4 MM MISC USE TO TEST 4x A DAY 400 each 3   ivabradine  (CORLANOR) 5 MG TABS tablet TAKE 1 TABLET TWICE A DAY  WITH MEALS 180 tablet 3   metFORMIN  (GLUCOPHAGE ) 1000 MG tablet Take 1 tablet (1,000 mg total) by mouth 2 (two) times daily with a meal. 180 tablet 3  OneTouch Delica Lancets 33G MISC Use 3x a day 200 each 3   ONETOUCH VERIO test strip USE TO CHECK BLOOD SUGAR   TWO TIMES A DAY 100 strip 7   OVER THE COUNTER MEDICATION Magnesium     ramipril  (ALTACE ) 5 MG capsule TAKE 2 CAPSULES BY MOUTH IN THE MORNING AND 1 CAPSULE IN THE EVENING. 270 capsule 3   Semaglutide , 2 MG/DOSE, (OZEMPIC , 2 MG/DOSE,) 8 MG/3ML SOPN INJECT 2 MG INTO THE SKIN ONCE A WEEK. INSURANCE PAYS FOR 1 MONTH 3 mL 11   spironolactone  (ALDACTONE ) 25 MG tablet Take 0.5 tablets (12.5 mg total) by mouth daily. 45 tablet 0   tirzepatide  (MOUNJARO ) 7.5 MG/0.5ML Pen INJECT 7.5 MG SUBCUTANEOUSLY WEEKLY 2 mL 3   No current facility-administered medications on file prior to visit.   Allergies  Allergen Reactions   Betadine [Povidone Iodine]    Iodine    Peanuts [Peanut Oil]    Shellfish Allergy    Other Hives and Rash    Sunlight and Grass    Family History  Problem Relation Age of Onset   Hypertension Mother    GI Bleed Mother    Pneumonia Maternal Grandmother    Cancer Paternal Grandmother        Breast cancer   Breast cancer Paternal Grandmother    Colon polyps Neg Hx    Colon cancer Neg Hx    Esophageal cancer Neg Hx    Rectal cancer Neg Hx    Stomach cancer Neg Hx    PE: BP 122/70   Pulse 74   Ht 5' 3 (1.6 m)   Wt 210 lb (95.3 kg)   LMP 10/01/2020    SpO2 98%   BMI 37.20 kg/m   Wt Readings from Last 3 Encounters:  01/21/24 210 lb (95.3 kg)  10/31/23 202 lb (91.6 kg)  10/12/23 205 lb (93 kg)   Constitutional: overweight, in NAD Eyes: no exophthalmos ENT: no masses palpated in neck, no cervical lymphadenopathy Cardiovascular: RRR, No MRG Respiratory: CTA B Musculoskeletal: no deformities Skin: no rashes Neurological: no tremor with outstretched hands  ASSESSMENT: 1. DM2, insulin -dependent, uncontrolled, with complications - CKD - DR  Cardiologist: Dr Mona  2. Obesity class 2  3. HL  PLAN:  1. Patient with longstanding, uncontrolled, type 2 diabetes, on a complex antidiabetic regimen with basal/bolus insulin  regimen, weekly GLP-1 or GLP-1/GIP receptor agonist, metformin , and SGLT2 inhibitor, with still poor control.  Last HbA1c was slightly lower, at 9.2%, but still above target.  At that time, sugars were decreasing significantly overnight, after dinner, and then increasing greatly after breakfast and then drastically after dinner.  She was bolusing more Fiasp  than recommended.  We discussed about changes in her diet.  I recommended lower glycemic index foods over that she was having granola with pineapple in the morning) and also discussed about healthier meals.  We discussed about increasing Lantus  in the morning to eliminate the need for such high doses of Fiasp .  Since she had a lot of Ozempic  at home we discussed about alternating Mounjaro  with Ozempic  every other week.  I also advised her to tell the pharmacy to stop the auto refills for Ozempic . CGM interpretation: -At today's visit, we reviewed her CGM downloads: It appears that 20% of values are in target range (goal >70%), while 80% are higher than 180 (goal <25%), and 0% are lower than 70 (goal <4%).  The calculated average blood sugar is 228.  The projected HbA1c for the  next 3 months (GMI) is 8.8%. -Reviewing the CGM trends, sugars appear to be fluctuating mostly above  the target range, with more significant increases after meals.  Upon questioning, she is forgetting to take the Fiasp  before meals and sometimes takes it after the meals or when eating.  For now, we discussed about focusing on taking before meals but would not suggest an increase in dose.  I will, however, recommends to stay with Mounjaro  only, as she is now alternating between Mounjaro  and Ozempic  every other week.  I do believe that she may need a higher Mounjaro  dose but will try the 7.5 mg dose first.  More importantly, we discussed about looking at her diet and eliminate some of the high glycemic index foods, like granola.  She already eliminated pineapple.  I also made some other suggestions about improving breakfast. - I suggested to:  Patient Instructions  Please use: - Metformin  2000 mg with dinner - Farxiga  10 mg in am - FiAsp  20-25 units before all 3 meals  - Lantus  40 units in am  Try to  - Mounjaro  7.5 mg every week  Please return in 3 months.  - we checked her HbA1c: 9.7% (higher) - advised to check sugars at different times of the day - 4x a day, rotating check times - advised for yearly eye exams >> she is UTD - return to clinic in 3 months  2. Obesity class 2 - Will continue Farxiga  and switch to Mounjaro  only, which should also help with weight loss - She gained 4 pounds before last visit, previously lost 3 - Since last visit, she gained 5 pounds  3. HL - Latest lipid panel was reviewed from 02/2023: Fractions at goal: Lab Results  Component Value Date   CHOL 142 03/16/2023   HDL 56 03/16/2023   LDLCALC 68 03/16/2023   TRIG 100 03/16/2023   CHOLHDL 2.5 03/16/2023  -She continues Lipitor 40 mg daily without side effects  Lela Fendt, MD PhD [facility] Endocrinology

## 2024-01-21 NOTE — Patient Instructions (Addendum)
 Please use: - Metformin  2000 mg with dinner - Farxiga  10 mg in am - FiAsp  20-25 units before all 3 meals  - Lantus  40 units in am  Try to  - Mounjaro  7.5 mg every week  Please return in 3 months.

## 2024-01-23 ENCOUNTER — Ambulatory Visit: Payer: Self-pay | Admitting: Internal Medicine

## 2024-01-23 LAB — MICROALBUMIN / CREATININE URINE RATIO
Creatinine, Urine: 119 mg/dL (ref 20–275)
Microalb Creat Ratio: 29 mg/g{creat} (ref <30)
Microalb, Ur: 3.4 mg/dL

## 2024-01-23 LAB — LIPID PANEL W/REFLEX DIRECT LDL
Cholesterol: 155 mg/dL (ref <200)
HDL: 68 mg/dL (ref >=50)
LDL Cholesterol (Calc): 67 mg/dL
Non-HDL Cholesterol (Calc): 87 mg/dL (ref <130)
Total CHOL/HDL Ratio: 2.3 (calc) (ref <5.0)
Triglycerides: 112 mg/dL (ref <150)

## 2024-01-29 ENCOUNTER — Other Ambulatory Visit: Payer: Self-pay | Admitting: Internal Medicine

## 2024-03-10 ENCOUNTER — Other Ambulatory Visit: Payer: Self-pay | Admitting: Internal Medicine

## 2024-03-22 ENCOUNTER — Other Ambulatory Visit: Payer: Self-pay | Admitting: Internal Medicine

## 2024-04-10 ENCOUNTER — Other Ambulatory Visit: Payer: Self-pay | Admitting: Internal Medicine

## 2024-04-21 ENCOUNTER — Encounter: Payer: Self-pay | Admitting: Internal Medicine

## 2024-04-21 DIAGNOSIS — E1129 Type 2 diabetes mellitus with other diabetic kidney complication: Secondary | ICD-10-CM

## 2024-04-21 MED ORDER — MOUNJARO 7.5 MG/0.5ML ~~LOC~~ SOAJ: SUBCUTANEOUS | 3 refills | Status: DC

## 2024-05-26 ENCOUNTER — Encounter: Payer: Self-pay | Admitting: Internal Medicine

## 2024-05-26 ENCOUNTER — Ambulatory Visit: Admitting: Internal Medicine

## 2024-05-26 VITALS — BP 122/60 | HR 74 | Ht 63.0 in | Wt 208.4 lb

## 2024-05-26 DIAGNOSIS — R809 Proteinuria, unspecified: Secondary | ICD-10-CM

## 2024-05-26 DIAGNOSIS — E785 Hyperlipidemia, unspecified: Secondary | ICD-10-CM

## 2024-05-26 DIAGNOSIS — E1129 Type 2 diabetes mellitus with other diabetic kidney complication: Secondary | ICD-10-CM

## 2024-05-26 DIAGNOSIS — E669 Obesity, unspecified: Secondary | ICD-10-CM

## 2024-05-26 DIAGNOSIS — Z7985 Long-term (current) use of injectable non-insulin antidiabetic drugs: Secondary | ICD-10-CM

## 2024-05-26 DIAGNOSIS — Z794 Long term (current) use of insulin: Secondary | ICD-10-CM

## 2024-05-26 DIAGNOSIS — Z7984 Long term (current) use of oral hypoglycemic drugs: Secondary | ICD-10-CM

## 2024-05-26 LAB — POCT GLYCOSYLATED HEMOGLOBIN (HGB A1C): Hemoglobin A1C: 8 % — AB (ref 4.0–5.6)

## 2024-05-26 NOTE — Addendum Note (Signed)
 Addended by: CLEOTILDE ROLIN RAMAN on: 05/26/2024 04:22 PM   Modules accepted: Orders

## 2024-05-26 NOTE — Patient Instructions (Addendum)
 Please use: - Metformin  2000 mg with dinner - Farxiga  10 mg in am - Lantus  40 units in am - FiAsp  20-25 units before all 3 meals (for correction - do not take more than 10 units) - Mounjaro  7.5 mg every week  Please return in 3 months.

## 2024-05-26 NOTE — Progress Notes (Signed)
 Patient ID: Laurie Small, female   DOB: 03/28/1976, 48 y.o.   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 Last visit 4 mo ago.  Interim history: No increased urination, blurry vision, nausea, chest pain.   She has having some lows recently, especially in the evening.  Reviewed HbA1c levels: Lab Results  Component Value Date   HGBA1C 9.2 (A) 09/18/2023   HGBA1C 9.8 (A) 06/18/2023   HGBA1C 10.5 (A) 03/16/2023  05/17/2021: HbA1c calculated from fructosamine is 7%, much better than the directly measured HbA1c. 09/13/2020: HbA1c calculated from fructosamine is 5.9%, much better than the directly measured HbA1c, although slightly lower than expected from her log 05/23/2019: HbA1c calculated from fructosamine is 5.77%, in this case, however, discrepant with the sugars at home. 05/20/2018: HbA1c calculated from the fructosamine is 6.27%, still excellent. 01/07/2018: HbA1c calculated from fructosamine is 6.1% (great, slightly higher than before!) 03/22/2017: HbA1c calculated from fructosamine is 5.9%!  12/14/2016: HbA1c calculated from fructosamine is 6.6%. 09/14/2016: HbA1c calculated from fructosamine is MUCH lower, at 6.8%! 05/31/2015: HbA1c 10% 09/15/2014: HbA1c 9.6% 04/23/2014: HbA1c 10.9% 01/20/2014: HbA1c 12.9%  Pt is on a regimen of: - Metformin  1000 mg 2x a day, with meals - Farxiga  10 mg in am - Mounjaro  7.5 mg weekly alternating with Ozempic  2 mg weekly >>  - FiAsp  5-8 units before a larger meal (added 08/2021) >> 6-8 units before all meals >> just in am!? >>  6 to 12 >> 10-15 >> 25-30 >> 20-25 units before all 3 meals - Basaglar  8 units >> 10 >> Lantus  20 >> 25 units daily >> 30 >> 40 units in am She was on Onglyza (too expensive).  She was on JanuMet (stopped being effective), then Januvia  >>  stopped when starting Trulicity  She was on Victoza  (trial period). We changed from Victoza  to Ozempic  08/2018. We stopped glipizide  in 10/2022.  She checks her blood sugars more than 4 times daily:  Previously:   Previously:   Lowest sugar was 79 >> .SABRA.114 >> 99 >> 50s >> 40s.; it is unclear at which level she has hypoglycemia awareness Highest sugar was 363 >> .SABRASABRA 300s >> 270s.  Glucometer: FreeStyle Ultra mini >> ReliOn  Pt's meals are: - Breakfast: fruit, yoghurt, oatmeal, eggs, grits,  >> Yoplait yogurt with granola + fruit (strawberry,) - Lunch: pasta, sandwich, salad - Dinner: meat + veggies - Snacks: 3  During the coronavirus pandemic, she had to change her job and this was a stressful. She works from home and dinner is usually late.  -+ h/o CKD, last BUN/creatinine:   Lab Results  Component Value Date   BUN 22 03/26/2020   CREATININE 1.19 (H) 03/26/2020   Lab Results  Component Value Date   GFRAA 63 03/26/2020   GFRAA 82 01/07/2018    No recent microalbuminuria: Lab Results  Component Value Date   MICRALBCREAT 29 01/21/2024   MICRALBCREAT 24 03/16/2023  03/27/2019: ACR 21.2 ACR  (09/15/2014): 44.4 ACR (01/20/2014): 79.9 On ramipril .  -+ HL; last set of lipids: Lab Results  Component Value Date   CHOL 155 01/21/2024   HDL 68 01/21/2024   LDLCALC 67 01/21/2024   TRIG 112 01/21/2024   CHOLHDL 2.3 01/21/2024  On Lipitor 40.  - last eye exam was on 06/2023: + DR reportedly.  -No numbness and tingling in her feet.  She has heel  spurs.  Latest foot exam: 01/21/2024.  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  She stopped Digoxin  >> on Corlanor now. 2D echo by her cardiologist >> EF improved to 50 to 55%.  ROS: + see HPI  I reviewed pt's medications, allergies, PMH, social hx, family hx, and changes were documented in the history of present illness. Otherwise, unchanged from my initial  visit note.  Past Medical History:  Diagnosis Date   Anemia    CHF (congestive heart failure) (HCC)    Clotting disorder    Depression    Diabetes mellitus without complication (HCC)    Hyperlipidemia    NICM (nonischemic cardiomyopathy) (HCC)    2D ECHO, 12/26/2011 - EF 45-50%, left ventricle borderline dilated   Past Surgical History:  Procedure Laterality Date   c-sections     CARDIAC CATHETERIZATION  01/06/2002   Dilated nonsichemic cardiopmyopathy, EF 28%, at least 2+ angiographic mitral regurgitation   CHOLECYSTECTOMY     parthyroid removal     2001   Social History   Social History   Marital Status: Married    Spouse Name: N/A   Number of Children: 1   Occupational History    claims benefits specialist    Social History Main Topics   Smoking status: Never Smoker    Smokeless tobacco: Not on file   Alcohol Use: No   Drug Use: No   Current Outpatient Medications on File Prior to Visit  Medication Sig Dispense Refill   allopurinol (ZYLOPRIM) 100 MG tablet Take 1 tablet by mouth daily. (Patient not taking: Reported on 01/21/2024)     aspirin 325 MG tablet Take 325 mg by mouth daily.     atorvastatin  (LIPITOR) 40 MG tablet TAKE 1 TABLET BY MOUTH EVERY DAY 90 tablet 1   carvedilol  (COREG ) 25 MG tablet Take 1 tablet (25 mg total) by mouth 2 (two) times daily with a meal. 180 tablet 2   Continuous Glucose Sensor (DEXCOM G7 SENSOR) MISC 3 each by Does not apply route every 30 (thirty) days. Apply 1 sensor every 10 days 9 each 4   cyanocobalamin (VITAMIN B12) 500 MCG tablet Take 500 mcg by mouth daily.     diphenhydrAMINE (BENADRYL) 25 MG tablet Take 25 mg by mouth daily. As needed     EPINEPHrine (EPIPEN 2-PAK) 0.3 mg/0.3 mL IJ SOAJ injection as directed Injection as needed for anaphylaxis reaction for 30 days     FARXIGA  10 MG TABS tablet TAKE 1 TABLET DAILY 90 tablet 3   ferrous sulfate 325 (65 FE) MG EC tablet Take 1 tablet by mouth daily. (Patient not taking: Reported  on 01/21/2024)     hydrochlorothiazide  (HYDRODIURIL ) 25 MG tablet Take 1 tablet (25 mg total) by mouth daily. 90 tablet 3   hydrocortisone 2.5 % cream as needed.     insulin  aspart (FIASP  FLEXTOUCH) 100 UNIT/ML FlexTouch Pen INJECT UNDER SKIN UP TO 45 UNITS PER DAY AS ADVISED 45 mL 3   insulin  glargine (LANTUS  SOLOSTAR) 100 UNIT/ML Solostar Pen Inject 40 Units into the skin daily.     Insulin  Pen Needle (BD PEN NEEDLE NANO 2ND GEN) 32G X 4 MM MISC USE 3-4x a day 400 each 3   ivabradine  (CORLANOR) 5 MG TABS tablet TAKE 1 TABLET TWICE A DAY  WITH MEALS 180 tablet 3   metFORMIN  (GLUCOPHAGE ) 1000 MG tablet Take 1 tablet (1,000 mg total) by mouth  2 (two) times daily with a meal. 180 tablet 3   OneTouch Delica Lancets 33G MISC Use 3x a day 200 each 3   ONETOUCH VERIO test strip USE TO CHECK BLOOD SUGAR   TWO TIMES A DAY 100 strip 7   OVER THE COUNTER MEDICATION Magnesium     ramipril  (ALTACE ) 5 MG capsule TAKE 2 CAPSULES BY MOUTH IN THE MORNING AND 1 CAPSULE IN THE EVENING. 270 capsule 3   Semaglutide , 2 MG/DOSE, (OZEMPIC , 2 MG/DOSE,) 8 MG/3ML SOPN INJECT 2 MG INTO THE SKIN ONCE A WEEK. INSURANCE PAYS FOR 1 MONTH 3 mL 11   spironolactone  (ALDACTONE ) 25 MG tablet TAKE 1/2 TABLET BY MOUTH EVERY DAY 45 tablet 1   tirzepatide  (MOUNJARO ) 7.5 MG/0.5ML Pen INJECT 7.5 MG SUBCUTANEOUSLY WEEKLY 2 mL 3   No current facility-administered medications on file prior to visit.   Allergies  Allergen Reactions   Betadine [Povidone Iodine]    Iodine    Peanuts [Peanut Oil]    Shellfish Allergy    Other Hives and Rash    Sunlight and Grass    Family History  Problem Relation Age of Onset   Hypertension Mother    GI Bleed Mother    Pneumonia Maternal Grandmother    Cancer Paternal Grandmother        Breast cancer   Breast cancer Paternal Grandmother    Colon polyps Neg Hx    Colon cancer Neg Hx    Esophageal cancer Neg Hx    Rectal cancer Neg Hx    Stomach cancer Neg Hx    PE: BP 122/60   Pulse 74    Ht 5' 3 (1.6 m)   Wt 208 lb 6.4 oz (94.5 kg)   LMP 10/01/2020   SpO2 98%   BMI 36.92 kg/m   Wt Readings from Last 3 Encounters:  05/26/24 208 lb 6.4 oz (94.5 kg)  01/21/24 210 lb (95.3 kg)  10/31/23 202 lb (91.6 kg)   Constitutional: overweight, in NAD Eyes: no exophthalmos ENT: no masses palpated in neck, no cervical lymphadenopathy Cardiovascular: RRR, No MRG Respiratory: CTA B Musculoskeletal: no deformities Skin: no rashes Neurological: no tremor with outstretched hands  ASSESSMENT: 1. DM2, insulin -dependent, uncontrolled, with complications - CKD - DR  Cardiologist: Dr Mona  2. Obesity class 2  3. HL  PLAN:  1. Patient with longstanding, uncontrolled, type 2 diabetes, on a complex medication regimen with metformin , SGLT2 inhibitor, basal-bolus insulin , and GLP-1/GIP receptor agonist, dose increased at last visit. At that time, sugars were fluctuating mostly above the target range, with most significant increases after meals.  Upon questioning, she was forgetting to take Fiasp  before meals and was taking it occasionally after the meals, or when eating.  She was alternating between Mounjaro  and Ozempic  and I advised her to try to stay with Mounjaro  only.  We also discussed about looking at her diet and eliminate some of the high glycemic index foods, like granola.  She already eliminated pineapple. I also made some other suggestions about improving breakfast. -HbA1c at last visit was higher, increased from 9.2% to 9.7%. CGM interpretation: -At today's visit, we reviewed her CGM downloads: It appears that 87% of values are in target range (goal >70%), while 12% are higher than 180 (goal <25%), and 1% are lower than 70 (goal <4%).  The calculated average blood sugar is 139.  The projected HbA1c for the next 3 months (GMI) is 6.6%. -Reviewing the CGM trends, sugars appear to be spectacularly  improved, especially in the last 2 weeks.  Upon questioning, she mentions that she is  taking her Fiasp  more consistently before meals.  She is dropping her blood sugars too low after dinner, but upon questioning, she may take up to 20 units of insulin  for correction of the high blood sugar after dinner.  We did discuss that for certain dinners she may need to take more Fiasp  before the meal, but I recommended not to correct with more than 10 units of Fiasp  at that time.  I am hoping that at next visit we can reduce her Fiasp  doses, but for now, I advised her to continue on the same regimen, especially as some of her blood sugars are higher particularly after dinner and also with the holidays coming up. - I suggested to:  Patient Instructions  Please use: - Metformin  2000 mg with dinner - Farxiga  10 mg in am - Lantus  40 units in am - FiAsp  20-25 units before all 3 meals (for correction - do not take more than 10 units) - Mounjaro  7.5 mg every week  Please return in 3 months.  - we checked her HbA1c: 8% (improved) - advised to check sugars at different times of the day - 4x a day, rotating check times - advised for yearly eye exams >> she is UTD - return to clinic in 3-4 months  2. Obesity class 2 - She continues on Farxiga  and Mounjaro  which should both help with weight loss - Lost 5 pounds before last visit and 2 pounds since then  3. HL - Latest lipid panel was at goal: Lab Results  Component Value Date   CHOL 155 01/21/2024   HDL 68 01/21/2024   LDLCALC 67 01/21/2024   TRIG 112 01/21/2024   CHOLHDL 2.3 01/21/2024  - She continues on Lipitor 40 mg daily without side effects  Lela Fendt, MD PhD [facility] Endocrinology

## 2024-07-06 ENCOUNTER — Other Ambulatory Visit: Payer: Self-pay | Admitting: Internal Medicine

## 2024-08-13 ENCOUNTER — Other Ambulatory Visit: Payer: Self-pay | Admitting: Internal Medicine

## 2024-08-13 DIAGNOSIS — E1129 Type 2 diabetes mellitus with other diabetic kidney complication: Secondary | ICD-10-CM

## 2024-09-23 ENCOUNTER — Ambulatory Visit: Admitting: Internal Medicine
